# Patient Record
Sex: Male | Born: 1950 | Race: Black or African American | Hispanic: No | Marital: Married | Smoking: Never smoker
Health system: Southern US, Community
[De-identification: ages and names within clinical notes are randomized; demographics above are authoritative.]

## PROBLEM LIST (undated history)

## (undated) DIAGNOSIS — G4730 Sleep apnea, unspecified: Secondary | ICD-10-CM

## (undated) DIAGNOSIS — H269 Unspecified cataract: Secondary | ICD-10-CM

## (undated) DIAGNOSIS — E119 Type 2 diabetes mellitus without complications: Secondary | ICD-10-CM

## (undated) DIAGNOSIS — E559 Vitamin D deficiency, unspecified: Secondary | ICD-10-CM

## (undated) DIAGNOSIS — I10 Essential (primary) hypertension: Secondary | ICD-10-CM

## (undated) DIAGNOSIS — K219 Gastro-esophageal reflux disease without esophagitis: Secondary | ICD-10-CM

## (undated) DIAGNOSIS — T7840XA Allergy, unspecified, initial encounter: Secondary | ICD-10-CM

## (undated) DIAGNOSIS — E785 Hyperlipidemia, unspecified: Secondary | ICD-10-CM

## (undated) DIAGNOSIS — C61 Malignant neoplasm of prostate: Secondary | ICD-10-CM

## (undated) DIAGNOSIS — R609 Edema, unspecified: Secondary | ICD-10-CM

## (undated) DIAGNOSIS — E669 Obesity, unspecified: Secondary | ICD-10-CM

## (undated) DIAGNOSIS — F529 Unspecified sexual dysfunction not due to a substance or known physiological condition: Secondary | ICD-10-CM

## (undated) HISTORY — DX: Unspecified cataract: H26.9

## (undated) HISTORY — DX: Sleep apnea, unspecified: G47.30

## (undated) HISTORY — DX: Obesity, unspecified: E66.9

## (undated) HISTORY — DX: Allergy, unspecified, initial encounter: T78.40XA

## (undated) HISTORY — PX: PROSTATE BIOPSY: SHX241

## (undated) HISTORY — PX: OTHER SURGICAL HISTORY: SHX169

## (undated) HISTORY — PX: POLYPECTOMY: SHX149

## (undated) HISTORY — DX: Edema, unspecified: R60.9

## (undated) HISTORY — DX: Essential (primary) hypertension: I10

## (undated) HISTORY — DX: Gastro-esophageal reflux disease without esophagitis: K21.9

## (undated) HISTORY — DX: Type 2 diabetes mellitus without complications: E11.9

## (undated) HISTORY — PX: COLONOSCOPY: SHX174

## (undated) HISTORY — DX: Unspecified sexual dysfunction not due to a substance or known physiological condition: F52.9

## (undated) HISTORY — DX: Vitamin D deficiency, unspecified: E55.9

## (undated) HISTORY — DX: Hyperlipidemia, unspecified: E78.5

---
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
## 1985-08-06 HISTORY — PX: HERNIA REPAIR: SHX51

## 2005-03-06 DIAGNOSIS — R0681 Apnea, not elsewhere classified: Secondary | ICD-10-CM | POA: Insufficient documentation

## 2005-03-22 ENCOUNTER — Ambulatory Visit: Payer: Self-pay | Admitting: Pulmonary Disease

## 2005-04-16 ENCOUNTER — Encounter: Payer: Self-pay | Admitting: Pulmonary Disease

## 2005-04-16 ENCOUNTER — Ambulatory Visit (HOSPITAL_BASED_OUTPATIENT_CLINIC_OR_DEPARTMENT_OTHER): Admission: RE | Admit: 2005-04-16 | Discharge: 2005-04-16 | Payer: Self-pay | Admitting: Pulmonary Disease

## 2005-04-19 ENCOUNTER — Ambulatory Visit: Payer: Self-pay | Admitting: Pulmonary Disease

## 2005-05-10 ENCOUNTER — Ambulatory Visit: Payer: Self-pay | Admitting: Pulmonary Disease

## 2005-06-08 ENCOUNTER — Ambulatory Visit: Payer: Self-pay | Admitting: Pulmonary Disease

## 2005-06-18 ENCOUNTER — Encounter: Payer: Self-pay | Admitting: Pulmonary Disease

## 2005-07-03 DIAGNOSIS — G4730 Sleep apnea, unspecified: Secondary | ICD-10-CM | POA: Insufficient documentation

## 2005-07-03 DIAGNOSIS — J00 Acute nasopharyngitis [common cold]: Secondary | ICD-10-CM | POA: Insufficient documentation

## 2007-02-11 DIAGNOSIS — E119 Type 2 diabetes mellitus without complications: Secondary | ICD-10-CM | POA: Insufficient documentation

## 2007-10-22 DIAGNOSIS — K219 Gastro-esophageal reflux disease without esophagitis: Secondary | ICD-10-CM | POA: Insufficient documentation

## 2007-10-22 DIAGNOSIS — R079 Chest pain, unspecified: Secondary | ICD-10-CM | POA: Insufficient documentation

## 2007-10-22 DIAGNOSIS — R21 Rash and other nonspecific skin eruption: Secondary | ICD-10-CM | POA: Insufficient documentation

## 2007-11-10 ENCOUNTER — Ambulatory Visit: Payer: Self-pay | Admitting: Cardiology

## 2007-11-21 ENCOUNTER — Ambulatory Visit: Payer: Self-pay

## 2007-12-02 ENCOUNTER — Ambulatory Visit: Payer: Self-pay | Admitting: Cardiology

## 2008-03-12 DIAGNOSIS — E669 Obesity, unspecified: Secondary | ICD-10-CM | POA: Insufficient documentation

## 2009-02-14 ENCOUNTER — Encounter: Admission: RE | Admit: 2009-02-14 | Discharge: 2009-02-14 | Payer: Self-pay | Admitting: Internal Medicine

## 2009-02-14 DIAGNOSIS — R609 Edema, unspecified: Secondary | ICD-10-CM | POA: Insufficient documentation

## 2009-02-25 ENCOUNTER — Encounter: Payer: Self-pay | Admitting: Internal Medicine

## 2009-02-25 ENCOUNTER — Ambulatory Visit: Payer: Self-pay

## 2009-03-04 DIAGNOSIS — E559 Vitamin D deficiency, unspecified: Secondary | ICD-10-CM | POA: Insufficient documentation

## 2009-03-15 DIAGNOSIS — I10 Essential (primary) hypertension: Secondary | ICD-10-CM | POA: Insufficient documentation

## 2009-03-16 ENCOUNTER — Ambulatory Visit: Payer: Self-pay | Admitting: Pulmonary Disease

## 2009-03-16 DIAGNOSIS — G4733 Obstructive sleep apnea (adult) (pediatric): Secondary | ICD-10-CM | POA: Insufficient documentation

## 2009-03-16 DIAGNOSIS — E785 Hyperlipidemia, unspecified: Secondary | ICD-10-CM | POA: Insufficient documentation

## 2009-06-11 ENCOUNTER — Encounter: Payer: Self-pay | Admitting: Pulmonary Disease

## 2010-03-07 DIAGNOSIS — F529 Unspecified sexual dysfunction not due to a substance or known physiological condition: Secondary | ICD-10-CM | POA: Insufficient documentation

## 2010-03-07 DIAGNOSIS — Z0000 Encounter for general adult medical examination without abnormal findings: Secondary | ICD-10-CM | POA: Insufficient documentation

## 2010-03-16 ENCOUNTER — Ambulatory Visit: Payer: Self-pay | Admitting: Pulmonary Disease

## 2010-09-05 NOTE — Assessment & Plan Note (Signed)
Summary: rov for osa   Copy to:  Allena Katz Primary Provider/Referring Provider:  [facility]  CC:  Pt is here for a 1 yr f/u appt on his OSA.  Pt states he is not wearing his cpap machine because he states his wife says he no longer snores at night while sleeping. Marland Kitchen  History of Present Illness: The pt comes in today for f/u of his known osa.  He has been on cpap in the past, but has been working aggressively on weight loss.  He got to the point where the cpap pressure was keeping him awake, and stopped using the device.  He has actually lost 70 pounds since last visit, and is down to near ideal body weight.  He is sleeping well, and denies any alertness issues during the day.  His wife tells him that he no longer snores.  Current Medications (verified): 1)  Hydrochlorothiazide 25 Mg Tabs (Hydrochlorothiazide) .... Take 1 1/2 Tabs By Mouth Daily 2)  Simvastatin 40 Mg Tabs (Simvastatin) .... Take 1 Tablet By Mouth Once A Day 3)  Pantoprazole Sodium 40 Mg Tbec (Pantoprazole Sodium) .... Take 1 Tablet By Mouth Once A Day 4)  Enalapril Maleate 5 Mg Tabs (Enalapril Maleate) .... Take 1 Tablet By Mouth Once A Day 5)  Aspirin Ec Low Dose 81 Mg Tbec (Aspirin) .... Take 1 Tablet By Mouth Once A Day 6)  Metformin Hcl 500 Mg Tabs (Metformin Hcl) .... Take 1 Tablet By Mouth Two Times A Day 7)  Vitamin D .... Take 1 Tablet By Mouth Once A Day  Allergies (verified): No Known Drug Allergies  Review of Systems  The patient denies shortness of breath with activity, shortness of breath at rest, productive cough, non-productive cough, coughing up blood, chest pain, irregular heartbeats, acid heartburn, indigestion, loss of appetite, weight change, abdominal pain, difficulty swallowing, sore throat, tooth/dental problems, headaches, nasal congestion/difficulty breathing through nose, sneezing, itching, ear ache, anxiety, depression, hand/feet swelling, joint stiffness or pain, rash, change in color  of mucus, and fever.    Vital Signs:  Patient profile:   60 year old male Height:      74 inches Weight:      202.50 pounds BMI:     26.09 O2 Sat:      96 % on Room air Temp:     97.8 degrees F oral Pulse rate:   71 / minute BP sitting:   110 / 78  (left arm) Cuff size:   regular  Vitals Entered By: Arman Filter LPN (March 16, 2010 1:33 PM)  O2 Flow:  Room air CC: Pt is here for a 1 yr f/u appt on his OSA.  Pt states he is not wearing his cpap machine because he states his wife says he no longer snores at night while sleeping.  Comments Medications reviewed with patient Arman Filter LPN  March 16, 2010 1:33 PM    Physical Exam  General:  wd male in nad Nose:  no skin breakdown or pressure necrosis from cpap mask Extremities:  no edema or cyanosis Neurologic:  awake, alert, moves all 4.    Impression & Recommendations:  Problem # 1:  OBSTRUCTIVE SLEEP APNEA (ICD-327.23)  the pt has lost down to his ideal body weight, and is no longer snoring or having daytime alertness issues off cpap.  I have told him it is ok to stay off the device, and to followup with me as needed.  However, if he begins  to gain weight back, or if symptoms return, he is to call me.    Medications Added to Medication List This Visit: 1)  Simvastatin 40 Mg Tabs (Simvastatin) .... Take 1 tablet by mouth once a day 2)  Metformin Hcl 500 Mg Tabs (Metformin hcl) .... Take 1 tablet by mouth two times a day 3)  Vitamin D  .... Take 1 tablet by mouth once a day  Other Orders: Est. Patient Level III (16109)  Patient Instructions: 1)  can stop using cpap 2)  continue to work on conditioning 3)  followup with me as needed or if increased symptoms.

## 2010-12-14 DIAGNOSIS — R609 Edema, unspecified: Secondary | ICD-10-CM

## 2010-12-14 DIAGNOSIS — E559 Vitamin D deficiency, unspecified: Secondary | ICD-10-CM

## 2010-12-14 DIAGNOSIS — G4730 Sleep apnea, unspecified: Secondary | ICD-10-CM

## 2010-12-14 DIAGNOSIS — R21 Rash and other nonspecific skin eruption: Secondary | ICD-10-CM

## 2010-12-14 DIAGNOSIS — K219 Gastro-esophageal reflux disease without esophagitis: Secondary | ICD-10-CM

## 2010-12-14 DIAGNOSIS — F529 Unspecified sexual dysfunction not due to a substance or known physiological condition: Secondary | ICD-10-CM

## 2010-12-14 DIAGNOSIS — R0681 Apnea, not elsewhere classified: Secondary | ICD-10-CM

## 2010-12-14 DIAGNOSIS — E669 Obesity, unspecified: Secondary | ICD-10-CM

## 2010-12-14 DIAGNOSIS — R079 Chest pain, unspecified: Secondary | ICD-10-CM

## 2010-12-14 DIAGNOSIS — J00 Acute nasopharyngitis [common cold]: Secondary | ICD-10-CM

## 2010-12-14 DIAGNOSIS — E119 Type 2 diabetes mellitus without complications: Secondary | ICD-10-CM

## 2010-12-19 NOTE — Assessment & Plan Note (Signed)
[facility]                            CARDIOLOGY OFFICE NOTE   NAME:Ramiro, EATHON VALADE                       MRN:          [id number]  DATE:12/02/2007                            DOB:          Apr 17, 1951    Mr. Febles comes in today for further assessment of his chest discomfort.   His stress Myoview showed him to exercise for 7 minutes with some  minimal ST-segment changes.  Peak heart rate is 153.  Maximum blood  pressure was 189.  MET level achieved was 8.5.  EF 69%, normal  contractility and thickening in all areas of myocardium.  No definite  scar or ischemia.   His blood pressure is much better today.   I had a long talk with him today about risk factor modification,  particularly now clearance to walk 3 hours a week.  I have asked him to  try and lose 2 to 3 pounds and a month over the next year which would be  about 35-40 pounds.  This would make a significant improvement in his  overall health and future risk developing cardiovascular and other  vascular disease, not to mention diabetes, et Karie Soda.   All questions were answered.  Will plan on seeing him back again p.r.n.     Thomas C. Daleen Squibb, MD, [facility]  Electronically Signed    TCW/MedQ  DD: 12/02/2007  DT: 12/02/2007  Job #: [id number]   cc:   Samara Snide, MD

## 2010-12-19 NOTE — Assessment & Plan Note (Signed)
HEALTHCARE                            CARDIOLOGY OFFICE NOTE   NAME:Rollinson, AYMAN BRULL                       MRN:          [id number]  DATE:11/10/2007                            DOB:          August 09, 1950    I was asked by Dr. Chaney Born to evaluate Atthew Coutant, a delightful  60 year old gentleman with a severe episode of chest pain on October 11, 2007.   At the time, he was sitting at work and had this substernal chest  pressure that hit him hard.  He said he never had anything like this  before.  He denied any nausea, vomiting, diaphoresis or shortness of  breath.   Since that time, he had no further event.  He does have a history  gastroesophageal reflux.  He said this was much worse.   He has multiple cardiac risk factors including age, sex, obesity,  hypertension, hyperlipidemia.  He is also sedentary and rarely  exercises.  He has a sedentary job.   PAST MEDICAL HISTORY:   ALLERGIES:  He has no history of dye reaction or allergy.  He has known  drug allergies.   MEDICATIONS:  1. Hydrochlorothiazide 12.5 mg a day.  2. CPAP nightly for sleep apnea.  3. Enteric-coated aspirin 81 mg a day.  4. Simvastatin 40 mg nightly.  5. Pantoprazole 40 mg a day.  6. He has been carrying sublingual nitroglycerin which has not had to      use.   SOCIAL HISTORY:  He does not drink or smoke.  He does have a couple of  caffeinated beverages a day.  As I said, he does not exercise.  Occupation:  He is a Scientist, research (medical).  He is married.  He has no  children.   PAST SURGICAL HISTORY:  1. Cataracts at age 48.  2. Hernia on the right side repair.   FAMILY HISTORY:  Negative for premature coronary disease.  He, however,  has a very strong history of diabetes.   REVIEW OF SYSTEMS:  He has a history of gastroesophageal reflux and  sleep apnea.  He wears CPAP.  His other Review of Systems were all  questions and are negative.   PHYSICAL EXAMINATION:   GENERAL:  He is very pleasant.  He has a great  sense of humor.  VITAL SIGNS:  He is 6 feet 2 inches.  He weighs 265 pounds.  His blood  pressure 144/86, pulse 77 and regular.  HEENT:  Normocephalic, atraumatic.  PERRLA.  Extraocular movements  intact.  Sclerae are clear.  He wears glasses.  Facial symmetry is  normal.  He has a mustache. Dentition satisfactory.  NECK:  Supple.  Carotids were equal bilaterally without bruits, no JVD.  Thyroid is not enlarged.  Trachea is midline.  LUNGS:  Clear.  HEART:  Reveals a poorly appreciated PMI.  There is normal S1-S2.  ABDOMEN:  Protuberant, good bowel sounds.  No midline bruit.  No obvious  organomegaly.  EXTREMITIES:  Reveal 2+ pitting edema on the right, 1+ on the left.  There is no sign of DVT.  There is no tenderness.  There is no skin  discoloration. Pulses were 2+/4+ bilaterally symmetrical.  NEUROLOGIC:  Exam is intact.  SKIN:  Unremarkable.   Electrocardiogram shows sinus rhythm, nonspecific T-wave changes.   ASSESSMENT/PLAN:  I think Mr. Smead is at significant risk for an acute  coronary syndrome.  He probably has nonobstructive plaque, and his  symptoms are probably not related to the spell he had.  However, it was  dramatic and certainly did not resemble his reflux.   I have spent most of our time today talking about cardiac risk factor  modification.  He is on a statin.  He is on an aspirin, and his blood  pressure is probably suboptimally controlled.  His edema could be made  better by switching to furosemide or a stronger diuretic.  He probably  also needs an ACE inhibitor for blood pressure control.  I think he is  high risk for developing diabetes down the road.  We talked about an  exercise program, but before he did that, I would want to get an  exercise rest stress Myoview.  He agrees to this plan.  We will arrange  for this in the next week or so.     Thomas C. Daleen Squibb, MD, [facility]  Electronically Signed    TCW/MedQ   DD: 11/10/2007  DT: 11/10/2007  Job #: [id number]   cc:   Samara Snide, MD

## 2010-12-22 NOTE — Procedures (Signed)
Raymond [name], Raymond [name] NO.:  [id number]   MEDICAL RECORD NO.:  [id number]          PATIENT TYPE:  OUT   LOCATION:  SLEEP CENTER                 FACILITY:  [facility]   PHYSICIAN:  Marcelyn Bruins, M.D. [facility] DATE OF BIRTH:  07/25/1951   DATE OF STUDY:  04/16/2005                              NOCTURNAL POLYSOMNOGRAM   REFERRING PHYSICIAN:  Marcelyn Bruins, M.D.   DATE OF STUDY:  April 16, 2005.   INDICATION FOR STUDY:  Hypersomnia with sleep apnea. Epworth score: 12.   SLEEP ARCHITECTURE:  The patient had total sleep time of 408 minutes with  essentially no slow wave sleep and decreased REM. Sleep onset latency was  normal as was REM onset. Sleep efficiency was 93%.   RESPIRATORY DATA:  The patient was found to have a respiratory disturbance  index of 31 events per hour with moderate to very loud snoring noted. Events  were not positional.   OXYGEN DATA:  The patient had O2 desaturation as low as 70% with his  obstructive events.   CARDIAC DATA:  No clinically significant cardiac arrhythmias.   MOVEMENT/PARASOMNIAS:  No clinically significant periodic leg movements.   IMPRESSION:  Moderate obstructive sleep apnea/hypopnea syndrome. Treatment  for this degree of sleep apnea may include weight loss, upper airway  surgery, oral appliance, or C-PAP. Clinical correlation is suggested.           ______________________________  Marcelyn Bruins, M.D. [facility]  Diplomate, American Board of Sleep  Medicine     KC/MEDQ  D:  04/19/2005 15:23:45  T:  04/19/2005 21:54:32  Job:  [id number]

## 2012-04-11 ENCOUNTER — Encounter: Payer: Self-pay | Admitting: Gastroenterology

## 2012-10-28 ENCOUNTER — Other Ambulatory Visit: Payer: Self-pay | Admitting: *Deleted

## 2012-10-28 DIAGNOSIS — I10 Essential (primary) hypertension: Secondary | ICD-10-CM

## 2012-10-28 DIAGNOSIS — E119 Type 2 diabetes mellitus without complications: Secondary | ICD-10-CM

## 2012-10-28 DIAGNOSIS — E785 Hyperlipidemia, unspecified: Secondary | ICD-10-CM

## 2012-11-13 ENCOUNTER — Other Ambulatory Visit: Payer: Managed Care, Other (non HMO)

## 2012-11-13 DIAGNOSIS — E119 Type 2 diabetes mellitus without complications: Secondary | ICD-10-CM

## 2012-11-13 DIAGNOSIS — I10 Essential (primary) hypertension: Secondary | ICD-10-CM

## 2012-11-13 DIAGNOSIS — E785 Hyperlipidemia, unspecified: Secondary | ICD-10-CM

## 2012-11-14 ENCOUNTER — Encounter: Payer: Self-pay | Admitting: Geriatric Medicine

## 2012-11-14 LAB — HEMOGLOBIN A1C
Est. average glucose Bld gHb Est-mCnc: 140 mg/dL
Hgb A1c MFr Bld: 6.5 % — ABNORMAL HIGH (ref 4.8–5.6)

## 2012-11-14 LAB — COMPREHENSIVE METABOLIC PANEL
ALT: 26 IU/L (ref 0–44)
AST: 22 IU/L (ref 0–40)
Albumin/Globulin Ratio: 1.7 (ref 1.1–2.5)
Albumin: 4.5 g/dL (ref 3.6–4.8)
Alkaline Phosphatase: 67 IU/L (ref 39–117)
BUN/Creatinine Ratio: 12 (ref 10–22)
BUN: 13 mg/dL (ref 8–27)
CO2: 24 mmol/L (ref 19–28)
Calcium: 9.4 mg/dL (ref 8.6–10.2)
Chloride: 102 mmol/L (ref 97–108)
Creatinine, Ser: 1.1 mg/dL (ref 0.76–1.27)
GFR calc Af Amer: 84 mL/min/{1.73_m2} (ref >59)
GFR calc non Af Amer: 73 mL/min/{1.73_m2} (ref >59)
Globulin, Total: 2.6 g/dL (ref 1.5–4.5)
Glucose: 101 mg/dL — ABNORMAL HIGH (ref 65–99)
Potassium: 4.5 mmol/L (ref 3.5–5.2)
Sodium: 139 mmol/L (ref 134–144)
Total Bilirubin: 0.6 mg/dL (ref 0.0–1.2)
Total Protein: 7.1 g/dL (ref 6.0–8.5)

## 2012-11-14 LAB — LIPID PANEL
Chol/HDL Ratio: 3.9 ratio units (ref 0.0–5.0)
Cholesterol, Total: 114 mg/dL (ref 100–199)
HDL: 29 mg/dL — ABNORMAL LOW (ref >39)
LDL Calculated: 60 mg/dL (ref 0–99)
Triglycerides: 124 mg/dL (ref 0–149)
VLDL Cholesterol Cal: 25 mg/dL (ref 5–40)

## 2012-11-14 LAB — MICROALBUMIN / CREATININE URINE RATIO
Creatinine, Ur: 210.9 mg/dL (ref 22.0–328.0)
MICROALB/CREAT RATIO: 0.9 mg/g creat (ref 0.0–30.0)
Microalbumin, Urine: 2 ug/mL (ref 0.0–17.0)

## 2012-11-17 ENCOUNTER — Encounter: Payer: Self-pay | Admitting: Pharmacotherapy

## 2012-11-17 ENCOUNTER — Ambulatory Visit (INDEPENDENT_AMBULATORY_CARE_PROVIDER_SITE_OTHER): Payer: Managed Care, Other (non HMO) | Admitting: Pharmacotherapy

## 2012-11-17 VITALS — BP 116/78 | HR 64 | Temp 97.6°F | Wt 247.8 lb

## 2012-11-17 DIAGNOSIS — E785 Hyperlipidemia, unspecified: Secondary | ICD-10-CM

## 2012-11-17 DIAGNOSIS — I10 Essential (primary) hypertension: Secondary | ICD-10-CM

## 2012-11-17 DIAGNOSIS — E119 Type 2 diabetes mellitus without complications: Secondary | ICD-10-CM

## 2012-11-17 NOTE — Addendum Note (Signed)
Addended by: Edison Pace on: 11/17/2012 01:17 PM   Modules accepted: Orders

## 2012-11-17 NOTE — Progress Notes (Signed)
  Subjective:    Raymond [name] is a 62 y.o. male who presents for follow-up of Type 2 diabetes mellitus.   A1C:  6.5% Average BG:  106mg /dl (did bring meter to OV) No hypoglycemia. No problems with metformin  Trying to make healthy food choices.  No skipping meals.  Does eat late at times. Not much exercise.  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 Denies problems with feet.  No sores.  Checks feet daily. Denies problems with vision.  Some dryness in the morning.  Last eye exam was January 2014. Nocturia 1-2 times per night.  Review of Systems A comprehensive review of systems was negative except for: Eyes: positive for dryness in the morning Genitourinary: positive for nocturia    Objective:    BP 116/78  Pulse 64  Temp(Src) 97.6 F (36.4 C)  Wt 247 lb 12.8 oz (112.401 kg)  BMI 31.8 kg/m2  General:  alert, cooperative, appears stated age, no distress and mildly obese  Oropharynx: normal findings: lips normal without lesions and gums healthy   Eyes:  negative   Ears:  external ears normal        Lung: clear to auscultation bilaterally  Heart:  regular rate and rhythm     Extremities: extremities normal, atraumatic, no cyanosis or edema  Skin: warm and dry, no hyperpigmentation, vitiligo, or suspicious lesions     Neuro: mental status, speech normal, alert and oriented x3 and gait and station normal   Lab Review Glucose (mg/dL)  Date Value  1/47/8295 101*     CO2 (mmol/L)  Date Value  11/13/2012 24      BUN (mg/dL)  Date Value  01/24/3085 13      Creatinine, Ser (mg/dL)  Date Value  5/78/4696 1.10     A1C:  6.5% Microalbumin:  2.0 LFT:  WNL Total cholesterol:  114 LDL:  60 HDL:  29 TG:  124  Assessment:    Diabetes Mellitus type II, under excellent control.  A1C at goal <7%  BP goal is <140/80  LDL goal <100l.    Plan:    1.  Rx changes: none 2.  Continue Metformin. 3.  Counseled on nutrition goals. 4.   Counseled on need for routine exercise.  Goal is 30-45 minutes 5 x week. 5.  BP at goal <140/80 on current medication. 6.  LDL is at goal <100 on atorvastatin 80mg  daily without myalgias.   7.  HDL is low at 29 - continue Niaspan.  Explained how routine exercise may help to improve HDL levels. 8.  RTC in 6 months.  Lab prior:  A1C, CMP, FLP, microalbumin.

## 2012-11-24 ENCOUNTER — Encounter: Payer: Self-pay | Admitting: Pharmacotherapy

## 2012-12-12 ENCOUNTER — Other Ambulatory Visit: Payer: Self-pay | Admitting: *Deleted

## 2012-12-12 DIAGNOSIS — E785 Hyperlipidemia, unspecified: Secondary | ICD-10-CM

## 2012-12-12 DIAGNOSIS — I10 Essential (primary) hypertension: Secondary | ICD-10-CM

## 2012-12-12 DIAGNOSIS — E1059 Type 1 diabetes mellitus with other circulatory complications: Secondary | ICD-10-CM

## 2012-12-29 ENCOUNTER — Other Ambulatory Visit: Payer: Self-pay | Admitting: Internal Medicine

## 2012-12-30 ENCOUNTER — Other Ambulatory Visit: Payer: Self-pay | Admitting: Internal Medicine

## 2013-01-01 ENCOUNTER — Encounter: Payer: Self-pay | Admitting: Gastroenterology

## 2013-01-14 ENCOUNTER — Other Ambulatory Visit: Payer: Self-pay | Admitting: Internal Medicine

## 2013-01-14 ENCOUNTER — Other Ambulatory Visit: Payer: Self-pay | Admitting: Geriatric Medicine

## 2013-01-14 MED ORDER — METFORMIN HCL 500 MG PO TABS: ORAL_TABLET | ORAL | Status: DC

## 2013-02-20 ENCOUNTER — Other Ambulatory Visit: Payer: Self-pay

## 2013-03-23 ENCOUNTER — Other Ambulatory Visit: Payer: Self-pay

## 2013-03-25 ENCOUNTER — Ambulatory Visit: Payer: Self-pay | Admitting: Internal Medicine

## 2013-03-27 ENCOUNTER — Other Ambulatory Visit: Payer: Managed Care, Other (non HMO)

## 2013-03-30 ENCOUNTER — Other Ambulatory Visit: Payer: Managed Care, Other (non HMO)

## 2013-03-30 DIAGNOSIS — I10 Essential (primary) hypertension: Secondary | ICD-10-CM

## 2013-03-30 DIAGNOSIS — E1059 Type 1 diabetes mellitus with other circulatory complications: Secondary | ICD-10-CM

## 2013-03-30 DIAGNOSIS — E785 Hyperlipidemia, unspecified: Secondary | ICD-10-CM

## 2013-03-31 ENCOUNTER — Ambulatory Visit: Payer: Self-pay | Admitting: Internal Medicine

## 2013-03-31 LAB — BASIC METABOLIC PANEL
Calcium: 8.9 mg/dL (ref 8.6–10.2)
Creatinine, Ser: 0.93 mg/dL (ref 0.76–1.27)
GFR calc Af Amer: 102 mL/min/{1.73_m2} (ref >59)
GFR calc non Af Amer: 88 mL/min/{1.73_m2} (ref >59)
Sodium: 139 mmol/L (ref 134–144)

## 2013-03-31 LAB — HEMOGLOBIN A1C: Hgb A1c MFr Bld: 6.6 % — ABNORMAL HIGH (ref 4.8–5.6)

## 2013-04-01 ENCOUNTER — Encounter: Payer: Self-pay | Admitting: Internal Medicine

## 2013-04-01 ENCOUNTER — Ambulatory Visit (INDEPENDENT_AMBULATORY_CARE_PROVIDER_SITE_OTHER): Payer: Managed Care, Other (non HMO) | Admitting: Internal Medicine

## 2013-04-01 VITALS — BP 126/84 | HR 60 | Ht 72.0 in | Wt 250.0 lb

## 2013-04-01 DIAGNOSIS — I10 Essential (primary) hypertension: Secondary | ICD-10-CM

## 2013-04-01 DIAGNOSIS — E559 Vitamin D deficiency, unspecified: Secondary | ICD-10-CM

## 2013-04-01 DIAGNOSIS — E669 Obesity, unspecified: Secondary | ICD-10-CM

## 2013-04-01 DIAGNOSIS — E1165 Type 2 diabetes mellitus with hyperglycemia: Secondary | ICD-10-CM | POA: Insufficient documentation

## 2013-04-01 DIAGNOSIS — E785 Hyperlipidemia, unspecified: Secondary | ICD-10-CM

## 2013-04-01 DIAGNOSIS — Z23 Encounter for immunization: Secondary | ICD-10-CM

## 2013-04-01 DIAGNOSIS — E119 Type 2 diabetes mellitus without complications: Secondary | ICD-10-CM

## 2013-04-01 NOTE — Progress Notes (Signed)
Patient ID: Raymond [name], male   DOB: 12/21/1950, 62 y.o.   MRN: [id number]  Chief Complaint  Patient presents with  . Follow-up    6 month follow-up    No Known Allergies  HPI 62 y/o male patient is here for routine follow up visit. He denies any complaints. He has not taken his niacin. He has been compliant with other medications. He has gained 3 lbs since last OV. He has not been exercising recently. He tries to be careful with his meals  Review of Systems  Constitutional: Negative for fever, chills, malaise/fatigue and diaphoresis.  HENT: Negative for congestion.   Eyes: Negative for blurred vision and double vision.  Respiratory: Negative for cough and shortness of breath.   Cardiovascular: Negative for chest pain, palpitations and leg swelling.  Gastrointestinal: Negative for heartburn, nausea, vomiting, abdominal pain, diarrhea and constipation.  Genitourinary: Negative for dysuria and flank pain.  Musculoskeletal: Negative for myalgias, joint pain and falls.  Skin: Negative for itching and rash.  Neurological: Negative for seizures and headaches.  Psychiatric/Behavioral: Negative for depression and memory loss. The patient is not nervous/anxious.     Past Medical History  Diagnosis Date  . Problems with sexual function   . Unspecified vitamin D deficiency   . Edema   . Obesity, unspecified   . GERD (gastroesophageal reflux disease)   . Diabetes mellitus without complication   . Hyperlipidemia   . Hypertension   . Unspecified sleep apnea    History reviewed. No pertinent past surgical history.  Current Outpatient Prescriptions on File Prior to Visit  Medication Sig Dispense Refill  . aspirin 81 MG tablet Take 81 mg by mouth daily.        Marland Kitchen atorvastatin (LIPITOR) 80 MG tablet Take 1 tablet by mouth daily. Take 1 tablet daily      . cholecalciferol (VITAMIN D) 1000 UNITS tablet Take 1,000 Units by mouth daily. Take 1 tablet daily      . enalapril (VASOTEC) 5 MG tablet  TAKE 1 TABLET EVERY DAY FOR KIDNEYS  30 tablet  6  . glucose blood test strip 1 each 2 (two) times daily. To self monitor blood glucose       . metFORMIN (GLUCOPHAGE) 500 MG tablet Take one tablet by mouth once daily for diabetes.  30 tablet  5   No current facility-administered medications on file prior to visit.    Physical exam  BP 126/84  Pulse 60  Ht 6' (1.829 m)  Wt 250 lb (113.399 kg)  BMI 33.9 kg/m2  SpO2 95%  gen- adult, overweight male in NAD HEENT- no pallor, no icterus, no LAD, MMM cvs- ns 1,s2, rrr respi- CTAB, no wheeze or rhonchi or crackles abdo- bs+, soft, non tender Ext- no edema, no sores/ ulcers, able to move all 4 Neuro- no focal defecit Psych- mood appropriate  Labs-  CMP     Component Value Date/Time   NA 139 03/30/2013 0807   K 4.4 03/30/2013 0807   CL 103 03/30/2013 0807   CO2 21 03/30/2013 0807   GLUCOSE 108* 03/30/2013 0807   BUN 18 03/30/2013 0807   CREATININE 0.93 03/30/2013 0807   CALCIUM 8.9 03/30/2013 0807   PROT 7.1 11/13/2012 0833   AST 22 11/13/2012 0833   ALT 26 11/13/2012 0833   ALKPHOS 67 11/13/2012 0833   BILITOT 0.6 11/13/2012 0833   GFRNONAA 88 03/30/2013 0807   GFRAA 102 03/30/2013 0807   03/30/13 a1c 6.6  Assessment/plan  Dm type 2- continue metformin. a1c at goal. uptodate with eye and foot exam. bp well controlled. ldl at goal. Continue asa and statin with enalapril  HTN- bp well controlled. Continue enalapril for now  Hyperlipidemia- continue lipitor, reviewed flp  Vit d def- continue vit d supplement  Obesity- encouraged routine exercise and weight loss

## 2013-05-16 ENCOUNTER — Other Ambulatory Visit: Payer: Self-pay | Admitting: Nurse Practitioner

## 2013-05-18 ENCOUNTER — Ambulatory Visit: Payer: Managed Care, Other (non HMO) | Admitting: Pharmacotherapy

## 2013-07-12 ENCOUNTER — Other Ambulatory Visit: Payer: Self-pay | Admitting: Internal Medicine

## 2013-07-15 ENCOUNTER — Other Ambulatory Visit: Payer: Managed Care, Other (non HMO)

## 2013-07-15 ENCOUNTER — Ambulatory Visit (INDEPENDENT_AMBULATORY_CARE_PROVIDER_SITE_OTHER): Payer: Managed Care, Other (non HMO) | Admitting: Internal Medicine

## 2013-07-15 ENCOUNTER — Encounter: Payer: Self-pay | Admitting: Internal Medicine

## 2013-07-15 VITALS — BP 124/82 | HR 63 | Temp 97.1°F | Wt 248.0 lb

## 2013-07-15 DIAGNOSIS — E119 Type 2 diabetes mellitus without complications: Secondary | ICD-10-CM

## 2013-07-15 DIAGNOSIS — M79606 Pain in leg, unspecified: Secondary | ICD-10-CM | POA: Insufficient documentation

## 2013-07-15 DIAGNOSIS — I10 Essential (primary) hypertension: Secondary | ICD-10-CM

## 2013-07-15 DIAGNOSIS — M79609 Pain in unspecified limb: Secondary | ICD-10-CM

## 2013-07-15 DIAGNOSIS — M79605 Pain in left leg: Secondary | ICD-10-CM

## 2013-07-15 DIAGNOSIS — E785 Hyperlipidemia, unspecified: Secondary | ICD-10-CM

## 2013-07-15 MED ORDER — IBUPROFEN 600 MG PO TABS: 600.0000 mg | ORAL_TABLET | Freq: Four times a day (QID) | ORAL | Status: DC

## 2013-07-15 NOTE — Progress Notes (Signed)
Patient ID: Raymond [name], male   DOB: 07-12-1951, 62 y.o.   MRN: [id number]  Chief Complaint  Patient presents with  . Acute Visit    knee pain   No Known Allergies  HPI 62 y/o male patient is here for acute visit. He has been having pain around his left knee for 1-2 weeks. He does not remember any trauma or fall. He does recall twisting his left leg few week back. The pain is intermittent, more with walking or after moving his leg after a long rest period. Denies any swelling or redness around knee Pain is non radiating Tylenol extra strength one dose taken yesterday helped some No other meds tried Compliant with other meds  ROS No fever or chills No focal weakness No numbness or tingling Able to bear weight  Past Medical History  Diagnosis Date  . Problems with sexual function   . Unspecified vitamin D deficiency   . Edema   . Obesity, unspecified   . GERD (gastroesophageal reflux disease)   . Diabetes mellitus without complication   . Hyperlipidemia   . Hypertension   . Unspecified sleep apnea    Current Outpatient Prescriptions on File Prior to Visit  Medication Sig Dispense Refill  . aspirin 81 MG tablet Take 81 mg by mouth daily.        Marland Kitchen atorvastatin (LIPITOR) 80 MG tablet TAKE 1 TABLET BY MOUTH DAILY AT BEDTIME  30 tablet  6  . cholecalciferol (VITAMIN D) 1000 UNITS tablet Take 1,000 Units by mouth daily. Take 1 tablet daily      . enalapril (VASOTEC) 5 MG tablet TAKE 1 TABLET EVERY DAY FOR KIDNEYS  30 tablet  6  . glucose blood test strip 1 each. Check blood sugar 2-3 x weekly      . metFORMIN (GLUCOPHAGE) 500 MG tablet TAKE ONE TABLET BY MOUTH ONCE DAILY FOR DIABETES.  30 tablet  0   No current facility-administered medications on file prior to visit.    Physical exam BP 124/82  Pulse 63  Temp(Src) 97.1 F (36.2 C) (Oral)  Wt 248 lb (112.492 kg)  SpO2 96%  General- adult male in no acute distress Head- atraumatic, normocephalic Eyes- PERRLA, EOMI, no  pallor, no icterus Cardiovascular- normal s1,s2, no murmurs/ rubs/ gallops Respiratory- bilateral clear to auscultation, no wheeze, no rhonchi, no crackles Abdomen- bowel sounds present, soft, non tender Musculoskeletal- able to move all 4 extremities, limping gait, has pain in quadriceps medially with extension. Drawer test negative. Valgus and varus test negative. No joint line tenderness. No patellar tenderness. mcmurray negative.  Neurological- no focal deficit, normal reflexes, normal muscle strength, normal sensation to fine touch and vibration Psychiatry- alert and oriented to person, place and time, normal mood and affect  Assessment/plan  Left leg musculoskeletal pain- likely has knee extensor tendinitis- will have him take rest, elevated leg, use ice pack prn and ibuprofen 600 mg q8h x 5 days and then prn to help with the inflammation. Reassess if no improvement  htn- bp stable, continue current regimen, monitor clincially

## 2013-07-16 LAB — LIPID PANEL
HDL: 27 mg/dL — ABNORMAL LOW (ref >39)
Triglycerides: 100 mg/dL (ref 0–149)

## 2013-07-16 LAB — COMPREHENSIVE METABOLIC PANEL
ALT: 29 IU/L (ref 0–44)
AST: 22 IU/L (ref 0–40)
CO2: 17 mmol/L — ABNORMAL LOW (ref 18–29)
Calcium: 9.2 mg/dL (ref 8.6–10.2)
Creatinine, Ser: 0.95 mg/dL (ref 0.76–1.27)
Glucose: 103 mg/dL — ABNORMAL HIGH (ref 65–99)
Potassium: 4.5 mmol/L (ref 3.5–5.2)
Sodium: 141 mmol/L (ref 134–144)
Total Protein: 6.7 g/dL (ref 6.0–8.5)

## 2013-07-16 LAB — HEMOGLOBIN A1C
Est. average glucose Bld gHb Est-mCnc: 137 mg/dL
Hgb A1c MFr Bld: 6.4 % — ABNORMAL HIGH (ref 4.8–5.6)

## 2013-07-21 ENCOUNTER — Other Ambulatory Visit: Payer: Managed Care, Other (non HMO)

## 2013-07-28 ENCOUNTER — Ambulatory Visit (INDEPENDENT_AMBULATORY_CARE_PROVIDER_SITE_OTHER): Payer: Managed Care, Other (non HMO) | Admitting: Internal Medicine

## 2013-07-28 VITALS — BP 118/80 | HR 68 | Temp 96.6°F | Wt 251.4 lb

## 2013-07-28 DIAGNOSIS — E785 Hyperlipidemia, unspecified: Secondary | ICD-10-CM

## 2013-07-28 DIAGNOSIS — M79606 Pain in leg, unspecified: Secondary | ICD-10-CM

## 2013-07-28 DIAGNOSIS — I10 Essential (primary) hypertension: Secondary | ICD-10-CM

## 2013-07-28 DIAGNOSIS — M79609 Pain in unspecified limb: Secondary | ICD-10-CM

## 2013-07-28 DIAGNOSIS — E119 Type 2 diabetes mellitus without complications: Secondary | ICD-10-CM

## 2013-07-28 MED ORDER — ATORVASTATIN CALCIUM 40 MG PO TABS: ORAL_TABLET | ORAL | Status: DC

## 2013-07-28 MED ORDER — ZOSTER VACCINE LIVE 19400 UNT/0.65ML ~~LOC~~ SOLR: 0.6500 mL | Freq: Once | SUBCUTANEOUS | Status: DC

## 2013-07-28 MED ORDER — TETANUS-DIPHTH-ACELL PERTUSSIS 5-2.5-18.5 LF-MCG/0.5 IM SUSP: 0.5000 mL | Freq: Once | INTRAMUSCULAR | Status: DC

## 2013-07-28 MED ORDER — METFORMIN HCL 500 MG PO TABS: 500.0000 mg | ORAL_TABLET | ORAL | Status: DC

## 2013-07-28 NOTE — Progress Notes (Signed)
Patient ID: Raymond [name], male   DOB: 10/02/1950, 62 y.o.   MRN: [id number]    Chief Complaint  Patient presents with  . Medical Managment of Chronic Issues    4 month f/u with labs printed  . Immunizations    tdap, shingles  . other    due for a repeat colonoscopy   No Known Allergies  HPI 62 y/o male patient is here for routine follow up visit. cbg in morning have been 100-112. No hypoglycemic episode He denies any complaints. He tries to be careful with his meals  Review of Systems  Constitutional: Negative for fever, chills, malaise/fatigue and diaphoresis.  HENT: Negative for congestion.   Eyes: Negative for blurred vision and double vision.  Respiratory: Negative for cough and shortness of breath.   Cardiovascular: Negative for chest pain, palpitations and leg swelling.  Gastrointestinal: Negative for heartburn, nausea, vomiting, abdominal pain, diarrhea and constipation.  Genitourinary: Negative for dysuria and flank pain.  Musculoskeletal: Negative for myalgias, joint pain and falls.  Skin: Negative for itching and rash.  Neurological: Negative for seizures and headaches.  Psychiatric/Behavioral: Negative for depression and memory loss. The patient is not nervous/anxious.   Past Medical History  Diagnosis Date  . Problems with sexual function   . Unspecified vitamin D deficiency   . Edema   . Obesity, unspecified   . GERD (gastroesophageal reflux disease)   . Diabetes mellitus without complication   . Hyperlipidemia   . Hypertension   . Unspecified sleep apnea    Past Surgical History  Procedure Laterality Date  . Cataract extraction  1964    Dr.Freed   . Hernia repair  1987    Right Side, Dr.Kaufman    Medication reviewed. See MAR  BP 118/80  Pulse 68  Temp(Src) 96.6 F (35.9 C) (Oral)  Wt 251 lb 6.4 oz (114.034 kg)  SpO2 97%   gen- adult, overweight male in NAD HEENT- no pallor, no icterus, no LAD, MMM cvs- ns 1,s2, rrr respi- CTAB, no wheeze or  rhonchi or crackles abdo- bs+, soft, non tender Ext- no edema, no sores/ ulcers, able to move all 4 Neuro- no focal defecit Psych- mood appropriate, alert and oriented  Labs- Lab Results  Component Value Date   HGBA1C 6.4* 07/15/2013    Lipid Panel     Component Value Date/Time   TRIG 100 07/15/2013 0817   HDL 27* 07/15/2013 0817   CHOLHDL 3.9 07/15/2013 0817   LDLCALC 58 07/15/2013 0817   CMP     Component Value Date/Time   NA 141 07/15/2013 0817   K 4.5 07/15/2013 0817   CL 104 07/15/2013 0817   CO2 17* 07/15/2013 0817   GLUCOSE 103* 07/15/2013 0817   BUN 18 07/15/2013 0817   CREATININE 0.95 07/15/2013 0817   CALCIUM 9.2 07/15/2013 0817   PROT 6.7 07/15/2013 0817   AST 22 07/15/2013 0817   ALT 29 07/15/2013 0817   ALKPHOS 62 07/15/2013 0817   BILITOT 0.9 07/15/2013 0817   GFRNONAA 86 07/15/2013 0817   GFRAA 99 07/15/2013 0817   Assessment/plan  Dm type 2- continue metformin but with a1c at goal, will change metformin to every other day for now. Monitor cbg. Goal a1c < 7.uptodate with eye and foot exam. bp well controlled. ldl at goal. Continue asa and statin with enalapril. check a1c  HTN- bp well controlled. Continue enalapril for now  Hyperlipidemia-  Continue lipitor and will decrease it to 40 mg daily. Check  flp prior to next visit  Musculoskeletal leg pain, unspecified laterality- imporved. Continue prn ibuprofen

## 2013-08-12 ENCOUNTER — Encounter: Payer: Self-pay | Admitting: Internal Medicine

## 2013-11-10 ENCOUNTER — Other Ambulatory Visit: Payer: Self-pay | Admitting: *Deleted

## 2013-11-10 MED ORDER — METFORMIN HCL 500 MG PO TABS: ORAL_TABLET | ORAL | Status: DC

## 2013-11-10 NOTE — Telephone Encounter (Signed)
[facility] 

## 2013-11-26 ENCOUNTER — Other Ambulatory Visit: Payer: BC Managed Care – PPO

## 2013-11-26 DIAGNOSIS — E785 Hyperlipidemia, unspecified: Secondary | ICD-10-CM

## 2013-11-26 DIAGNOSIS — E119 Type 2 diabetes mellitus without complications: Secondary | ICD-10-CM

## 2013-11-27 LAB — HEMOGLOBIN A1C
ESTIMATED AVERAGE GLUCOSE: 151 mg/dL
Hgb A1c MFr Bld: 6.9 % — ABNORMAL HIGH (ref 4.8–5.6)

## 2013-11-27 LAB — LIPID PANEL
CHOLESTEROL TOTAL: 123 mg/dL (ref 100–199)
Chol/HDL Ratio: 4.6 ratio units (ref 0.0–5.0)
HDL: 27 mg/dL — AB (ref >39)
LDL CALC: 66 mg/dL (ref 0–99)
TRIGLYCERIDES: 151 mg/dL — AB (ref 0–149)
VLDL CHOLESTEROL CAL: 30 mg/dL (ref 5–40)

## 2013-12-01 ENCOUNTER — Ambulatory Visit (INDEPENDENT_AMBULATORY_CARE_PROVIDER_SITE_OTHER): Payer: BC Managed Care – PPO | Admitting: Internal Medicine

## 2013-12-01 ENCOUNTER — Encounter: Payer: Self-pay | Admitting: Internal Medicine

## 2013-12-01 VITALS — BP 124/80 | HR 76 | Temp 98.2°F | Resp 10 | Wt 253.0 lb

## 2013-12-01 DIAGNOSIS — I10 Essential (primary) hypertension: Secondary | ICD-10-CM

## 2013-12-01 DIAGNOSIS — E785 Hyperlipidemia, unspecified: Secondary | ICD-10-CM

## 2013-12-01 DIAGNOSIS — Z23 Encounter for immunization: Secondary | ICD-10-CM

## 2013-12-01 DIAGNOSIS — E119 Type 2 diabetes mellitus without complications: Secondary | ICD-10-CM

## 2013-12-01 MED ORDER — METFORMIN HCL 500 MG PO TABS: ORAL_TABLET | ORAL | Status: DC

## 2013-12-01 NOTE — Progress Notes (Signed)
Patient ID: Raymond [name], male   DOB: [birth year], 63 y.o.   MRN: [id number]     Chief Complaint  Patient presents with  . Medical Management of Chronic Issues    4 month follow-up, discuss labs (copy printed)    No Known Allergies  HPI 63 y/o male patient is here for routine follow up visit. cbg has been 105-115 average. He has history of dm type 2, HTN, hyperlipidemia and OSA. He has come off his CPAP machine for few years now.No hypoglycemic episode He denies any complaints. He has been having sedentary lifestyle lately. He has gained few pounds.  Review of Systems   Constitutional: Negative for fever, chills, malaise/fatigue and diaphoresis. Has possible viral uri 2 weeks back mostly resolved now. HENT: Negative for congestion.    Eyes: Negative for blurred vision and double vision. Wears corrective lenses Respiratory: Negative for cough and shortness of breath   Cardiovascular: Negative for chest pain, palpitations and leg swelling.   Gastrointestinal: Negative for heartburn, nausea, vomiting, abdominal pain, diarrhea and constipation. No melena Genitourinary: Negative for dysuria and flank pain.   Musculoskeletal: Negative for myalgias, joint pain and falls.   Skin: Negative for itching and rash.   Neurological: Negative for seizures and headaches.   Psychiatric/Behavioral: Negative for depression and memory loss. The patient is not nervous/anxious.   Past Medical History  Diagnosis Date  . Problems with sexual function   . Unspecified vitamin D deficiency   . Edema   . Obesity, unspecified   . GERD (gastroesophageal reflux disease)   . Diabetes mellitus without complication   . Hyperlipidemia   . Hypertension   . Unspecified sleep apnea    Medication reviewed. See MAR  Physical exam BP 124/80  Pulse 76  Temp(Src) 98.2 F (36.8 C) (Oral)  Resp 10  Wt 253 lb (114.76 kg)  General- elderly male in no acute distress, obese Head- atraumatic, normocephalic Eyes-  PERRLA, EOMI, no pallor, no icterus, no discharge Neck- no lymphadenopathy, no thyromegaly, no jugular vein distension, no carotid bruit Nose- normal nasal mucosa, no maxillary sinus tenderness, no frontal sinus tenderness Mouth- normal mucus membrane, no oral thrush, normal oropharynx Cardiovascular- normal s1,s2, no murmurs/ rubs/ gallops, normal distal pulses Respiratory- bilateral clear to auscultation, no wheeze, no rhonchi, no crackles Abdomen- bowel sounds present, soft, non tender Musculoskeletal- able to move all 4 extremities, no spinal and paraspinal tenderness, steady gait, no use of assistive device, normal range of motion, no leg edema Neurological- no focal deficit, normal reflexes, normal muscle strength, normal sensation to fine touch and vibration Skin- warm and dry, hypertrophied right toe nails Psychiatry- alert and oriented to person, place and time, normal mood and affect  Labs- Lab Results  Component Value Date   HGBA1C 6.9* 11/26/2013   Lipid Panel     Component Value Date/Time   TRIG 151* 11/26/2013 0816   HDL 27* 11/26/2013 0816   CHOLHDL 4.6 11/26/2013 0816   LDLCALC 66 11/26/2013 0816   CMP     Component Value Date/Time   NA 141 07/15/2013 0817   K 4.5 07/15/2013 0817   CL 104 07/15/2013 0817   CO2 17* 07/15/2013 0817   GLUCOSE 103* 07/15/2013 0817   BUN 18 07/15/2013 0817   CREATININE 0.95 07/15/2013 0817   CALCIUM 9.2 07/15/2013 0817   PROT 6.7 07/15/2013 0817   AST 22 07/15/2013 0817   ALT 29 07/15/2013 0817   ALKPHOS 62 07/15/2013 0817   BILITOT 0.9 07/15/2013  [place] 07/15/2013 0817   GFRAA 99 07/15/2013 0817    Assessment/plan  1. Need for Tdap vaccination - Tdap vaccine greater than or equal to 7yo IM  2. HYPERTENSION Stable. cotninue vasotec 5 mg daily with baby aspirin  3. DM type 2 (diabetes mellitus, type 2) With a1c rising up, will change metformin to 500 mg daily. continue asa, statin and ACEI. Normal foot exam.  Check urine microalbumin - Microalbumin/Creatinine Ratio, Urine - Hemoglobin A1c; Future - CMP; Future - CBC with Differential; Future  4. HYPERLIPIDEMIA Reviewed lipid result. Continue lipitor 40 mg daily

## 2013-12-02 LAB — MICROALBUMIN / CREATININE URINE RATIO
Creatinine, Ur: 84.2 mg/dL (ref 22.0–328.0)
MICROALB/CREAT RATIO: <3.6 mg/g creat (ref 0.0–30.0)
Microalbumin, Urine: <3 ug/mL (ref 0.0–17.0)

## 2013-12-04 ENCOUNTER — Encounter: Payer: Self-pay | Admitting: *Deleted

## 2013-12-07 ENCOUNTER — Encounter: Payer: Self-pay | Admitting: Gastroenterology

## 2014-01-01 ENCOUNTER — Other Ambulatory Visit: Payer: Self-pay | Admitting: Internal Medicine

## 2014-01-28 ENCOUNTER — Ambulatory Visit (AMBULATORY_SURGERY_CENTER): Payer: Self-pay | Admitting: *Deleted

## 2014-01-28 VITALS — Ht 72.0 in | Wt 255.8 lb

## 2014-01-28 DIAGNOSIS — Z1211 Encounter for screening for malignant neoplasm of colon: Secondary | ICD-10-CM

## 2014-01-28 MED ORDER — NA SULFATE-K SULFATE-MG SULF 17.5-3.13-1.6 GM/177ML PO SOLN: ORAL | Status: DC

## 2014-01-28 NOTE — Progress Notes (Signed)
No egg or soy allergy  Pt denies taking diet medications  No anesthesia problems and denies being told he is difficult to intubate  Registered in [place]

## 2014-01-29 ENCOUNTER — Encounter: Payer: Self-pay | Admitting: Gastroenterology

## 2014-02-09 ENCOUNTER — Encounter: Payer: Self-pay | Admitting: Gastroenterology

## 2014-02-09 ENCOUNTER — Ambulatory Visit (AMBULATORY_SURGERY_CENTER): Payer: BC Managed Care – PPO | Admitting: Gastroenterology

## 2014-02-09 VITALS — BP 107/75 | HR 61 | Temp 96.3°F | Resp 18 | Ht 72.0 in | Wt 255.0 lb

## 2014-02-09 DIAGNOSIS — D126 Benign neoplasm of colon, unspecified: Secondary | ICD-10-CM

## 2014-02-09 DIAGNOSIS — Z1211 Encounter for screening for malignant neoplasm of colon: Secondary | ICD-10-CM

## 2014-02-09 MED ORDER — SODIUM CHLORIDE 0.9 % IV SOLN: 500.0000 mL | INTRAVENOUS | Status: DC

## 2014-02-09 NOTE — Op Note (Signed)
[place]  [address] [place], [address]   COLONOSCOPY PROCEDURE REPORT  PATIENT: Raymond [name], Raymond [name]  MR#: [id number] BIRTHDATE: [birth year] , 38  yrs. old GENDER: Male ENDOSCOPIST: Inda Castle, MD REFERRED BY: PROCEDURE DATE:  02/09/2014 PROCEDURE:   Colonoscopy with cold biopsy polypectomy First Screening Colonoscopy - Avg.  risk and is 50 yrs.  old or older - No.  Prior Negative Screening - Now for repeat screening. 10 or more years since last screening  History of Adenoma - Now for follow-up colonoscopy & has been > or = to 3 yrs.  N/A  Polyps Removed Today? Yes. ASA CLASS:   Class II INDICATIONS:Average risk patient for colon cancer. MEDICATIONS: MAC sedation, administered by CRNA and Propofol (Diprivan) 270 mg IV  DESCRIPTION OF PROCEDURE:   After the risks benefits and alternatives of the procedure were thoroughly explained, informed consent was obtained.  A digital rectal exam revealed no abnormalities of the rectum.   The LB CF-H180AL Loaner [id number] endoscope was introduced through the anus and advanced to the cecum, which was identified by both the appendix and ileocecal valve. No adverse events experienced.   The quality of the prep was excellent using Suprep  The instrument was then slowly withdrawn as the colon was fully examined.      COLON FINDINGS: A sessile polyp measuring 2 mm in size was found at the cecum.  A polypectomy was performed with cold forceps.   The colon was otherwise normal.  There was no diverticulosis, inflammation, polyps or cancers unless previously stated. Retroflexed views revealed no abnormalities. The time to cecum=3 minutes 48 seconds.  Withdrawal time=9 minutes 55 seconds.  The scope was withdrawn and the procedure completed. COMPLICATIONS: There were no complications.  ENDOSCOPIC IMPRESSION: 1.   Sessile polyp measuring 2 mm in size was found at the cecum; polypectomy was performed with cold forceps 2.    The colon was otherwise normal  RECOMMENDATIONS: If the polyp(s) removed today are proven to be adenomatous (pre-cancerous) polyps, you will need a repeat colonoscopy in 5 years.  Otherwise you should continue to follow colorectal cancer screening guidelines for "routine risk" patients with colonoscopy in 10 years.  You will receive a letter within 1-2 weeks with the results of your biopsy as well as final recommendations.  Please call my office if you have not received a letter after 3 weeks.   eSigned:  Inda Castle, MD 02/09/2014 9:27 AM   cc: Almyra Deforest, MD   PATIENT NAME:  Raymond [name], Raymond [name] MR#: [id number]

## 2014-02-09 NOTE — Progress Notes (Signed)
Called to room to assist during endoscopic procedure.  Patient ID and intended procedure confirmed with present staff. Received instructions for my participation in the procedure from the performing physician.  

## 2014-02-09 NOTE — Patient Instructions (Signed)
YOU HAD AN ENDOSCOPIC PROCEDURE TODAY AT THE [facility]: Refer to the procedure report that was given to you for any specific questions about what was found during the examination.  If the procedure report does not answer your questions, please call your gastroenterologist to clarify.  If you requested that your care partner not be given the details of your procedure findings, then the procedure report has been included in a sealed envelope for you to review at your convenience later.  YOU SHOULD EXPECT: Some feelings of bloating in the abdomen. Passage of more gas than usual.  Walking can help get rid of the air that was put into your GI tract during the procedure and reduce the bloating. If you had a lower endoscopy (such as a colonoscopy or flexible sigmoidoscopy) you may notice spotting of blood in your stool or on the toilet paper. If you underwent a bowel prep for your procedure, then you may not have a normal bowel movement for a few days.  DIET: Your first meal following the procedure should be a light meal and then it is ok to progress to your normal diet.  A half-sandwich or bowl of soup is an example of a good first meal.  Heavy or fried foods are harder to digest and may make you feel nauseous or bloated.  Likewise meals heavy in dairy and vegetables can cause extra gas to form and this can also increase the bloating.  Drink plenty of fluids but you should avoid alcoholic beverages for 24 hours.  ACTIVITY: Your care partner should take you home directly after the procedure.  You should plan to take it easy, moving slowly for the rest of the day.  You can resume normal activity the day after the procedure however you should NOT DRIVE or use heavy machinery for 24 hours (because of the sedation medicines used during the test).    SYMPTOMS TO REPORT IMMEDIATELY: A gastroenterologist can be reached at any hour.  During normal business hours, 8:30 AM to 5:00 PM Monday through Friday,  call [phone].  After hours and on weekends, please call the GI answering service at [phone] who will take a message and have the physician on call contact you.   Following lower endoscopy (colonoscopy or flexible sigmoidoscopy):  Excessive amounts of blood in the stool  Significant tenderness or worsening of abdominal pains  Swelling of the abdomen that is new, acute  Fever of 100F or higher  FOLLOW UP: If any biopsies were taken you will be contacted by phone or by letter within the next 1-3 weeks.  Call your gastroenterologist if you have not heard about the biopsies in 3 weeks.  Our staff will call the home number listed on your records the next business day following your procedure to check on you and address any questions or concerns that you may have at that time regarding the information given to you following your procedure. This is a courtesy call and so if there is no answer at the home number and we have not heard from you through the emergency physician on call, we will assume that you have returned to your regular daily activities without incident.  SIGNATURES/CONFIDENTIALITY: You and/or your care partner have signed paperwork which will be entered into your electronic medical record.  These signatures attest to the fact that that the information above on your After Visit Summary has been reviewed and is understood.  Full responsibility of the confidentiality of this   discharge information lies with you and/or your care-partner.    Resume medications. Information given on polyps with discharge instructions. 

## 2014-02-09 NOTE — Progress Notes (Signed)
Report to PACU, RN, vss, BBS= Clear.  

## 2014-02-10 ENCOUNTER — Telehealth: Payer: Self-pay | Admitting: *Deleted

## 2014-02-10 NOTE — Telephone Encounter (Signed)
  Follow up Call-  Call back number 02/09/2014  Post procedure Call Back phone  # [phone]  Permission to leave phone message Yes     Patient questions:  Do you have a fever, pain , or abdominal swelling? No. Pain Score  0 *  Have you tolerated food without any problems? Yes.    Have you been able to return to your normal activities? Yes.    Do you have any questions about your discharge instructions: Diet   No. Medications  No. Follow up visit  No.  Do you have questions or concerns about your Care? No.  Actions: * If pain score is 4 or above: No action needed, pain <4.

## 2014-02-16 ENCOUNTER — Encounter: Payer: Self-pay | Admitting: Gastroenterology

## 2014-02-26 ENCOUNTER — Other Ambulatory Visit: Payer: BC Managed Care – PPO

## 2014-02-26 DIAGNOSIS — E119 Type 2 diabetes mellitus without complications: Secondary | ICD-10-CM

## 2014-02-27 LAB — CBC WITH DIFFERENTIAL/PLATELET
BASOS ABS: 0 10*3/uL (ref 0.0–0.2)
Basos: 1 %
Eos: 5 %
Eosinophils Absolute: 0.2 10*3/uL (ref 0.0–0.4)
HEMATOCRIT: 41.4 % (ref 37.5–51.0)
Hemoglobin: 14.3 g/dL (ref 12.6–17.7)
IMMATURE GRANULOCYTES: 0 %
Immature Grans (Abs): 0 10*3/uL (ref 0.0–0.1)
Lymphocytes Absolute: 1.5 10*3/uL (ref 0.7–3.1)
Lymphs: 38 %
MCH: 27.8 pg (ref 26.6–33.0)
MCHC: 34.5 g/dL (ref 31.5–35.7)
MCV: 81 fL (ref 79–97)
MONOCYTES: 13 %
MONOS ABS: 0.5 10*3/uL (ref 0.1–0.9)
Neutrophils Absolute: 1.7 10*3/uL (ref 1.4–7.0)
Neutrophils Relative %: 43 %
RBC: 5.14 x10E6/uL (ref 4.14–5.80)
RDW: 14.9 % (ref 12.3–15.4)
WBC: 3.8 10*3/uL (ref 3.4–10.8)

## 2014-02-27 LAB — COMPREHENSIVE METABOLIC PANEL
ALT: 27 IU/L (ref 0–44)
AST: 22 IU/L (ref 0–40)
Albumin/Globulin Ratio: 1.9 (ref 1.1–2.5)
Albumin: 4.3 g/dL (ref 3.6–4.8)
Alkaline Phosphatase: 64 IU/L (ref 39–117)
BUN/Creatinine Ratio: 14 (ref 10–22)
BUN: 15 mg/dL (ref 8–27)
CO2: 19 mmol/L (ref 18–29)
Calcium: 9.3 mg/dL (ref 8.6–10.2)
Chloride: 101 mmol/L (ref 97–108)
Creatinine, Ser: 1.06 mg/dL (ref 0.76–1.27)
GFR calc Af Amer: 87 mL/min/{1.73_m2} (ref >59)
GFR, EST NON AFRICAN AMERICAN: 75 mL/min/{1.73_m2} (ref >59)
GLUCOSE: 106 mg/dL — AB (ref 65–99)
Globulin, Total: 2.3 g/dL (ref 1.5–4.5)
Potassium: 4.6 mmol/L (ref 3.5–5.2)
Sodium: 137 mmol/L (ref 134–144)
TOTAL PROTEIN: 6.6 g/dL (ref 6.0–8.5)
Total Bilirubin: 0.6 mg/dL (ref 0.0–1.2)

## 2014-02-27 LAB — HEMOGLOBIN A1C
Est. average glucose Bld gHb Est-mCnc: 140 mg/dL
Hgb A1c MFr Bld: 6.5 % — ABNORMAL HIGH (ref 4.8–5.6)

## 2014-03-02 ENCOUNTER — Ambulatory Visit (INDEPENDENT_AMBULATORY_CARE_PROVIDER_SITE_OTHER): Payer: BC Managed Care – PPO | Admitting: Internal Medicine

## 2014-03-02 ENCOUNTER — Encounter: Payer: Self-pay | Admitting: Internal Medicine

## 2014-03-02 VITALS — BP 122/78 | HR 67 | Temp 97.9°F | Wt 256.6 lb

## 2014-03-02 DIAGNOSIS — I10 Essential (primary) hypertension: Secondary | ICD-10-CM

## 2014-03-02 DIAGNOSIS — E119 Type 2 diabetes mellitus without complications: Secondary | ICD-10-CM

## 2014-03-02 DIAGNOSIS — E785 Hyperlipidemia, unspecified: Secondary | ICD-10-CM

## 2014-03-02 NOTE — Progress Notes (Signed)
Patient ID: Raymond [name], male   DOB: [birth year], 63 y.o.   MRN: [id number]    Chief Complaint  Patient presents with  . Follow-up    3 month f/u & discuss lab results (printed)   No Known Allergies  HPI 63 y/o male pt seen today for routine visit. cbg 121 one am otherwise 106-114. No hypoglycemic episodes He denies any complaints.  Not exercising recently Compliant with his meds  Review of Systems   Constitutional: Negative for fever, chills, malaise/fatigue and diaphoresis.   HENT: Negative for congestion.    Eyes: Negative for blurred vision and double vision.   Respiratory: Negative for cough and shortness of breath.    Cardiovascular: Negative for chest pain, palpitations and leg swelling.   Gastrointestinal: Negative for heartburn, nausea, vomiting, abdominal pain, diarrhea and constipation.   Genitourinary: Negative for dysuria and flank pain.   Musculoskeletal: Negative for myalgias, joint pain and falls.   Skin: Negative for itching and rash.   Neurological: Negative for seizures and headaches.   Psychiatric/Behavioral: Negative for depression and memory loss. The patient is not nervous/anxious.   Past Medical History  Diagnosis Date  . Problems with sexual function   . Unspecified vitamin D deficiency   . Edema   . Obesity, unspecified   . GERD (gastroesophageal reflux disease)   . Diabetes mellitus without complication   . Hyperlipidemia   . Hypertension   . Unspecified sleep apnea   . Sleep apnea     released per MD, no CPAP   Current Outpatient Prescriptions on File Prior to Visit  Medication Sig Dispense Refill  . aspirin 81 MG tablet Take 81 mg by mouth daily.        Marland Kitchen atorvastatin (LIPITOR) 40 MG tablet TAKE 1 TABLET BY MOUTH DAILY AT BEDTIME  30 tablet  6  . cholecalciferol (VITAMIN D) 1000 UNITS tablet Take 400 Units by mouth daily. Take 1 tablet daily      . enalapril (VASOTEC) 5 MG tablet TAKE 1 TABLET EVERY DAY FOR KIDNEYS  30 tablet  6  .  glucose blood test strip 1 each. Check blood sugar 2-3 x weekly      . metFORMIN (GLUCOPHAGE) 500 MG tablet Take one tablet by mouth every day to control blood sugar  30 tablet  5   No current facility-administered medications on file prior to visit.   Physical exam BP 122/78  Pulse 67  Temp(Src) 97.9 F (36.6 C) (Oral)  Wt 256 lb 9.6 oz (116.393 kg)  SpO2 96%  gen- adult, overweight male in NAD HEENT- no pallor, no icterus, no LAD, MMM cvs- ns 1,s2, rrr respi- CTAB, no wheeze or rhonchi or crackles abdo- bs+, soft, non tender Ext- no edema, no sores/ ulcers, able to move all 4 Neuro- no focal deficit, normal foot exam Psych- mood appropriate, alert and oriented   Lab Results  Component Value Date   HGBA1C 6.5* 02/26/2014    Wt Readings from Last 3 Encounters:  03/02/14 256 lb 9.6 oz (116.393 kg)  02/09/14 255 lb (115.667 kg)  01/28/14 255 lb 12.8 oz (116.03 kg)   Lipid Panel     Component Value Date/Time   TRIG 151* 11/26/2013 0816   HDL 27* 11/26/2013 0816   CHOLHDL 4.6 11/26/2013 0816   LDLCALC 66 11/26/2013 0816   CBC Latest Ref Rng 02/26/2014  WBC 3.4 - 10.8 x10E3/uL 3.8  Hemoglobin 12.6 - 17.7 g/dL 14.3  Hematocrit 37.5 - 51.0 % 41.4  Assessment/plan  1. HYPERTENSION Stable. cotninue vasotec 5 mg daily with baby aspirin - CMP; Future  2. Type 2 diabetes mellitus without complication Continue metformin  500 mg daily. continue asa, statin and ACEI. Normal foot exam. Normal urine microalbumin. a1c improved  - Hemoglobin A1c; Future - CMP; Future  3. HYPERLIPIDEMIA Continue lipitor 40 mg daily - Lipid Panel; Future

## 2014-04-17 ENCOUNTER — Other Ambulatory Visit: Payer: Self-pay | Admitting: Internal Medicine

## 2014-06-13 ENCOUNTER — Other Ambulatory Visit: Payer: Self-pay | Admitting: Internal Medicine

## 2014-07-05 ENCOUNTER — Other Ambulatory Visit: Payer: BC Managed Care – PPO

## 2014-07-05 ENCOUNTER — Other Ambulatory Visit: Payer: Self-pay | Admitting: Internal Medicine

## 2014-07-05 DIAGNOSIS — I10 Essential (primary) hypertension: Secondary | ICD-10-CM

## 2014-07-05 DIAGNOSIS — E119 Type 2 diabetes mellitus without complications: Secondary | ICD-10-CM

## 2014-07-05 DIAGNOSIS — E785 Hyperlipidemia, unspecified: Secondary | ICD-10-CM

## 2014-07-06 LAB — COMPREHENSIVE METABOLIC PANEL
ALBUMIN: 4.2 g/dL (ref 3.6–4.8)
ALT: 30 IU/L (ref 0–44)
AST: 26 IU/L (ref 0–40)
Albumin/Globulin Ratio: 1.9 (ref 1.1–2.5)
Alkaline Phosphatase: 61 IU/L (ref 39–117)
BUN/Creatinine Ratio: 14 (ref 10–22)
BUN: 14 mg/dL (ref 8–27)
CO2: 21 mmol/L (ref 18–29)
CREATININE: 0.98 mg/dL (ref 0.76–1.27)
Calcium: 9.1 mg/dL (ref 8.6–10.2)
Chloride: 100 mmol/L (ref 97–108)
GFR calc Af Amer: 95 mL/min/{1.73_m2} (ref >59)
GFR calc non Af Amer: 82 mL/min/{1.73_m2} (ref >59)
GLOBULIN, TOTAL: 2.2 g/dL (ref 1.5–4.5)
GLUCOSE: 106 mg/dL — AB (ref 65–99)
Potassium: 4.5 mmol/L (ref 3.5–5.2)
Sodium: 137 mmol/L (ref 134–144)
Total Bilirubin: 1.1 mg/dL (ref 0.0–1.2)
Total Protein: 6.4 g/dL (ref 6.0–8.5)

## 2014-07-06 LAB — LIPID PANEL
CHOL/HDL RATIO: 4.5 ratio (ref 0.0–5.0)
CHOLESTEROL TOTAL: 125 mg/dL (ref 100–199)
HDL: 28 mg/dL — AB (ref >39)
LDL CALC: 64 mg/dL (ref 0–99)
TRIGLYCERIDES: 164 mg/dL — AB (ref 0–149)
VLDL CHOLESTEROL CAL: 33 mg/dL (ref 5–40)

## 2014-07-06 LAB — HEMOGLOBIN A1C
Est. average glucose Bld gHb Est-mCnc: 146 mg/dL
Hgb A1c MFr Bld: 6.7 % — ABNORMAL HIGH (ref 4.8–5.6)

## 2014-07-07 ENCOUNTER — Ambulatory Visit: Payer: BC Managed Care – PPO | Admitting: Internal Medicine

## 2014-07-12 ENCOUNTER — Encounter: Payer: Self-pay | Admitting: Nurse Practitioner

## 2014-07-12 ENCOUNTER — Ambulatory Visit (INDEPENDENT_AMBULATORY_CARE_PROVIDER_SITE_OTHER): Payer: BC Managed Care – PPO | Admitting: *Deleted

## 2014-07-12 ENCOUNTER — Ambulatory Visit (INDEPENDENT_AMBULATORY_CARE_PROVIDER_SITE_OTHER): Payer: BC Managed Care – PPO | Admitting: Nurse Practitioner

## 2014-07-12 VITALS — BP 120/80 | HR 78 | Temp 98.1°F | Resp 18 | Ht 72.0 in | Wt 258.0 lb

## 2014-07-12 DIAGNOSIS — Z23 Encounter for immunization: Secondary | ICD-10-CM

## 2014-07-12 DIAGNOSIS — I10 Essential (primary) hypertension: Secondary | ICD-10-CM

## 2014-07-12 DIAGNOSIS — E785 Hyperlipidemia, unspecified: Secondary | ICD-10-CM

## 2014-07-12 DIAGNOSIS — E119 Type 2 diabetes mellitus without complications: Secondary | ICD-10-CM

## 2014-07-12 NOTE — Progress Notes (Signed)
Patient ID: Raymond [name], male   DOB: March 30, 1951, 63 y.o.   MRN: [id number]    PCP: Blanchie Serve, MD  No Known Allergies  Chief Complaint  Patient presents with  . Medical Management of Chronic Issues     HPI: Patient is a 63 y.o. male seen in the office today for routine follow up on DM, HTN, Obesity, hyperlipidemia. Pt reports he doing well except he has gained weight. Reports his wife makes lots of sweets.  Has not exercised as he should.  Review of Systems:  Review of Systems  Constitutional: Negative for activity change, appetite change, fatigue and unexpected weight change.  HENT: Negative for congestion and hearing loss.   Eyes: Negative.        Unsure when his last eye exam was  Respiratory: Negative for cough and shortness of breath.   Cardiovascular: Negative for chest pain, palpitations and leg swelling.  Gastrointestinal: Negative for abdominal pain, diarrhea and constipation.  Genitourinary: Negative for dysuria and difficulty urinating.  Musculoskeletal: Negative for myalgias and arthralgias.  Skin: Negative for color change and wound.  Neurological: Negative for dizziness and weakness.  Psychiatric/Behavioral: Negative for behavioral problems, confusion and agitation.    Past Medical History  Diagnosis Date  . Problems with sexual function   . Unspecified vitamin D deficiency   . Edema   . Obesity, unspecified   . GERD (gastroesophageal reflux disease)   . Diabetes mellitus without complication   . Hyperlipidemia   . Hypertension   . Unspecified sleep apnea   . Sleep apnea     released per MD, no CPAP   Past Surgical History  Procedure Laterality Date  . Cataract extraction  1964    Dr.Freed   . Hernia repair  1987    Right Side, Dr.Kaufman   . Colonoscopy     Social History:   reports that he has never smoked. He has never used smokeless tobacco. He reports that he does not drink alcohol or use illicit drugs.  Family History  Problem Relation  Age of Onset  . Colon cancer Mother   . Esophageal cancer Neg Hx   . Stomach cancer Neg Hx   . Rectal cancer Neg Hx     Medications: Patient's Medications  New Prescriptions   No medications on file  Previous Medications   ASPIRIN 81 MG TABLET    Take 81 mg by mouth daily.     ATORVASTATIN (LIPITOR) 40 MG TABLET    TAKE 1 TABLET BY MOUTH EVERY DAY AT BEDTIME   CHOLECALCIFEROL (VITAMIN D) 1000 UNITS TABLET    Take 400 Units by mouth daily. Take 1 tablet daily   DEXTROMETHORPHAN-GUAIFENESIN (MUCINEX DM) 30-600 MG PER 12 HR TABLET    Take 1 tablet by mouth 2 (two) times daily.   ENALAPRIL (VASOTEC) 5 MG TABLET    TAKE 1 TABLET EVERY DAY FOR KIDNEYS   ENALAPRIL (VASOTEC) 5 MG TABLET    TAKE 1 TABLET EVERY DAY FOR KIDNEYS   FLUTICASONE (FLONASE) 50 MCG/ACT NASAL SPRAY    Place 2 sprays into both nostrils daily.   GLUCOSE BLOOD TEST STRIP    1 each. Check blood sugar 2-3 x weekly   METFORMIN (GLUCOPHAGE) 500 MG TABLET    TAKE ONE TABLET BY MOUTH EVERY DAY TO CONTROL BLOOD SUGAR  Modified Medications   No medications on file  Discontinued Medications   No medications on file     Physical Exam:  Filed Vitals:  07/12/14 1313  BP: 120/80  Pulse: 78  Temp: 98.1 F (36.7 C)  TempSrc: Oral  Resp: 18  Height: 6' (1.829 m)  Weight: 258 lb (117.028 kg)  SpO2: 98%    Physical Exam  Constitutional: He is oriented to person, place, and time. He appears well-developed and well-nourished. No distress.  HENT:  Head: Normocephalic and atraumatic.  Mouth/Throat: Oropharynx is clear and moist. No oropharyngeal exudate.  Eyes: Conjunctivae and EOM are normal. Pupils are equal, round, and reactive to light.  Neck: Normal range of motion. Neck supple.  Cardiovascular: Normal rate, regular rhythm and normal heart sounds.   Pulmonary/Chest: Effort normal and breath sounds normal.  Abdominal: Soft. Bowel sounds are normal.  Musculoskeletal: Normal range of motion. He exhibits no edema or  tenderness.  Neurological: He is alert and oriented to person, place, and time.  Skin: Skin is warm and dry. He is not diaphoretic.  Psychiatric: He has a normal mood and affect.    Labs reviewed: Basic Metabolic Panel:  Recent Labs  07/15/13 0817 02/26/14 0808 07/05/14 0810  NA 141 137 137  K 4.5 4.6 4.5  CL 104 101 100  CO2 17* 19 21  GLUCOSE 103* 106* 106*  BUN 18 15 14   CREATININE 0.95 1.06 0.98  CALCIUM 9.2 9.3 9.1   Liver Function Tests:  Recent Labs  07/15/13 0817 02/26/14 0808 07/05/14 0810  AST 22 22 26   ALT 29 27 30   ALKPHOS 62 64 61  BILITOT 0.9 0.6 1.1  PROT 6.7 6.6 6.4   No results for input(s): LIPASE, AMYLASE in the last 8760 hours. No results for input(s): AMMONIA in the last 8760 hours. CBC:  Recent Labs  02/26/14 0808  WBC 3.8  NEUTROABS 1.7  HGB 14.3  HCT 41.4  MCV 81   Lipid Panel:  Recent Labs  07/15/13 0817 11/26/13 0816 07/05/14 0810  HDL 27* 27* 28*  LDLCALC 58 66 64  TRIG 100 151* 164*  CHOLHDL 3.9 4.6 4.5   TSH: No results for input(s): TSH in the last 8760 hours. A1C: Lab Results  Component Value Date   HGBA1C 6.7* 07/05/2014     Assessment/Plan  1. Essential hypertension -encouraged lifestyle  Modifications, conts on vasotec (mostly for renal protection) and ASA - CBC With differential/Platelet; Future  2. Type 2 diabetes mellitus without complication -Z6X still in appropriate range but worse than previous, pt also with weight gain. Discussed diet and exercise.  Will cont current medications - Hemoglobin A1c; Future - to get eye exam if he has not done so this year.  3. Hyperlipidemia -LDL at goal. Discussed labs -trig worse, discussed lifestyle modifications that would help - Lipid panel; Future - Comprehensive metabolic panel; Future  4. Obesity Discussed weight gain and need for lifestyle modifications, need to exercise and cut back on sweets.  Follow up in 6 months for EV with Bubba Camp with labs  before visit

## 2014-07-12 NOTE — Patient Instructions (Addendum)
Will get flu shot today  Work on diet and exercise 30 mins physical activity 5 days a week Doing well but to cut back on desserts and smaller portions of starches   Follow up with 6 months with blood work prior to visit for a EV with [employer]

## 2014-08-20 ENCOUNTER — Telehealth: Payer: Self-pay | Admitting: *Deleted

## 2014-08-20 NOTE — Telephone Encounter (Signed)
Patient called wanting to know the status of the FMLA Paperwork. Patient stated that he received a letter from [place] him because they didn't either receive that paperwork or it wasn't filled out completely. Please Advise.

## 2014-08-20 NOTE — Telephone Encounter (Signed)
i needed some specifics from the patient about the dates. I had asked Caren Griffins to follow on it.

## 2014-08-24 NOTE — Telephone Encounter (Signed)
I called patient to get specifics per Dr.Pandey's request Patient needs an Intermittent leave, dates should be 07/20/14-07/21/15 (1 year) for 5 days per week

## 2014-09-28 ENCOUNTER — Encounter: Payer: Self-pay | Admitting: Internal Medicine

## 2014-10-18 ENCOUNTER — Telehealth: Payer: Self-pay | Admitting: Nurse Practitioner

## 2014-10-18 NOTE — Telephone Encounter (Signed)
Raymond [name] dropped off an FMLA form to be filled out. The form was put in the rx tray.

## 2014-10-20 NOTE — Telephone Encounter (Signed)
Paperwork placed for Dr. Bubba Camp to review.

## 2014-10-30 ENCOUNTER — Other Ambulatory Visit: Payer: Self-pay | Admitting: Internal Medicine

## 2014-11-28 ENCOUNTER — Other Ambulatory Visit: Payer: Self-pay | Admitting: Internal Medicine

## 2015-01-14 ENCOUNTER — Other Ambulatory Visit: Payer: BLUE CROSS/BLUE SHIELD

## 2015-01-14 DIAGNOSIS — E785 Hyperlipidemia, unspecified: Secondary | ICD-10-CM

## 2015-01-14 DIAGNOSIS — E119 Type 2 diabetes mellitus without complications: Secondary | ICD-10-CM

## 2015-01-14 DIAGNOSIS — I10 Essential (primary) hypertension: Secondary | ICD-10-CM

## 2015-01-15 LAB — CBC WITH DIFFERENTIAL
BASOS: 1 %
Basophils Absolute: 0 10*3/uL (ref 0.0–0.2)
EOS (ABSOLUTE): 0.2 10*3/uL (ref 0.0–0.4)
Eos: 4 %
HEMATOCRIT: 39.9 % (ref 37.5–51.0)
HEMOGLOBIN: 13.5 g/dL (ref 12.6–17.7)
Immature Grans (Abs): 0 10*3/uL (ref 0.0–0.1)
Immature Granulocytes: 0 %
Lymphocytes Absolute: 1.4 10*3/uL (ref 0.7–3.1)
Lymphs: 36 %
MCH: 27.1 pg (ref 26.6–33.0)
MCHC: 33.8 g/dL (ref 31.5–35.7)
MCV: 80 fL (ref 79–97)
Monocytes Absolute: 0.5 10*3/uL (ref 0.1–0.9)
Monocytes: 12 %
NEUTROS ABS: 1.8 10*3/uL (ref 1.4–7.0)
Neutrophils: 47 %
RBC: 4.98 x10E6/uL (ref 4.14–5.80)
RDW: 15 % (ref 12.3–15.4)
WBC: 3.9 10*3/uL (ref 3.4–10.8)

## 2015-01-15 LAB — LIPID PANEL
CHOL/HDL RATIO: 4.4 ratio (ref 0.0–5.0)
Cholesterol, Total: 119 mg/dL (ref 100–199)
HDL: 27 mg/dL — ABNORMAL LOW (ref >39)
LDL CALC: 64 mg/dL (ref 0–99)
Triglycerides: 138 mg/dL (ref 0–149)
VLDL Cholesterol Cal: 28 mg/dL (ref 5–40)

## 2015-01-15 LAB — COMPREHENSIVE METABOLIC PANEL
A/G RATIO: 1.8 (ref 1.1–2.5)
ALT: 37 IU/L (ref 0–44)
AST: 25 IU/L (ref 0–40)
Albumin: 4.3 g/dL (ref 3.6–4.8)
Alkaline Phosphatase: 57 IU/L (ref 39–117)
BUN/Creatinine Ratio: 19 (ref 10–22)
BUN: 19 mg/dL (ref 8–27)
Bilirubin Total: 0.6 mg/dL (ref 0.0–1.2)
CALCIUM: 9.1 mg/dL (ref 8.6–10.2)
CHLORIDE: 101 mmol/L (ref 97–108)
CO2: 21 mmol/L (ref 18–29)
Creatinine, Ser: 1 mg/dL (ref 0.76–1.27)
GFR calc Af Amer: 92 mL/min/{1.73_m2} (ref >59)
GFR calc non Af Amer: 80 mL/min/{1.73_m2} (ref >59)
GLOBULIN, TOTAL: 2.4 g/dL (ref 1.5–4.5)
GLUCOSE: 108 mg/dL — AB (ref 65–99)
Potassium: 4.5 mmol/L (ref 3.5–5.2)
Sodium: 137 mmol/L (ref 134–144)
Total Protein: 6.7 g/dL (ref 6.0–8.5)

## 2015-01-15 LAB — HEMOGLOBIN A1C
ESTIMATED AVERAGE GLUCOSE: 151 mg/dL
HEMOGLOBIN A1C: 6.9 % — AB (ref 4.8–5.6)

## 2015-01-18 ENCOUNTER — Encounter: Payer: Self-pay | Admitting: Internal Medicine

## 2015-01-20 ENCOUNTER — Encounter: Payer: Self-pay | Admitting: Nurse Practitioner

## 2015-01-20 ENCOUNTER — Ambulatory Visit (INDEPENDENT_AMBULATORY_CARE_PROVIDER_SITE_OTHER): Payer: BLUE CROSS/BLUE SHIELD | Admitting: Nurse Practitioner

## 2015-01-20 VITALS — BP 120/78 | HR 85 | Temp 97.8°F | Resp 20 | Ht 72.0 in | Wt 262.4 lb

## 2015-01-20 DIAGNOSIS — E119 Type 2 diabetes mellitus without complications: Secondary | ICD-10-CM

## 2015-01-20 DIAGNOSIS — Z0000 Encounter for general adult medical examination without abnormal findings: Secondary | ICD-10-CM | POA: Diagnosis not present

## 2015-01-20 MED ORDER — METFORMIN HCL 500 MG PO TABS: 500.0000 mg | ORAL_TABLET | Freq: Two times a day (BID) | ORAL | Status: DC

## 2015-01-20 NOTE — Progress Notes (Signed)
Patient ID: Raymond [name], male   DOB: Jun 05, 1951, 64 y.o.   MRN: [id number]    PCP: [name], NP  No Known Allergies  Chief Complaint  Patient presents with  . Annual Exam     HPI: Patient is a 64 y.o. male seen in the office today for annual exam.   Screenings: Colon Cancer- colonoscopy every 5 years, last in 2015 Prostate Cancer- PSA done here, unknown date  Vaccines Up to date on: influenza, pneumococcal, and  Tdap  Smoking status: nonsmoker, no history Alcohol use: none  Dentist: has not gone in a while Ophthalmologist: yearly, last appt was 05/2014  Exercise regimen: not currently  Diet: does not do any dietary modification    Advanced Directive information Does patient have an advance directive?: No, Would patient like information on creating an advanced directive?: Yes - Educational materials given Review of Systems:  Review of Systems  Constitutional: Negative for activity change, appetite change, fatigue and unexpected weight change.  HENT: Negative for congestion and hearing loss.   Eyes: Negative.   Respiratory: Negative for cough and shortness of breath.   Cardiovascular: Negative for chest pain, palpitations and leg swelling.  Gastrointestinal: Negative for abdominal pain, diarrhea and constipation.  Genitourinary: Negative for dysuria and difficulty urinating.  Musculoskeletal: Negative for myalgias and arthralgias.  Skin: Negative for color change and wound.  Neurological: Negative for dizziness and weakness.  Psychiatric/Behavioral: Negative for behavioral problems, confusion and agitation.    Past Medical History  Diagnosis Date  . Problems with sexual function   . Unspecified vitamin D deficiency   . Edema   . Obesity, unspecified   . GERD (gastroesophageal reflux disease)   . Diabetes mellitus without complication   . Hyperlipidemia   . Hypertension   . Unspecified sleep apnea   . Sleep apnea     released per MD, no CPAP    Past Surgical History  Procedure Laterality Date  . Cataract extraction  1964    Dr.Freed   . Hernia repair  1987    Right Side, Dr.Kaufman   . Colonoscopy     Social History:   reports that he has never smoked. He has never used smokeless tobacco. He reports that he does not drink alcohol or use illicit drugs.  Family History  Problem Relation Age of Onset  . Colon cancer Mother   . Esophageal cancer Neg Hx   . Stomach cancer Neg Hx   . Rectal cancer Neg Hx     Medications: Patient's Medications  New Prescriptions   No medications on file  Previous Medications   ASPIRIN 81 MG TABLET    Take 81 mg by mouth daily.     ATORVASTATIN (LIPITOR) 40 MG TABLET    TAKE 1 TABLET BY MOUTH EVERY DAY AT BEDTIME   CHOLECALCIFEROL (VITAMIN D) 1000 UNITS TABLET    Take 400 Units by mouth daily. Take 1 tablet daily   DEXTROMETHORPHAN-GUAIFENESIN (MUCINEX DM) 30-600 MG PER 12 HR TABLET    Take 1 tablet by mouth 2 (two) times daily.   ENALAPRIL (VASOTEC) 5 MG TABLET    TAKE 1 TABLET EVERY DAY FOR KIDNEYS   FLUTICASONE (FLONASE) 50 MCG/ACT NASAL SPRAY    Place 2 sprays into both nostrils daily.   GLUCOSE BLOOD TEST STRIP    1 each. Check blood sugar 2-3 x weekly   METFORMIN (GLUCOPHAGE) 500 MG TABLET    TAKE 1 TABLET BY MOUTH EVERY DAY TO CONTROL BLOOD  SUGAR  Modified Medications   No medications on file  Discontinued Medications   No medications on file     Physical Exam:  Filed Vitals:   01/20/15 0912  BP: 120/78  Pulse: 85  Temp: 97.8 F (36.6 C)  TempSrc: Oral  Resp: 20  Height: 6' (1.829 m)  Weight: 262 lb 6.4 oz (119.024 kg)  SpO2: 95%    Physical Exam  Constitutional: He is oriented to person, place, and time. He appears well-developed and well-nourished. No distress.  HENT:  Head: Normocephalic and atraumatic.  Right Ear: External ear normal.  Left Ear: External ear normal.  Nose: Nose normal.  Mouth/Throat: Oropharynx is clear and moist. No oropharyngeal exudate.   Eyes: Conjunctivae and EOM are normal. Right pupil is not round and not reactive (from childhood injury).  Neck: Normal range of motion. Neck supple. No thyromegaly present.  Cardiovascular: Normal rate, regular rhythm and normal heart sounds.   Pulmonary/Chest: Effort normal and breath sounds normal.  Abdominal: Soft. Bowel sounds are normal. He exhibits no distension. There is no tenderness. There is no rebound.  Musculoskeletal: Normal range of motion. He exhibits no edema or tenderness.  Lymphadenopathy:    He has no cervical adenopathy.  Neurological: He is alert and oriented to person, place, and time. He has normal reflexes.  Skin: Skin is warm and dry. He is not diaphoretic.  Psychiatric: He has a normal mood and affect.    Labs reviewed: Basic Metabolic Panel:  Recent Labs  02/26/14 0808 07/05/14 0810 01/14/15 0804  NA 137 137 137  K 4.6 4.5 4.5  CL 101 100 101  CO2 19 21 21   GLUCOSE 106* 106* 108*  BUN 15 14 19   CREATININE 1.06 0.98 1.00  CALCIUM 9.3 9.1 9.1   Liver Function Tests:  Recent Labs  02/26/14 0808 07/05/14 0810 01/14/15 0804  AST 22 26 25   ALT 27 30 37  ALKPHOS 64 61 57  BILITOT 0.6 1.1 0.6  PROT 6.6 6.4 6.7   No results for input(s): LIPASE, AMYLASE in the last 8760 hours. No results for input(s): AMMONIA in the last 8760 hours. CBC:  Recent Labs  02/26/14 0808 01/14/15 0804  WBC 3.8 3.9  NEUTROABS 1.7 1.8  HGB 14.3  --   HCT 41.4 39.9  MCV 81  --    Lipid Panel:  Recent Labs  07/05/14 0810 01/14/15 0804  CHOL 125 119  HDL 28* 27*  LDLCALC 64 64  TRIG 164* 138  CHOLHDL 4.5 4.4   TSH: No results for input(s): TSH in the last 8760 hours. A1C: Lab Results  Component Value Date   HGBA1C 6.9* 01/14/2015     Assessment/Plan 1. Preventative health care  The patient is doing well and no distinct problems were identified on exam. Pt does need to work on diet and exercise control  PREVENTIVE COUNSELING:  The patient was  counseled regarding the appropriate use of alcohol, regular self-examination of the breasts on a monthly basis, prevention of dental and periodontal disease, diet, regular sustained exercise for at least 30 minutes 5 times per week, testicular self-examination on a monthly basis,smoking cessation, and recommended schedule for GI hemoccult testing, colonoscopy, cholesterol, thyroid and diabetes screening. - PSA; Future  2. Type 2 diabetes mellitus without complication -dicussed lifestyle modifications -will increase metformin due to A1c trending up, pt has not made lifestyle modifications  - Hemoglobin A1c; Future - Basic metabolic panel; Future - metFORMIN (GLUCOPHAGE) 500 MG tablet; Take 1  tablet (500 mg total) by mouth 2 (two) times daily with a meal.  Dispense: 30 tablet; Refill: 5  Follow up in 4 months with lab work prior to visit   Jessica K. Harle Battiest  [facility] [phone] 8 am - 5 pm) [phone] (after hours)

## 2015-01-20 NOTE — Patient Instructions (Signed)
Follow up in 4 months with blood work prior to visit Get back on your exercise regimen    Preventive Care for Adults A healthy lifestyle and preventive care can promote health and wellness. Preventive health guidelines for men include the following key practices:  A routine yearly physical is a good way to check with your health care provider about your health and preventative screening. It is a chance to share any concerns and updates on your health and to receive a thorough exam.  Visit your dentist for a routine exam and preventative care every 6 months. Brush your teeth twice a day and floss once a day. Good oral hygiene prevents tooth decay and gum disease.  The frequency of eye exams is based on your age, health, family medical history, use of contact lenses, and other factors. Follow your health care provider's recommendations for frequency of eye exams.  Eat a healthy diet. Foods such as vegetables, fruits, whole grains, low-fat dairy products, and lean protein foods contain the nutrients you need without too many calories. Decrease your intake of foods high in solid fats, added sugars, and salt. Eat the right amount of calories for you.Get information about a proper diet from your health care provider, if necessary.  Regular physical exercise is one of the most important things you can do for your health. Most adults should get at least 150 minutes of moderate-intensity exercise (any activity that increases your heart rate and causes you to sweat) each week. In addition, most adults need muscle-strengthening exercises on 2 or more days a week.  Maintain a healthy weight. The body mass index (BMI) is a screening tool to identify possible weight problems. It provides an estimate of body fat based on height and weight. Your health care provider can find your BMI and can help you achieve or maintain a healthy weight.For adults 20 years and older:  A BMI below 18.5 is considered  underweight.  A BMI of 18.5 to 24.9 is normal.  A BMI of 25 to 29.9 is considered overweight.  A BMI of 30 and above is considered obese.  Maintain normal blood lipids and cholesterol levels by exercising and minimizing your intake of saturated fat. Eat a balanced diet with plenty of fruit and vegetables. Blood tests for lipids and cholesterol should begin at age 70 and be repeated every 5 years. If your lipid or cholesterol levels are high, you are over 50, or you are at high risk for heart disease, you may need your cholesterol levels checked more frequently.Ongoing high lipid and cholesterol levels should be treated with medicines if diet and exercise are not working.  If you smoke, find out from your health care provider how to quit. If you do not use tobacco, do not start.  Lung cancer screening is recommended for adults aged 57-80 years who are at high risk for developing lung cancer because of a history of smoking. A yearly low-dose CT scan of the lungs is recommended for people who have at least a 30-pack-year history of smoking and are a current smoker or have quit within the past 15 years. A pack year of smoking is smoking an average of 1 pack of cigarettes a day for 1 year (for example: 1 pack a day for 30 years or 2 packs a day for 15 years). Yearly screening should continue until the smoker has stopped smoking for at least 15 years. Yearly screening should be stopped for people who develop a health problem that  would prevent them from having lung cancer treatment.  If you choose to drink alcohol, do not have more than 2 drinks per day. One drink is considered to be 12 ounces (355 mL) of beer, 5 ounces (148 mL) of wine, or 1.5 ounces (44 mL) of liquor.  Avoid use of street drugs. Do not share needles with anyone. Ask for help if you need support or instructions about stopping the use of drugs.  High blood pressure causes heart disease and increases the risk of stroke. Your blood  pressure should be checked at least every 1-2 years. Ongoing high blood pressure should be treated with medicines, if weight loss and exercise are not effective.  If you are 17-16 years old, ask your health care provider if you should take aspirin to prevent heart disease.  Diabetes screening involves taking a blood sample to check your fasting blood sugar level. This should be done once every 3 years, after age 57, if you are within normal weight and without risk factors for diabetes. Testing should be considered at a younger age or be carried out more frequently if you are overweight and have at least 1 risk factor for diabetes.  Colorectal cancer can be detected and often prevented. Most routine colorectal cancer screening begins at the age of 58 and continues through age 60. However, your health care provider may recommend screening at an earlier age if you have risk factors for colon cancer. On a yearly basis, your health care provider may provide home test kits to check for hidden blood in the stool. Use of a small camera at the end of a tube to directly examine the colon (sigmoidoscopy or colonoscopy) can detect the earliest forms of colorectal cancer. Talk to your health care provider about this at age 58, when routine screening begins. Direct exam of the colon should be repeated every 5-10 years through age 94, unless early forms of precancerous polyps or small growths are found.  People who are at an increased risk for hepatitis B should be screened for this virus. You are considered at high risk for hepatitis B if:  You were born in a country where hepatitis B occurs often. Talk with your health care provider about which countries are considered high risk.  Your parents were born in a high-risk country and you have not received a shot to protect against hepatitis B (hepatitis B vaccine).  You have HIV or AIDS.  You use needles to inject street drugs.  You live with, or have sex with,  someone who has hepatitis B.  You are a man who has sex with other men (MSM).  You get hemodialysis treatment.  You take certain medicines for conditions such as cancer, organ transplantation, and autoimmune conditions.  Hepatitis C blood testing is recommended for all people born from [birth year] through 1965 and any individual with known risks for hepatitis C.  Practice safe sex. Use condoms and avoid high-risk sexual practices to reduce the spread of sexually transmitted infections (STIs). STIs include gonorrhea, chlamydia, syphilis, trichomonas, herpes, HPV, and human immunodeficiency virus (HIV). Herpes, HIV, and HPV are viral illnesses that have no cure. They can result in disability, cancer, and death.  If you are at risk of being infected with HIV, it is recommended that you take a prescription medicine daily to prevent HIV infection. This is called preexposure prophylaxis (PrEP). You are considered at risk if:  You are a man who has sex with other men (MSM) and have  other risk factors.  You are a heterosexual man, are sexually active, and are at increased risk for HIV infection.  You take drugs by injection.  You are sexually active with a partner who has HIV.  Talk with your health care provider about whether you are at high risk of being infected with HIV. If you choose to begin PrEP, you should first be tested for HIV. You should then be tested every 3 months for as long as you are taking PrEP.  A one-time screening for abdominal aortic aneurysm (AAA) and surgical repair of large AAAs by ultrasound are recommended for men ages 39 to 27 years who are current or former smokers.  Healthy men should no longer receive prostate-specific antigen (PSA) blood tests as part of routine cancer screening. Talk with your health care provider about prostate cancer screening.  Testicular cancer screening is not recommended for adult males who have no symptoms. Screening includes self-exam, a health  care provider exam, and other screening tests. Consult with your health care provider about any symptoms you have or any concerns you have about testicular cancer.  Use sunscreen. Apply sunscreen liberally and repeatedly throughout the day. You should seek shade when your shadow is shorter than you. Protect yourself by wearing long sleeves, pants, a wide-brimmed hat, and sunglasses year round, whenever you are outdoors.  Once a month, do a whole-body skin exam, using a mirror to look at the skin on your back. Tell your health care provider about new moles, moles that have irregular borders, moles that are larger than a pencil eraser, or moles that have changed in shape or color.  Stay current with required vaccines (immunizations).  Influenza vaccine. All adults should be immunized every year.  Tetanus, diphtheria, and acellular pertussis (Td, Tdap) vaccine. An adult who has not previously received Tdap or who does not know his vaccine status should receive 1 dose of Tdap. This initial dose should be followed by tetanus and diphtheria toxoids (Td) booster doses every 10 years. Adults with an unknown or incomplete history of completing a 3-dose immunization series with Td-containing vaccines should begin or complete a primary immunization series including a Tdap dose. Adults should receive a Td booster every 10 years.  Varicella vaccine. An adult without evidence of immunity to varicella should receive 2 doses or a second dose if he has previously received 1 dose.  Human papillomavirus (HPV) vaccine. Males aged 17-21 years who have not received the vaccine previously should receive the 3-dose series. Males aged 22-26 years may be immunized. Immunization is recommended through the age of 14 years for any male who has sex with males and did not get any or all doses earlier. Immunization is recommended for any person with an immunocompromised condition through the age of 78 years if he did not get any or  all doses earlier. During the 3-dose series, the second dose should be obtained 4-8 weeks after the first dose. The third dose should be obtained 24 weeks after the first dose and 16 weeks after the second dose.  Zoster vaccine. One dose is recommended for adults aged 12 years or older unless certain conditions are present.  Measles, mumps, and rubella (MMR) vaccine. Adults born before [birth year] generally are considered immune to measles and mumps. Adults born in [birth year] or later should have 1 or more doses of MMR vaccine unless there is a contraindication to the vaccine or there is laboratory evidence of immunity to each of the three diseases. A  routine second dose of MMR vaccine should be obtained at least 28 days after the first dose for students attending postsecondary schools, health care workers, or international travelers. People who received inactivated measles vaccine or an unknown type of measles vaccine during 1963-1967 should receive 2 doses of MMR vaccine. People who received inactivated mumps vaccine or an unknown type of mumps vaccine before 1979 and are at high risk for mumps infection should consider immunization with 2 doses of MMR vaccine. Unvaccinated health care workers born before [birth year] who lack laboratory evidence of measles, mumps, or rubella immunity or laboratory confirmation of disease should consider measles and mumps immunization with 2 doses of MMR vaccine or rubella immunization with 1 dose of MMR vaccine.  Pneumococcal 13-valent conjugate (PCV13) vaccine. When indicated, a person who is uncertain of his immunization history and has no record of immunization should receive the PCV13 vaccine. An adult aged 62 years or older who has certain medical conditions and has not been previously immunized should receive 1 dose of PCV13 vaccine. This PCV13 should be followed with a dose of pneumococcal polysaccharide (PPSV23) vaccine. The PPSV23 vaccine dose should be obtained at least 8 weeks after  the dose of PCV13 vaccine. An adult aged 67 years or older who has certain medical conditions and previously received 1 or more doses of PPSV23 vaccine should receive 1 dose of PCV13. The PCV13 vaccine dose should be obtained 1 or more years after the last PPSV23 vaccine dose.  Pneumococcal polysaccharide (PPSV23) vaccine. When PCV13 is also indicated, PCV13 should be obtained first. All adults aged 59 years and older should be immunized. An adult younger than age 75 years who has certain medical conditions should be immunized. Any person who resides in a nursing home or long-term care facility should be immunized. An adult smoker should be immunized. People with an immunocompromised condition and certain other conditions should receive both PCV13 and PPSV23 vaccines. People with human immunodeficiency virus (HIV) infection should be immunized as soon as possible after diagnosis. Immunization during chemotherapy or radiation therapy should be avoided. Routine use of PPSV23 vaccine is not recommended for American Indians, [place] Natives, or people younger than 65 years unless there are medical conditions that require PPSV23 vaccine. When indicated, people who have unknown immunization and have no record of immunization should receive PPSV23 vaccine. One-time revaccination 5 years after the first dose of PPSV23 is recommended for people aged 19-64 years who have chronic kidney failure, nephrotic syndrome, asplenia, or immunocompromised conditions. People who received 1-2 doses of PPSV23 before age 64 years should receive another dose of PPSV23 vaccine at age 67 years or later if at least 5 years have passed since the previous dose. Doses of PPSV23 are not needed for people immunized with PPSV23 at or after age 56 years.  Meningococcal vaccine. Adults with asplenia or persistent complement component deficiencies should receive 2 doses of quadrivalent meningococcal conjugate (MenACWY-D) vaccine. The doses should be  obtained at least 2 months apart. Microbiologists working with certain meningococcal bacteria, [place] recruits, people at risk during an outbreak, and people who travel to or live in countries with a high rate of meningitis should be immunized. A first-year college student up through age 56 years who is living in a residence hall should receive a dose if he did not receive a dose on or after his 16th birthday. Adults who have certain high-risk conditions should receive one or more doses of vaccine.  Hepatitis A vaccine. Adults who wish to  be protected from this disease, have certain high-risk conditions, work with hepatitis A-infected animals, work in hepatitis A research labs, or travel to or work in countries with a high rate of hepatitis A should be immunized. Adults who were previously unvaccinated and who anticipate close contact with an international adoptee during the first 60 days after arrival in the [place] from a country with a high rate of hepatitis A should be immunized.  Hepatitis B vaccine. Adults should be immunized if they wish to be protected from this disease, have certain high-risk conditions, may be exposed to blood or other infectious body fluids, are household contacts or sex partners of hepatitis B positive people, are clients or workers in certain care facilities, or travel to or work in countries with a high rate of hepatitis B.  Haemophilus influenzae type b (Hib) vaccine. A previously unvaccinated person with asplenia or sickle cell disease or having a scheduled splenectomy should receive 1 dose of Hib vaccine. Regardless of previous immunization, a recipient of a hematopoietic stem cell transplant should receive a 3-dose series 6-12 months after his successful transplant. Hib vaccine is not recommended for adults with HIV infection. Preventive Service / Frequency Ages 12 to 60  Blood pressure check.** / Every 1 to 2 years.  Lipid and cholesterol check.** / Every 5  years beginning at age 52.  Hepatitis C blood test.** / For any individual with known risks for hepatitis C.  Skin self-exam. / Monthly.  Influenza vaccine. / Every year.  Tetanus, diphtheria, and acellular pertussis (Tdap, Td) vaccine.** / Consult your health care provider. 1 dose of Td every 10 years.  Varicella vaccine.** / Consult your health care provider.  HPV vaccine. / 3 doses over 6 months, if 78 or younger.  Measles, mumps, rubella (MMR) vaccine.** / You need at least 1 dose of MMR if you were born in 1957 or later. You may also need a second dose.  Pneumococcal 13-valent conjugate (PCV13) vaccine.** / Consult your health care provider.  Pneumococcal polysaccharide (PPSV23) vaccine.** / 1 to 2 doses if you smoke cigarettes or if you have certain conditions.  Meningococcal vaccine.** / 1 dose if you are age 23 to 69 years and a Market researcher living in a residence hall, or have one of several medical conditions. You may also need additional booster doses.  Hepatitis A vaccine.** / Consult your health care provider.  Hepatitis B vaccine.** / Consult your health care provider.  Haemophilus influenzae type b (Hib) vaccine.** / Consult your health care provider. Ages 15 to 47  Blood pressure check.** / Every 1 to 2 years.  Lipid and cholesterol check.** / Every 5 years beginning at age 39.  Lung cancer screening. / Every year if you are aged 57-80 years and have a 30-pack-year history of smoking and currently smoke or have quit within the past 15 years. Yearly screening is stopped once you have quit smoking for at least 15 years or develop a health problem that would prevent you from having lung cancer treatment.  Fecal occult blood test (FOBT) of stool. / Every year beginning at age 61 and continuing until age 30. You may not have to do this test if you get a colonoscopy every 10 years.  Flexible sigmoidoscopy** or colonoscopy.** / Every 5 years for a flexible  sigmoidoscopy or every 10 years for a colonoscopy beginning at age 36 and continuing until age 16.  Hepatitis C blood test.** / For all people born from [birth year]  through 1965 and any individual with known risks for hepatitis C.  Skin self-exam. / Monthly.  Influenza vaccine. / Every year.  Tetanus, diphtheria, and acellular pertussis (Tdap/Td) vaccine.** / Consult your health care provider. 1 dose of Td every 10 years.  Varicella vaccine.** / Consult your health care provider.  Zoster vaccine.** / 1 dose for adults aged 31 years or older.  Measles, mumps, rubella (MMR) vaccine.** / You need at least 1 dose of MMR if you were born in 1956 or later. You may also need a second dose.  Pneumococcal 13-valent conjugate (PCV13) vaccine.** / Consult your health care provider.  Pneumococcal polysaccharide (PPSV23) vaccine.** / 1 to 2 doses if you smoke cigarettes or if you have certain conditions.  Meningococcal vaccine.** / Consult your health care provider.  Hepatitis A vaccine.** / Consult your health care provider.  Hepatitis B vaccine.** / Consult your health care provider.  Haemophilus influenzae type b (Hib) vaccine.** / Consult your health care provider. Ages 64 and over  Blood pressure check.** / Every 1 to 2 years.  Lipid and cholesterol check.**/ Every 5 years beginning at age 63.  Lung cancer screening. / Every year if you are aged 18-80 years and have a 30-pack-year history of smoking and currently smoke or have quit within the past 15 years. Yearly screening is stopped once you have quit smoking for at least 15 years or develop a health problem that would prevent you from having lung cancer treatment.  Fecal occult blood test (FOBT) of stool. / Every year beginning at age 29 and continuing until age 39. You may not have to do this test if you get a colonoscopy every 10 years.  Flexible sigmoidoscopy** or colonoscopy.** / Every 5 years for a flexible sigmoidoscopy or every 10  years for a colonoscopy beginning at age 86 and continuing until age 36.  Hepatitis C blood test.** / For all people born from [birth year] through 1964 and any individual with known risks for hepatitis C.  Abdominal aortic aneurysm (AAA) screening.** / A one-time screening for ages 57 to 68 years who are current or former smokers.  Skin self-exam. / Monthly.  Influenza vaccine. / Every year.  Tetanus, diphtheria, and acellular pertussis (Tdap/Td) vaccine.** / 1 dose of Td every 10 years.  Varicella vaccine.** / Consult your health care provider.  Zoster vaccine.** / 1 dose for adults aged 44 years or older.  Pneumococcal 13-valent conjugate (PCV13) vaccine.** / Consult your health care provider.  Pneumococcal polysaccharide (PPSV23) vaccine.** / 1 dose for all adults aged 63 years and older.  Meningococcal vaccine.** / Consult your health care provider.  Hepatitis A vaccine.** / Consult your health care provider.  Hepatitis B vaccine.** / Consult your health care provider.  Haemophilus influenzae type b (Hib) vaccine.** / Consult your health care provider. **Family history and personal history of risk and conditions may change your health care provider's recommendations. Document Released: 09/18/2001 Document Revised: 07/28/2013 Document Reviewed: 12/18/2010 [facility] Patient Information 2015 [place], [place]. This information is not intended to replace advice given to you by your health care provider. Make sure you discuss any questions you have with your health care provider.

## 2015-01-31 ENCOUNTER — Other Ambulatory Visit: Payer: Self-pay | Admitting: Internal Medicine

## 2015-05-21 ENCOUNTER — Other Ambulatory Visit: Payer: Self-pay | Admitting: Internal Medicine

## 2015-05-23 ENCOUNTER — Other Ambulatory Visit: Payer: BLUE CROSS/BLUE SHIELD

## 2015-05-23 DIAGNOSIS — E119 Type 2 diabetes mellitus without complications: Secondary | ICD-10-CM

## 2015-05-23 DIAGNOSIS — Z0000 Encounter for general adult medical examination without abnormal findings: Secondary | ICD-10-CM

## 2015-05-24 LAB — HEMOGLOBIN A1C
Est. average glucose Bld gHb Est-mCnc: 143 mg/dL
HEMOGLOBIN A1C: 6.6 % — AB (ref 4.8–5.6)

## 2015-05-24 LAB — BASIC METABOLIC PANEL
BUN/Creatinine Ratio: 14 (ref 10–22)
BUN: 13 mg/dL (ref 8–27)
CO2: 21 mmol/L (ref 18–29)
Calcium: 9.1 mg/dL (ref 8.6–10.2)
Chloride: 102 mmol/L (ref 97–106)
Creatinine, Ser: 0.94 mg/dL (ref 0.76–1.27)
GFR calc Af Amer: 99 mL/min/{1.73_m2} (ref >59)
GFR, EST NON AFRICAN AMERICAN: 86 mL/min/{1.73_m2} (ref >59)
GLUCOSE: 107 mg/dL — AB (ref 65–99)
POTASSIUM: 4.6 mmol/L (ref 3.5–5.2)
SODIUM: 138 mmol/L (ref 136–144)

## 2015-05-24 LAB — PSA: Prostate Specific Ag, Serum: 2.2 ng/mL (ref 0.0–4.0)

## 2015-05-26 ENCOUNTER — Ambulatory Visit (INDEPENDENT_AMBULATORY_CARE_PROVIDER_SITE_OTHER): Payer: BLUE CROSS/BLUE SHIELD | Admitting: Nurse Practitioner

## 2015-05-26 ENCOUNTER — Encounter: Payer: Self-pay | Admitting: Nurse Practitioner

## 2015-05-26 VITALS — BP 118/80 | HR 75 | Temp 97.5°F | Resp 12 | Ht 72.0 in | Wt 246.8 lb

## 2015-05-26 DIAGNOSIS — Z23 Encounter for immunization: Secondary | ICD-10-CM

## 2015-05-26 DIAGNOSIS — E669 Obesity, unspecified: Secondary | ICD-10-CM

## 2015-05-26 DIAGNOSIS — I10 Essential (primary) hypertension: Secondary | ICD-10-CM

## 2015-05-26 DIAGNOSIS — E785 Hyperlipidemia, unspecified: Secondary | ICD-10-CM | POA: Diagnosis not present

## 2015-05-26 DIAGNOSIS — E119 Type 2 diabetes mellitus without complications: Secondary | ICD-10-CM | POA: Diagnosis not present

## 2015-05-26 MED ORDER — METFORMIN HCL 500 MG PO TABS: 500.0000 mg | ORAL_TABLET | Freq: Two times a day (BID) | ORAL | Status: DC

## 2015-05-26 NOTE — Patient Instructions (Signed)
Keep up the [place] with diet and exercise!!!  Follow up in 4 month with blood work prior to visit

## 2015-05-26 NOTE — Progress Notes (Signed)
Patient ID: Raymond [name], male   DOB: August 15, 1950, 64 y.o.   MRN: [id number]    PCP: Raymond Chandler, NP  Advanced Directive information Does patient have an advance directive?: No, Would patient like information on creating an advanced directive?: Yes - Educational materials given  No Known Allergies  Chief Complaint  Patient presents with  . Medical Management of Chronic Issues    4 month follow-up, discuss labs (copy printed), DM foot exam due.      HPI: Patient is a 64 y.o. male seen in the office today routine follow up. Hx of DM, HTN, hyperlipidemia.  Discussed PSA- WNL, does notice a weak stream at times, better when he drinks water than with coffee.  Cholesterol taken in June, LDL at goal Attempts heart healthy diet and portion control Has attempted to lose weight and been successful with this. walking some for exercise.  Taking metformin twice daily, no side effects noted  Review of Systems:  Review of Systems  Constitutional: Negative for activity change, appetite change, fatigue and unexpected weight change.  HENT: Negative for congestion and hearing loss.   Eyes: Negative.   Respiratory: Negative for cough and shortness of breath.   Cardiovascular: Negative for chest pain, palpitations and leg swelling.  Gastrointestinal: Negative for abdominal pain, diarrhea and constipation.  Genitourinary: Negative for dysuria and difficulty urinating.  Musculoskeletal: Negative for myalgias and arthralgias.  Skin: Negative for color change and wound.  Neurological: Negative for dizziness and weakness.  Psychiatric/Behavioral: Negative for behavioral problems, confusion and agitation.    Past Medical History  Diagnosis Date  . Problems with sexual function   . Unspecified vitamin D deficiency   . Edema   . Obesity, unspecified   . GERD (gastroesophageal reflux disease)   . Diabetes mellitus without complication ([place])   . Hyperlipidemia   . Hypertension   . Unspecified  sleep apnea   . Sleep apnea     released per MD, no CPAP   Past Surgical History  Procedure Laterality Date  . Cataract extraction  1964    Dr.Freed   . Hernia repair  1987    Right Side, Dr.Kaufman   . Colonoscopy     Social History:   reports that he has never smoked. He has never used smokeless tobacco. He reports that he does not drink alcohol or use illicit drugs.  Family History  Problem Relation Age of Onset  . Colon cancer Mother   . Esophageal cancer Neg Hx   . Stomach cancer Neg Hx   . Rectal cancer Neg Hx     Medications: Patient's Medications  New Prescriptions   No medications on file  Previous Medications   ASPIRIN 81 MG TABLET    Take 81 mg by mouth daily.     ATORVASTATIN (LIPITOR) 40 MG TABLET    TAKE 1 TABLET BY MOUTH EVERY DAY AT BEDTIME   CHOLECALCIFEROL (VITAMIN D) 1000 UNITS TABLET    Take 400 Units by mouth daily. Take 1 tablet daily   DEXTROMETHORPHAN-GUAIFENESIN (MUCINEX DM) 30-600 MG PER 12 HR TABLET    Take 1 tablet by mouth 2 (two) times daily.   ENALAPRIL (VASOTEC) 5 MG TABLET    TAKE 1 TABLET EVERY DAY FOR KIDNEYS   FLUTICASONE (FLONASE) 50 MCG/ACT NASAL SPRAY    Place 2 sprays into both nostrils daily.   GLUCOSE BLOOD TEST STRIP    1 each. Check blood sugar 2-3 x weekly   METFORMIN (GLUCOPHAGE) 500 MG  TABLET    Take 1 tablet (500 mg total) by mouth 2 (two) times daily with a meal.  Modified Medications   No medications on file  Discontinued Medications   ENALAPRIL (VASOTEC) 5 MG TABLET    TAKE 1 TABLET EVERY DAY FOR KIDNEYS.INS WILL PAY FOR 07-06-14     Physical Exam:  Filed Vitals:   05/26/15 0907  BP: 118/80  Pulse: 75  Temp: 97.5 F (36.4 C)  TempSrc: Oral  Resp: 12  Height: 6' (1.829 m)  Weight: 246 lb 12.8 oz (111.948 kg)  SpO2: 95%   Body mass index is 33.46 kg/(m^2).  Physical Exam  Constitutional: He is oriented to person, place, and time. He appears well-developed and well-nourished. No distress.  HENT:  Head:  Normocephalic and atraumatic.  Eyes: Right pupil is not round and not reactive (from childhood injury).  Neck: Normal range of motion. Neck supple.  Cardiovascular: Normal rate, regular rhythm and normal heart sounds.   Pulmonary/Chest: Effort normal and breath sounds normal.  Abdominal: Soft. Bowel sounds are normal.  Musculoskeletal: He exhibits no edema.  Neurological: He is alert and oriented to person, place, and time.  Normal monofilament   Skin: Skin is warm and dry. He is not diaphoretic.  Psychiatric: He has a normal mood and affect.    Labs reviewed: Basic Metabolic Panel:  Recent Labs  07/05/14 0810 01/14/15 0804 05/23/15 0813  NA 137 137 138  K 4.5 4.5 4.6  CL 100 101 102  CO2 21 21 21   GLUCOSE 106* 108* 107*  BUN 14 19 13   CREATININE 0.98 1.00 0.94  CALCIUM 9.1 9.1 9.1   Liver Function Tests:  Recent Labs  07/05/14 0810 01/14/15 0804  AST 26 25  ALT 30 37  ALKPHOS 61 57  BILITOT 1.1 0.6  PROT 6.4 6.7  ALBUMIN 4.2 4.3   No results for input(s): LIPASE, AMYLASE in the last 8760 hours. No results for input(s): AMMONIA in the last 8760 hours. CBC:  Recent Labs  01/14/15 0804  WBC 3.9  NEUTROABS 1.8  HCT 39.9   Lipid Panel:  Recent Labs  07/05/14 0810 01/14/15 0804  CHOL 125 119  HDL 28* 27*  LDLCALC 64 64  TRIG 164* 138  CHOLHDL 4.5 4.4   TSH: No results for input(s): TSH in the last 8760 hours. A1C: Lab Results  Component Value Date   HGBA1C 6.6* 05/23/2015     Assessment/Plan 1. Type 2 diabetes mellitus without complication, without long-term current use of insulin (HCC) -A1c has improved, cont on metformin twice daily, with ACEI  -due for ophthalmology appt, pt plans to make today -foot exam done today, WNL - metFORMIN (GLUCOPHAGE) 500 MG tablet; Take 1 tablet (500 mg total) by mouth 2 (two) times daily with a meal.  Dispense: 60 tablet; Refill: 5  2. Encounter for immunization -influenza given.   3. Essential  hypertension Blood pressure at goal. conts on vasotec daily   4. Hyperlipemia conts to work on heart healthy diet, LDL at goal in June.  -conts on lipitor 40 mg daily   5. Obesity -pt with positive weight loss, to cont healthy diet with decrease portion and increase in exercise  [name] K. Harle Battiest  [facility] [phone] 8 am - 5 pm) [phone] (after hours)

## 2015-05-31 ENCOUNTER — Telehealth: Payer: Self-pay | Admitting: *Deleted

## 2015-05-31 NOTE — Telephone Encounter (Signed)
Received fax from Patient needing the date of his last Annual Age Appropriate Wellness Examination. Filled out and given to [name] to review and sign. To be faxed back to [place] Dept # [phone]

## 2015-07-28 ENCOUNTER — Other Ambulatory Visit: Payer: Self-pay | Admitting: Nurse Practitioner

## 2015-08-17 ENCOUNTER — Encounter: Payer: Self-pay | Admitting: Nurse Practitioner

## 2015-09-27 ENCOUNTER — Other Ambulatory Visit: Payer: BLUE CROSS/BLUE SHIELD

## 2015-09-28 ENCOUNTER — Other Ambulatory Visit: Payer: BLUE CROSS/BLUE SHIELD

## 2015-09-29 ENCOUNTER — Ambulatory Visit: Payer: BLUE CROSS/BLUE SHIELD | Admitting: Nurse Practitioner

## 2015-10-03 ENCOUNTER — Other Ambulatory Visit: Payer: BLUE CROSS/BLUE SHIELD

## 2015-10-03 DIAGNOSIS — E119 Type 2 diabetes mellitus without complications: Secondary | ICD-10-CM

## 2015-10-04 LAB — HEMOGLOBIN A1C
ESTIMATED AVERAGE GLUCOSE: 146 mg/dL
HEMOGLOBIN A1C: 6.7 % — AB (ref 4.8–5.6)

## 2015-10-04 LAB — BASIC METABOLIC PANEL
BUN / CREAT RATIO: 18 (ref 10–22)
BUN: 16 mg/dL (ref 8–27)
CALCIUM: 9.4 mg/dL (ref 8.6–10.2)
CHLORIDE: 100 mmol/L (ref 96–106)
CO2: 20 mmol/L (ref 18–29)
Creatinine, Ser: 0.88 mg/dL (ref 0.76–1.27)
GFR calc Af Amer: 106 mL/min/{1.73_m2} (ref >59)
GFR calc non Af Amer: 91 mL/min/{1.73_m2} (ref >59)
Glucose: 100 mg/dL — ABNORMAL HIGH (ref 65–99)
Potassium: 4.8 mmol/L (ref 3.5–5.2)
SODIUM: 139 mmol/L (ref 134–144)

## 2015-10-06 ENCOUNTER — Encounter: Payer: Self-pay | Admitting: Nurse Practitioner

## 2015-10-06 ENCOUNTER — Ambulatory Visit (INDEPENDENT_AMBULATORY_CARE_PROVIDER_SITE_OTHER): Payer: BLUE CROSS/BLUE SHIELD | Admitting: Nurse Practitioner

## 2015-10-06 VITALS — BP 120/78 | HR 82 | Temp 98.1°F | Resp 20 | Ht 72.0 in | Wt 254.8 lb

## 2015-10-06 DIAGNOSIS — I10 Essential (primary) hypertension: Secondary | ICD-10-CM

## 2015-10-06 DIAGNOSIS — E119 Type 2 diabetes mellitus without complications: Secondary | ICD-10-CM | POA: Diagnosis not present

## 2015-10-06 DIAGNOSIS — E785 Hyperlipidemia, unspecified: Secondary | ICD-10-CM | POA: Diagnosis not present

## 2015-10-06 DIAGNOSIS — S86812A Strain of other muscle(s) and tendon(s) at lower leg level, left leg, initial encounter: Secondary | ICD-10-CM

## 2015-10-06 DIAGNOSIS — S86912A Strain of unspecified muscle(s) and tendon(s) at lower leg level, left leg, initial encounter: Secondary | ICD-10-CM

## 2015-10-06 NOTE — Progress Notes (Signed)
Patient ID: Raymond [name], male   DOB: 1951-02-17, 65 y.o.   MRN: [id number]    PCP: [name], NP  Advanced Directive information Does patient have an advance directive?: No, Would patient like information on creating an advanced directive?: Yes - Educational materials given  No Known Allergies  Chief Complaint  Patient presents with  . Medical Management of Chronic Issues    4 mo f/u     HPI: Patient is a 65 y.o. male seen in the office today for routine follow up. Pt with hx of DM, HTN, hyperlipidemia. Reports left knee pain. Worse at night. Has been going on since last visit. Taking ibuprofen helping knee pain some. Nagging, burning pain. Worse with some positions. 5/10.   conts to take metformin 500 mg twice daily. Diet- some days more compliant than others. Not doing as well as he should.  Exercise is limited due to knee pain.  No constipation or diarrhea.  No changes in vision No numbness or tingling to LE  Review of Systems:  Review of Systems  Constitutional: Negative for activity change, appetite change, fatigue and unexpected weight change.  HENT: Negative for congestion and hearing loss.   Eyes: Negative.   Respiratory: Negative for cough and shortness of breath.   Cardiovascular: Negative for chest pain, palpitations and leg swelling.  Gastrointestinal: Negative for abdominal pain, diarrhea and constipation.  Genitourinary: Negative for dysuria and difficulty urinating.  Musculoskeletal: Positive for myalgias (to left knee).  Skin: Negative for color change and wound.  Neurological: Negative for dizziness and weakness.  Psychiatric/Behavioral: Negative for behavioral problems, confusion and agitation.    Past Medical History  Diagnosis Date  . Problems with sexual function   . Unspecified vitamin D deficiency   . Edema   . Obesity, unspecified   . GERD (gastroesophageal reflux disease)   . Diabetes mellitus without complication ([place])   .  Hyperlipidemia   . Hypertension   . Unspecified sleep apnea   . Sleep apnea     released per MD, no CPAP   Past Surgical History  Procedure Laterality Date  . Cataract extraction  1964    Dr.Freed   . Hernia repair  1987    Right Side, Dr.Kaufman   . Colonoscopy     Social History:   reports that he has never smoked. He has never used smokeless tobacco. He reports that he does not drink alcohol or use illicit drugs.  Family History  Problem Relation Age of Onset  . Colon cancer Mother   . Esophageal cancer Neg Hx   . Stomach cancer Neg Hx   . Rectal cancer Neg Hx     Medications: Patient's Medications  New Prescriptions   No medications on file  Previous Medications   ASPIRIN 81 MG TABLET    Take 81 mg by mouth daily.     ATORVASTATIN (LIPITOR) 40 MG TABLET    TAKE 1 TABLET BY MOUTH EVERY DAY AT BEDTIME   CHOLECALCIFEROL (VITAMIN D) 1000 UNITS TABLET    Take 400 Units by mouth daily. Take 1 tablet daily   DEXTROMETHORPHAN-GUAIFENESIN (MUCINEX DM) 30-600 MG PER 12 HR TABLET    Take 1 tablet by mouth 2 (two) times daily. Reported on 10/06/2015   ENALAPRIL (VASOTEC) 5 MG TABLET    TAKE 1 TABLET EVERY DAY FOR KIDNEYS   FLUTICASONE (FLONASE) 50 MCG/ACT NASAL SPRAY    Place 2 sprays into both nostrils daily. Reported on 10/06/2015   GLUCOSE  BLOOD TEST STRIP    1 each. Check blood sugar 2-3 x weekly   METFORMIN (GLUCOPHAGE) 500 MG TABLET    Take 1 tablet (500 mg total) by mouth 2 (two) times daily with a meal.  Modified Medications   No medications on file  Discontinued Medications   ENALAPRIL (VASOTEC) 5 MG TABLET    TAKE 1 TABLET BY MOUTH EVERY DAY FOR KIDNEYS     Physical Exam:  Filed Vitals:   10/06/15 1533  BP: 120/78  Pulse: 82  Temp: 98.1 F (36.7 C)  TempSrc: Oral  Resp: 20  Height: 6' (1.829 m)  Weight: 254 lb 12.8 oz (115.577 kg)  SpO2: 97%   Body mass index is 34.55 kg/(m^2).  Physical Exam  Constitutional: He is oriented to person, place, and time. He  appears well-developed and well-nourished. No distress.  HENT:  Head: Normocephalic and atraumatic.  Eyes: Right pupil is not round and not reactive (from childhood injury).  Neck: Normal range of motion. Neck supple.  Cardiovascular: Normal rate, regular rhythm and normal heart sounds.   Pulmonary/Chest: Effort normal and breath sounds normal.  Abdominal: Soft. Bowel sounds are normal.  Musculoskeletal: He exhibits tenderness (to medial posterior aspect of left knee, good ROM). He exhibits no edema.       Left knee: He exhibits normal range of motion, no swelling, no effusion, no erythema, normal alignment and no LCL laxity.  Neurological: He is alert and oriented to person, place, and time.  Normal monofilament   Skin: Skin is warm and dry. He is not diaphoretic.  Psychiatric: He has a normal mood and affect.    Labs reviewed: Basic Metabolic Panel:  Recent Labs  01/14/15 0804 05/23/15 0813 10/03/15 0958  NA 137 138 139  K 4.5 4.6 4.8  CL 101 102 100  CO2 21 21 20   GLUCOSE 108* 107* 100*  BUN 19 13 16   CREATININE 1.00 0.94 0.88  CALCIUM 9.1 9.1 9.4   Liver Function Tests:  Recent Labs  01/14/15 0804  AST 25  ALT 37  ALKPHOS 57  BILITOT 0.6  PROT 6.7  ALBUMIN 4.3   No results for input(s): LIPASE, AMYLASE in the last 8760 hours. No results for input(s): AMMONIA in the last 8760 hours. CBC:  Recent Labs  01/14/15 0804  WBC 3.9  NEUTROABS 1.8  HCT 39.9  MCV 80   Lipid Panel:  Recent Labs  01/14/15 0804  CHOL 119  HDL 27*  LDLCALC 64  TRIG 138  CHOLHDL 4.4   TSH: No results for input(s): TSH in the last 8760 hours. A1C: Lab Results  Component Value Date   HGBA1C 6.7* 10/03/2015     Assessment/Plan 1. Type 2 diabetes mellitus without complication, without long-term current use of insulin (HCC) -A1c at goal  -conts on metformin 500 mg twice daily -discussed diet and exercise modifications  - Hemoglobin A1c; Future  2. Essential  hypertension -blood pressure at goal.  -on enalapril 5 mg daily - CBC with Differential; Future  3. Hyperlipemia -LDL at goal in June, conts on lipitor. - to cont diet and exercise modifications  - Lipid panel; Future - Comprehensive metabolic panel; Future  4. Knee strain, left, initial encounter To use aleve routinely twice daily for 1 week Ice knee 20 mins 3 times daily Use muscle rubs (biofreeze, icyhot, bengay) compression as needed Follow up if no improvement or worsening of symptoms  Follow up in 6 months for RV with fasting blood work  prior to visit.   Carlos American. Harle Battiest  [facility] [phone] 8 am - 5 pm) [phone] (after hours)

## 2015-10-06 NOTE — Patient Instructions (Signed)
To use aleve routinely twice daily for 1 week Ice knee 20 mins 3 times daily Use muscle rubs (biofreeze, icyhot, bengay) May cont to use brace as needed Follow up if no improvement or worsening of symptoms   Follow up 6 months for routine follow up with blood work prior to visit

## 2015-11-21 ENCOUNTER — Other Ambulatory Visit: Payer: Self-pay

## 2015-11-21 MED ORDER — ENALAPRIL MALEATE 5 MG PO TABS: ORAL_TABLET | ORAL | Status: DC

## 2015-11-25 ENCOUNTER — Other Ambulatory Visit: Payer: Self-pay | Admitting: Nurse Practitioner

## 2015-12-23 ENCOUNTER — Other Ambulatory Visit: Payer: Self-pay | Admitting: Nurse Practitioner

## 2015-12-28 ENCOUNTER — Other Ambulatory Visit: Payer: Self-pay | Admitting: *Deleted

## 2015-12-28 MED ORDER — ATORVASTATIN CALCIUM 40 MG PO TABS: 40.0000 mg | ORAL_TABLET | Freq: Every day | ORAL | Status: DC

## 2015-12-28 MED ORDER — METFORMIN HCL 500 MG PO TABS: ORAL_TABLET | ORAL | Status: DC

## 2015-12-28 NOTE — Telephone Encounter (Signed)
[facility] fax stating patient requested #[phone]

## 2016-03-23 ENCOUNTER — Other Ambulatory Visit: Payer: Self-pay

## 2016-03-23 DIAGNOSIS — E119 Type 2 diabetes mellitus without complications: Secondary | ICD-10-CM

## 2016-03-23 DIAGNOSIS — I10 Essential (primary) hypertension: Secondary | ICD-10-CM

## 2016-03-23 DIAGNOSIS — E785 Hyperlipidemia, unspecified: Secondary | ICD-10-CM

## 2016-04-06 ENCOUNTER — Other Ambulatory Visit: Payer: BLUE CROSS/BLUE SHIELD

## 2016-04-06 DIAGNOSIS — E785 Hyperlipidemia, unspecified: Secondary | ICD-10-CM

## 2016-04-06 DIAGNOSIS — I10 Essential (primary) hypertension: Secondary | ICD-10-CM

## 2016-04-06 DIAGNOSIS — E119 Type 2 diabetes mellitus without complications: Secondary | ICD-10-CM

## 2016-04-06 LAB — COMPLETE METABOLIC PANEL WITH GFR
ALT: 30 U/L (ref 9–46)
AST: 24 U/L (ref 10–35)
Albumin: 4.3 g/dL (ref 3.6–5.1)
Alkaline Phosphatase: 56 U/L (ref 40–115)
BILIRUBIN TOTAL: 1 mg/dL (ref 0.2–1.2)
BUN: 15 mg/dL (ref 7–25)
CALCIUM: 8.6 mg/dL (ref 8.6–10.3)
CO2: 21 mmol/L (ref 20–31)
CREATININE: 1 mg/dL (ref 0.70–1.25)
Chloride: 107 mmol/L (ref 98–110)
GFR, EST NON AFRICAN AMERICAN: 79 mL/min (ref >=60)
Glucose, Bld: 113 mg/dL — ABNORMAL HIGH (ref 65–99)
Potassium: 4.5 mmol/L (ref 3.5–5.3)
Sodium: 135 mmol/L (ref 135–146)
TOTAL PROTEIN: 6.7 g/dL (ref 6.1–8.1)

## 2016-04-06 LAB — CBC WITH DIFFERENTIAL/PLATELET
BASOS ABS: 0 {cells}/uL (ref 0–200)
BASOS PCT: 0 %
EOS ABS: 164 {cells}/uL (ref 15–500)
Eosinophils Relative: 4 %
HEMATOCRIT: 41 % (ref 38.5–50.0)
Hemoglobin: 13.9 g/dL (ref 13.2–17.1)
LYMPHS PCT: 34 %
Lymphs Abs: 1394 cells/uL (ref 850–3900)
MCH: 27.6 pg (ref 27.0–33.0)
MCHC: 33.9 g/dL (ref 32.0–36.0)
MCV: 81.3 fL (ref 80.0–100.0)
MONO ABS: 533 {cells}/uL (ref 200–950)
MONOS PCT: 13 %
MPV: 9.9 fL (ref 7.5–12.5)
NEUTROS ABS: 2009 {cells}/uL (ref 1500–7800)
Neutrophils Relative %: 49 %
PLATELETS: 208 10*3/uL (ref 140–400)
RBC: 5.04 MIL/uL (ref 4.20–5.80)
RDW: 13.8 % (ref 11.0–15.0)
WBC: 4.1 10*3/uL (ref 3.8–10.8)

## 2016-04-06 LAB — LIPID PANEL
CHOL/HDL RATIO: 3.8 ratio (ref <=5.0)
CHOLESTEROL: 106 mg/dL — AB (ref 125–200)
HDL: 28 mg/dL — ABNORMAL LOW (ref >=40)
LDL Cholesterol: 50 mg/dL (ref <130)
TRIGLYCERIDES: 138 mg/dL (ref <150)
VLDL: 28 mg/dL (ref <30)

## 2016-04-07 LAB — HEMOGLOBIN A1C
Hgb A1c MFr Bld: 6.5 % — ABNORMAL HIGH (ref <5.7)
MEAN PLASMA GLUCOSE: 140 mg/dL

## 2016-04-12 ENCOUNTER — Ambulatory Visit: Payer: BLUE CROSS/BLUE SHIELD | Admitting: Nurse Practitioner

## 2016-04-17 ENCOUNTER — Ambulatory Visit (INDEPENDENT_AMBULATORY_CARE_PROVIDER_SITE_OTHER): Payer: BLUE CROSS/BLUE SHIELD | Admitting: Nurse Practitioner

## 2016-04-17 ENCOUNTER — Encounter: Payer: Self-pay | Admitting: Nurse Practitioner

## 2016-04-17 VITALS — BP 128/88 | HR 86 | Temp 98.2°F | Resp 18 | Ht 72.0 in | Wt 263.4 lb

## 2016-04-17 DIAGNOSIS — E785 Hyperlipidemia, unspecified: Secondary | ICD-10-CM

## 2016-04-17 DIAGNOSIS — Z23 Encounter for immunization: Secondary | ICD-10-CM

## 2016-04-17 DIAGNOSIS — I10 Essential (primary) hypertension: Secondary | ICD-10-CM

## 2016-04-17 DIAGNOSIS — E669 Obesity, unspecified: Secondary | ICD-10-CM | POA: Diagnosis not present

## 2016-04-17 DIAGNOSIS — E119 Type 2 diabetes mellitus without complications: Secondary | ICD-10-CM

## 2016-04-17 NOTE — Progress Notes (Signed)
Careteam: Patient Care Team: [name], NP as PCP - General (Nurse Practitioner) [name], MD as Consulting Physician (Ophthalmology)  Advanced Directive information Does patient have an advance directive?: No, Would patient like information on creating an advanced directive?: Yes - Educational materials given  No Known Allergies  Chief Complaint  Patient presents with  . Medical Management of Chronic Issues    6 month follow up. Labs printed.  . Other    wants flu shot     HPI: Patient is a 65 y.o. male seen in the office today for routine follow up. No major illnesses or hospitalization since last visit.   Left knee- pain has resolved since last visit.   Hyperlipidemia- conts on lipitor LDL 50   Hypertension- taking enalapril, blood pressure well controlled   Diabetes- metformin twice daily- A1c at 6.5, does not feel like he does "great" with diet. Weekends are worse   Review of Systems:  Review of Systems  Constitutional: Negative for activity change, appetite change, fatigue and unexpected weight change.  HENT: Negative for congestion and hearing loss.   Respiratory: Negative for cough and shortness of breath.   Cardiovascular: Negative for chest pain, palpitations and leg swelling.  Gastrointestinal: Negative for abdominal pain, constipation and diarrhea.  Genitourinary: Negative for difficulty urinating and dysuria.  Musculoskeletal: Negative for myalgias.  Skin: Negative for color change and wound.  Neurological: Negative for dizziness and weakness.  Psychiatric/Behavioral: Negative for agitation, behavioral problems and confusion.    Past Medical History:  Diagnosis Date  . Diabetes mellitus without complication ([place])   . Edema   . GERD (gastroesophageal reflux disease)   . Hyperlipidemia   . Hypertension   . Obesity, unspecified   . Problems with sexual function   . Sleep apnea    released per MD, no CPAP  . Unspecified sleep apnea     . Unspecified vitamin D deficiency    Past Surgical History:  Procedure Laterality Date  . CATARACT EXTRACTION  1964   Dr.Freed   . COLONOSCOPY    . HERNIA REPAIR  1987   Right Side, Dr.Kaufman    Social History:   reports that he has never smoked. He has never used smokeless tobacco. He reports that he does not drink alcohol or use drugs.  Family History  Problem Relation Age of Onset  . Colon cancer Mother   . Esophageal cancer Neg Hx   . Stomach cancer Neg Hx   . Rectal cancer Neg Hx     Medications: Patient's Medications  New Prescriptions   No medications on file  Previous Medications   ASPIRIN 81 MG TABLET    Take 81 mg by mouth daily.     ATORVASTATIN (LIPITOR) 40 MG TABLET    Take 1 tablet (40 mg total) by mouth at bedtime.   CHOLECALCIFEROL (VITAMIN D) 1000 UNITS TABLET    Take 400 Units by mouth daily. Take 1 tablet daily   DEXTROMETHORPHAN-GUAIFENESIN (MUCINEX DM) 30-600 MG PER 12 HR TABLET    Take 1 tablet by mouth 2 (two) times daily. Reported on 10/06/2015   ENALAPRIL (VASOTEC) 5 MG TABLET    TAKE 1 TABLET EVERY DAY FOR KIDNEYS   FLUTICASONE (FLONASE) 50 MCG/ACT NASAL SPRAY    Place 2 sprays into both nostrils daily. Reported on 10/06/2015   GLUCOSE BLOOD TEST STRIP    1 each. Check blood sugar 2-3 x weekly   METFORMIN (GLUCOPHAGE) 500 MG TABLET  Take one tablet by mouth twice daily with a meal  Modified Medications   No medications on file  Discontinued Medications   No medications on file     Physical Exam:  Vitals:   04/17/16 1313  BP: 128/88  Pulse: 86  Resp: 18  Temp: 98.2 F (36.8 C)  TempSrc: Oral  SpO2: 97%  Weight: 263 lb 6.4 oz (119.5 kg)  Height: 6' (1.829 m)   Body mass index is 35.72 kg/m.  Physical Exam  Constitutional: He is oriented to person, place, and time. He appears well-developed and well-nourished. No distress.  HENT:  Head: Normocephalic and atraumatic.  Eyes: Right pupil is not round and not reactive (from childhood  injury).  Neck: Normal range of motion. Neck supple.  Cardiovascular: Normal rate, regular rhythm and normal heart sounds.   Pulmonary/Chest: Effort normal and breath sounds normal.  Abdominal: Soft. Bowel sounds are normal.  Musculoskeletal: He exhibits no edema or tenderness.       Left knee: He exhibits normal range of motion, no swelling, no effusion, no erythema, normal alignment and no LCL laxity.  Neurological: He is alert and oriented to person, place, and time.  Normal monofilament   Skin: Skin is warm and dry. He is not diaphoretic.  Psychiatric: He has a normal mood and affect.    Labs reviewed: Basic Metabolic Panel:  Recent Labs  05/23/15 0813 10/03/15 0958 04/06/16 0812  NA 138 139 135  K 4.6 4.8 4.5  CL 102 100 107  CO2 21 20 21   GLUCOSE 107* 100* 113*  BUN 13 16 15   CREATININE 0.94 0.88 1.00  CALCIUM 9.1 9.4 8.6   Liver Function Tests:  Recent Labs  04/06/16 0812  AST 24  ALT 30  ALKPHOS 56  BILITOT 1.0  PROT 6.7  ALBUMIN 4.3   No results for input(s): LIPASE, AMYLASE in the last 8760 hours. No results for input(s): AMMONIA in the last 8760 hours. CBC:  Recent Labs  04/06/16 0812  WBC 4.1  NEUTROABS 2,009  HGB 13.9  HCT 41.0  MCV 81.3  PLT 208   Lipid Panel:  Recent Labs  04/06/16 0812  CHOL 106*  HDL 28*  LDLCALC 50  TRIG 138  CHOLHDL 3.8   TSH: No results for input(s): TSH in the last 8760 hours. A1C: Lab Results  Component Value Date   HGBA1C 6.5 (H) 04/06/2016     Assessment/Plan 1. Type 2 diabetes mellitus without complication, without long-term current use of insulin (HCC) -cont metformin and lifestyle modifications  - BMP with eGFR; Future - Hemoglobin A1c; Future  2. Essential hypertension -stable on enalapril, cont at this time as well as lifestyle modifications  - BMP with eGFR; Future  3. Hyperlipemia LDL controlled on current regimen, cont current medication   4. Obesity, unspecified -discussed need  for weight loss. Making dietary and activity changes   5. Encounter for immunization - Flu Vaccine QUAD 36+ mos IM  Follow up in 3 months sooner if needed   Carmon Sahli K. Harle Battiest  [facility] [phone] 8 am - 5 pm) [phone] (after hours)

## 2016-04-17 NOTE — Patient Instructions (Addendum)
Set up appt with ophthalmology for diabetic eye exam  Exercise 30 mins/5 days a week  Work on diet medications

## 2016-06-20 ENCOUNTER — Other Ambulatory Visit: Payer: Self-pay | Admitting: Nurse Practitioner

## 2016-07-16 ENCOUNTER — Other Ambulatory Visit: Payer: BLUE CROSS/BLUE SHIELD

## 2016-07-16 ENCOUNTER — Other Ambulatory Visit: Payer: Self-pay | Admitting: Nurse Practitioner

## 2016-07-16 DIAGNOSIS — E119 Type 2 diabetes mellitus without complications: Secondary | ICD-10-CM

## 2016-07-16 DIAGNOSIS — I10 Essential (primary) hypertension: Secondary | ICD-10-CM

## 2016-07-16 LAB — BASIC METABOLIC PANEL WITH GFR
BUN: 20 mg/dL (ref 7–25)
CHLORIDE: 106 mmol/L (ref 98–110)
CO2: 24 mmol/L (ref 20–31)
Calcium: 9.1 mg/dL (ref 8.6–10.3)
Creat: 0.97 mg/dL (ref 0.70–1.25)
GFR, Est Non African American: 82 mL/min (ref >=60)
GLUCOSE: 126 mg/dL — AB (ref 65–99)
POTASSIUM: 4.5 mmol/L (ref 3.5–5.3)
Sodium: 137 mmol/L (ref 135–146)

## 2016-07-16 LAB — HEMOGLOBIN A1C
Hgb A1c MFr Bld: 6.6 % — ABNORMAL HIGH (ref <5.7)
Mean Plasma Glucose: 143 mg/dL

## 2016-07-17 ENCOUNTER — Other Ambulatory Visit: Payer: BLUE CROSS/BLUE SHIELD

## 2016-07-19 ENCOUNTER — Encounter: Payer: Self-pay | Admitting: Nurse Practitioner

## 2016-07-19 ENCOUNTER — Ambulatory Visit (INDEPENDENT_AMBULATORY_CARE_PROVIDER_SITE_OTHER): Payer: BLUE CROSS/BLUE SHIELD | Admitting: Nurse Practitioner

## 2016-07-19 VITALS — BP 118/84 | HR 72 | Temp 97.5°F | Resp 18 | Ht 72.0 in | Wt 262.4 lb

## 2016-07-19 DIAGNOSIS — L84 Corns and callosities: Secondary | ICD-10-CM

## 2016-07-19 DIAGNOSIS — Z23 Encounter for immunization: Secondary | ICD-10-CM

## 2016-07-19 DIAGNOSIS — Z1159 Encounter for screening for other viral diseases: Secondary | ICD-10-CM

## 2016-07-19 DIAGNOSIS — E119 Type 2 diabetes mellitus without complications: Secondary | ICD-10-CM

## 2016-07-19 DIAGNOSIS — E785 Hyperlipidemia, unspecified: Secondary | ICD-10-CM | POA: Diagnosis not present

## 2016-07-19 DIAGNOSIS — Z0000 Encounter for general adult medical examination without abnormal findings: Secondary | ICD-10-CM | POA: Diagnosis not present

## 2016-07-19 LAB — HEPATITIS C ANTIBODY: HCV Ab: NEGATIVE

## 2016-07-19 NOTE — Progress Notes (Signed)
Provider: [name], NP  Patient Care Team: [name], NP as PCP - General (Nurse Practitioner) [name], MD as Consulting Physician (Ophthalmology)  Extended Emergency Contact Information Primary Emergency Contact: [name] Address: [place],  [place] Home Phone: [phone] Relation: None No Known Allergies Code Status: FULL  Goals of Care: Advanced Directive information Advanced Directives 07/19/2016  Does Patient Have a Medical Advance Directive? No  Would patient like information on creating a medical advance directive? Yes (MAU/Ambulatory/Procedural Areas - Information given)     Chief Complaint  Patient presents with  . Medical Management of Chronic Issues    Complete physical.   . Other    Wants prevnar 13    HPI: Patient is a 65 y.o. male seen in today for an annual wellness exam.   No major illnesses or hospitalization in the last year   Depression screen [facility] 2/9 07/19/2016 04/17/2016 01/20/2015 12/01/2013 11/17/2012  Decreased Interest 0 0 0 0 0  Down, Depressed, Hopeless 0 0 0 0 -  PHQ - 2 Score 0 0 0 0 0    Fall Risk  07/19/2016 04/17/2016 10/06/2015 05/26/2015 01/20/2015  Falls in the past year? No No No No No   No flowsheet data found.   Health Maintenance  Topic Date Due  . Hepatitis C Screening  01-31-1951  . HIV Screening  01/09/1967  . OPHTHALMOLOGY EXAM  05/07/2015  . HEMOGLOBIN A1C  01/14/2017  . FOOT EXAM  04/17/2017  . COLONOSCOPY  02/10/2019  . TETANUS/TDAP  12/02/2023  . INFLUENZA VACCINE  Completed  . ZOSTAVAX  Completed    Diet? 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   Dentition: has seen dentist, was overdue.   Pain: none at this time. Foot has a corn which bothers him.  Has order work shoes that he is hoping will  help  Past Medical History:  Diagnosis Date  . Diabetes mellitus without complication ([place])   . Edema   . GERD (gastroesophageal reflux disease)   . Hyperlipidemia   . Hypertension   . Obesity, unspecified   . Problems with sexual function   . Sleep apnea    released per MD, no CPAP  . Unspecified sleep apnea   . Unspecified vitamin D deficiency     Past Surgical History:  Procedure Laterality Date  . CATARACT EXTRACTION  1964   Dr.Freed   . COLONOSCOPY    . HERNIA REPAIR  1987   Right Side, Dr.Kaufman     Social History   Social History  . Marital status: Married    Spouse name: N/A  . Number of children: N/A  . Years of education: N/A   Social History Main Topics  . Smoking status: Never Smoker  . Smokeless tobacco: Never Used  . Alcohol use No  . Drug use: No  . Sexual activity: Yes   Other Topics Concern  . None   Social History Narrative  . None    Family History  Problem Relation Age of Onset  . Colon cancer Mother   . Esophageal cancer Neg Hx   . Stomach cancer Neg Hx   . Rectal cancer Neg Hx     Review of Systems:  Review of  Systems  Constitutional: Negative for activity change, appetite change, fatigue and unexpected weight change.  HENT: Negative for congestion and hearing loss.   Respiratory: Negative for cough and shortness of breath.   Cardiovascular: Negative for chest pain, palpitations and leg swelling.  Gastrointestinal: Negative for abdominal pain, constipation and diarrhea.  Genitourinary: Negative for difficulty urinating and dysuria.  Musculoskeletal: Negative for myalgias.  Skin: Negative for color change and wound.       Callus on bottom of left foot which is painful  Neurological: Negative for dizziness and weakness.  Psychiatric/Behavioral: Negative for agitation, behavioral problems and confusion.       Medication List       Accurate as of 07/19/16 11:12 AM. Always use your most recent med list.           aspirin 81 MG tablet Take 81 mg by mouth daily.   atorvastatin 40 MG tablet Commonly known as:  LIPITOR Take 1 tablet (40 mg total) by mouth at bedtime.   cholecalciferol 1000 units tablet Commonly known as:  VITAMIN D Take 400 Units by mouth daily. Take 1 tablet daily   enalapril 5 MG tablet Commonly known as:  VASOTEC TAKE 1 TABLET BY MOUTH EVERY DAY   glucose blood test strip 1 each. Check blood sugar 2-3 x weekly   metFORMIN 500 MG tablet Commonly known as:  GLUCOPHAGE Take one tablet by mouth twice daily with a meal         Physical Exam: Vitals:   07/19/16 1107  BP: 118/84  Pulse: 72  Resp: 18  Temp: 97.5 F (36.4 C)  TempSrc: Oral  SpO2: 96%  Weight: 262 lb 6.4 oz (119 kg)  Height: 6' (1.829 m)   Body mass index is 35.59 kg/m. Physical Exam  Constitutional: He is oriented to person, place, and time. He appears well-developed and well-nourished. No distress.  HENT:  Head: Normocephalic and atraumatic.  Right Ear: External ear normal.  Left Ear: External ear normal.  Nose: Nose normal.  Mouth/Throat: Oropharynx is clear and moist. No oropharyngeal exudate.  Eyes: Conjunctivae are normal. Right pupil not reactive: from childhood injury.  Neck: Normal range of motion. Neck supple.  Cardiovascular: Normal rate, regular rhythm, normal heart sounds and intact distal pulses.   Pulmonary/Chest: Effort normal and breath sounds normal.  Abdominal: Soft. Bowel sounds are normal. He exhibits no distension. There is no tenderness.  Musculoskeletal: He exhibits no edema or tenderness.       Left knee: He exhibits normal range of motion, no swelling, no effusion, no erythema, normal alignment and no LCL laxity.  Neurological: He is alert and oriented to person, place, and time. He has normal reflexes.  Normal monofilament   Skin: Skin is warm and dry. He is not diaphoretic.  Psychiatric: He has a normal mood and affect. His behavior is normal.    Labs  reviewed: Basic Metabolic Panel:  Recent Labs  10/03/15 0958 04/06/16 0812 07/16/16 0830  NA 139 135 137  K 4.8 4.5 4.5  CL 100 107 106  CO2 20 21 24   GLUCOSE 100* 113* 126*  BUN 16 15 20   CREATININE 0.88 1.00 0.97  CALCIUM 9.4 8.6 9.1   Liver Function Tests:  Recent Labs  04/06/16 0812  AST 24  ALT 30  ALKPHOS 56  BILITOT 1.0  PROT 6.7  ALBUMIN 4.3   No results for input(s): LIPASE, AMYLASE in the last 8760 hours. No results for input(s): AMMONIA in the last  8760 hours. CBC:  Recent Labs  04/06/16 0812  WBC 4.1  NEUTROABS 2,009  HGB 13.9  HCT 41.0  MCV 81.3  PLT 208   Lipid Panel:  Recent Labs  04/06/16 0812  CHOL 106*  HDL 28*  LDLCALC 50  TRIG 138  CHOLHDL 3.8   Lab Results  Component Value Date   HGBA1C 6.6 (H) 07/16/2016    Procedures: No results found.  Assessment/Plan 1. Type 2 diabetes mellitus without complication, without long-term current use of insulin (HCC) A1c at goal. conts on metformin. Cont dietary and lifestyle modifications.   2. Corns and callus Corn noted to left foot, plantar surface under great toe sharply debrided with immediate relief.  3. Hyperlipidemia, unspecified hyperlipidemia type LDL in good control in sept. conts on Lipitor daily. Lifestyle modifications encouraged  4. Preventative health care The patient was counseled regarding the appropriate use of alcohol, regular self-examination of the breasts on a monthly basis, prevention of dental and periodontal disease, diet, regular sustained exercise for at least 30 minutes 5 times per week, schedule for GI hemoccult testing, colonoscopy, cholesterol, thyroid and diabetes screening. -ophthalmology follow up needed.   5. Need for hepatitis C screening test -screening for hep C bloodwork added on.  6. Need for pneumococcal vaccine - Pneumococcal conjugate vaccine 13-valent  Jessica K. Harle Battiest  [facility] [phone] 8 am -  5 pm) [phone] (after hours)

## 2016-07-19 NOTE — Patient Instructions (Addendum)
Make sure you set up your eye doctor appt    Health Maintenance, Male A healthy lifestyle and preventative care can promote health and wellness.  Maintain regular health, dental, and eye exams.  Eat a healthy diet. Foods like vegetables, fruits, whole grains, low-fat dairy products, and lean protein foods contain the nutrients you need and are low in calories. Decrease your intake of foods high in solid fats, added sugars, and salt. Get information about a proper diet from your health care provider, if necessary.  Regular physical exercise is one of the most important things you can do for your health. Most adults should get at least 150 minutes of moderate-intensity exercise (any activity that increases your heart rate and causes you to sweat) each week. In addition, most adults need muscle-strengthening exercises on 2 or more days a week.   Maintain a healthy weight. The body mass index (BMI) is a screening tool to identify possible weight problems. It provides an estimate of body fat based on height and weight. Your health care provider can find your BMI and can help you achieve or maintain a healthy weight. For males 20 years and older:  A BMI below 18.5 is considered underweight.  A BMI of 18.5 to 24.9 is normal.  A BMI of 25 to 29.9 is considered overweight.  A BMI of 30 and above is considered obese.  Maintain normal blood lipids and cholesterol by exercising and minimizing your intake of saturated fat. Eat a balanced diet with plenty of fruits and vegetables. Blood tests for lipids and cholesterol should begin at age 1 and be repeated every 5 years. If your lipid or cholesterol levels are high, you are over age 18, or you are at high risk for heart disease, you may need your cholesterol levels checked more frequently.Ongoing high lipid and cholesterol levels should be treated with medicines if diet and exercise are not working.  If you smoke, find out from your health care provider  how to quit. If you do not use tobacco, do not start.  Lung cancer screening is recommended for adults aged 75-80 years who are at high risk for developing lung cancer because of a history of smoking. A yearly low-dose CT scan of the lungs is recommended for people who have at least a 30-pack-year history of smoking and are current smokers or have quit within the past 15 years. A pack year of smoking is smoking an average of 1 pack of cigarettes a day for 1 year (for example, a 30-pack-year history of smoking could mean smoking 1 pack a day for 30 years or 2 packs a day for 15 years). Yearly screening should continue until the smoker has stopped smoking for at least 15 years. Yearly screening should be stopped for people who develop a health problem that would prevent them from having lung cancer treatment.  If you choose to drink alcohol, do not have more than 2 drinks per day. One drink is considered to be 12 oz (360 mL) of beer, 5 oz (150 mL) of wine, or 1.5 oz (45 mL) of liquor.  Avoid the use of street drugs. Do not share needles with anyone. Ask for help if you need support or instructions about stopping the use of drugs.  High blood pressure causes heart disease and increases the risk of stroke. High blood pressure is more likely to develop in:  People who have blood pressure in the end of the normal range (100-139/85-89 mm Hg).  People  who are overweight or obese.  People who are African American.  If you are 69-6 years of age, have your blood pressure checked every 3-5 years. If you are 56 years of age or older, have your blood pressure checked every year. You should have your blood pressure measured twice-once when you are at a hospital or clinic, and once when you are not at a hospital or clinic. Record the average of the two measurements. To check your blood pressure when you are not at a hospital or clinic, you can use:  An automated blood pressure machine at a pharmacy.  A home  blood pressure monitor.  If you are 60-78 years old, ask your health care provider if you should take aspirin to prevent heart disease.  Diabetes screening involves taking a blood sample to check your fasting blood sugar level. This should be done once every 3 years after age 62 if you are at a normal weight and without risk factors for diabetes. Testing should be considered at a younger age or be carried out more frequently if you are overweight and have at least 1 risk factor for diabetes.  Colorectal cancer can be detected and often prevented. Most routine colorectal cancer screening begins at the age of 55 and continues through age 70. However, your health care provider may recommend screening at an earlier age if you have risk factors for colon cancer. On a yearly basis, your health care provider may provide home test kits to check for hidden blood in the stool. A small camera at the end of a tube may be used to directly examine the colon (sigmoidoscopy or colonoscopy) to detect the earliest forms of colorectal cancer. Talk to your health care provider about this at age 39 when routine screening begins. A direct exam of the colon should be repeated every 5-10 years through age 51, unless early forms of precancerous polyps or small growths are found.  People who are at an increased risk for hepatitis B should be screened for this virus. You are considered at high risk for hepatitis B if:  You were born in a country where hepatitis B occurs often. Talk with your health care provider about which countries are considered high risk.  Your parents were born in a high-risk country and you have not received a shot to protect against hepatitis B (hepatitis B vaccine).  You have HIV or AIDS.  You use needles to inject street drugs.  You live with, or have sex with, someone who has hepatitis B.  You are a man who has sex with other men (MSM).  You get hemodialysis treatment.  You take certain  medicines for conditions like cancer, organ transplantation, and autoimmune conditions.  Hepatitis C blood testing is recommended for all people born from [birth year] through 1964 and any individual with known risk factors for hepatitis C.  Healthy men should no longer receive prostate-specific antigen (PSA) blood tests as part of routine cancer screening. Talk to your health care provider about prostate cancer screening.  Testicular cancer screening is not recommended for adolescents or adult males who have no symptoms. Screening includes self-exam, a health care provider exam, and other screening tests. Consult with your health care provider about any symptoms you have or any concerns you have about testicular cancer.  Practice safe sex. Use condoms and avoid high-risk sexual practices to reduce the spread of sexually transmitted infections (STIs).  You should be screened for STIs, including gonorrhea and chlamydia if:  You are sexually active and are younger than 24 years.  You are older than 24 years, and your health care provider tells you that you are at risk for this type of infection.  Your sexual activity has changed since you were last screened, and you are at an increased risk for chlamydia or gonorrhea. Ask your health care provider if you are at risk.  If you are at risk of being infected with HIV, it is recommended that you take a prescription medicine daily to prevent HIV infection. This is called pre-exposure prophylaxis (PrEP). You are considered at risk if:  You are a man who has sex with other men (MSM).  You are a heterosexual man who is sexually active with multiple partners.  You take drugs by injection.  You are sexually active with a partner who has HIV.  Talk with your health care provider about whether you are at high risk of being infected with HIV. If you choose to begin PrEP, you should first be tested for HIV. You should then be tested every 3 months for as long as  you are taking PrEP.  Use sunscreen. Apply sunscreen liberally and repeatedly throughout the day. You should seek shade when your shadow is shorter than you. Protect yourself by wearing long sleeves, pants, a wide-brimmed hat, and sunglasses year round whenever you are outdoors.  Tell your health care provider of new moles or changes in moles, especially if there is a change in shape or color. Also, tell your health care provider if a mole is larger than the size of a pencil eraser.  A one-time screening for abdominal aortic aneurysm (AAA) and surgical repair of large AAAs by ultrasound is recommended for men aged 29-75 years who are current or former smokers.  Stay current with your vaccines (immunizations). This information is not intended to replace advice given to you by your health care provider. Make sure you discuss any questions you have with your health care provider. Document Released: 01/19/2008 Document Revised: 08/13/2014 Document Reviewed: 04/26/2015 Elsevier Interactive Patient Education  2017 [employer].

## 2016-11-29 ENCOUNTER — Telehealth: Payer: Self-pay

## 2016-11-29 NOTE — Telephone Encounter (Signed)
Patient has a lab appointment 01/17/17 but has no labs ordered.   Please verify that patient needs labs and place orders.

## 2016-12-10 ENCOUNTER — Other Ambulatory Visit: Payer: Self-pay | Admitting: Nurse Practitioner

## 2016-12-10 DIAGNOSIS — E785 Hyperlipidemia, unspecified: Secondary | ICD-10-CM

## 2016-12-10 DIAGNOSIS — E119 Type 2 diabetes mellitus without complications: Secondary | ICD-10-CM

## 2016-12-10 DIAGNOSIS — I10 Essential (primary) hypertension: Secondary | ICD-10-CM

## 2016-12-10 NOTE — Telephone Encounter (Signed)
done

## 2016-12-19 ENCOUNTER — Other Ambulatory Visit: Payer: Self-pay | Admitting: Nurse Practitioner

## 2017-01-14 LAB — HM DIABETES EYE EXAM

## 2017-01-15 ENCOUNTER — Other Ambulatory Visit: Payer: BLUE CROSS/BLUE SHIELD

## 2017-01-16 ENCOUNTER — Encounter: Payer: Self-pay | Admitting: *Deleted

## 2017-01-16 ENCOUNTER — Other Ambulatory Visit: Payer: BLUE CROSS/BLUE SHIELD

## 2017-01-16 DIAGNOSIS — E119 Type 2 diabetes mellitus without complications: Secondary | ICD-10-CM

## 2017-01-16 DIAGNOSIS — E785 Hyperlipidemia, unspecified: Secondary | ICD-10-CM

## 2017-01-16 DIAGNOSIS — I10 Essential (primary) hypertension: Secondary | ICD-10-CM

## 2017-01-16 LAB — COMPLETE METABOLIC PANEL WITH GFR
ALBUMIN: 4.2 g/dL (ref 3.6–5.1)
ALK PHOS: 57 U/L (ref 40–115)
ALT: 35 U/L (ref 9–46)
AST: 23 U/L (ref 10–35)
BUN: 17 mg/dL (ref 7–25)
CALCIUM: 9.1 mg/dL (ref 8.6–10.3)
CO2: 23 mmol/L (ref 20–31)
CREATININE: 1.01 mg/dL (ref 0.70–1.25)
Chloride: 104 mmol/L (ref 98–110)
GFR, Est Non African American: 78 mL/min (ref >=60)
Glucose, Bld: 124 mg/dL — ABNORMAL HIGH (ref 65–99)
POTASSIUM: 4.8 mmol/L (ref 3.5–5.3)
Sodium: 136 mmol/L (ref 135–146)
Total Bilirubin: 0.6 mg/dL (ref 0.2–1.2)
Total Protein: 6.6 g/dL (ref 6.1–8.1)

## 2017-01-16 LAB — CBC WITH DIFFERENTIAL/PLATELET
BASOS PCT: 0 %
Basophils Absolute: 0 cells/uL (ref 0–200)
EOS PCT: 5 %
Eosinophils Absolute: 195 cells/uL (ref 15–500)
HEMATOCRIT: 41.2 % (ref 38.5–50.0)
Hemoglobin: 13.5 g/dL (ref 13.2–17.1)
LYMPHS PCT: 36 %
Lymphs Abs: 1404 cells/uL (ref 850–3900)
MCH: 26.6 pg — ABNORMAL LOW (ref 27.0–33.0)
MCHC: 32.8 g/dL (ref 32.0–36.0)
MCV: 81.3 fL (ref 80.0–100.0)
MPV: 9.9 fL (ref 7.5–12.5)
Monocytes Absolute: 507 cells/uL (ref 200–950)
Monocytes Relative: 13 %
NEUTROS PCT: 46 %
Neutro Abs: 1794 cells/uL (ref 1500–7800)
PLATELETS: 211 10*3/uL (ref 140–400)
RBC: 5.07 MIL/uL (ref 4.20–5.80)
RDW: 15.1 % — AB (ref 11.0–15.0)
WBC: 3.9 10*3/uL (ref 3.8–10.8)

## 2017-01-16 LAB — LIPID PANEL
CHOLESTEROL: 113 mg/dL (ref <200)
HDL: 25 mg/dL — AB (ref >40)
LDL Cholesterol: 59 mg/dL (ref <100)
TRIGLYCERIDES: 143 mg/dL (ref <150)
Total CHOL/HDL Ratio: 4.5 Ratio (ref <5.0)
VLDL: 29 mg/dL (ref <30)

## 2017-01-17 ENCOUNTER — Ambulatory Visit (INDEPENDENT_AMBULATORY_CARE_PROVIDER_SITE_OTHER): Payer: BLUE CROSS/BLUE SHIELD | Admitting: Nurse Practitioner

## 2017-01-17 ENCOUNTER — Encounter: Payer: Self-pay | Admitting: Nurse Practitioner

## 2017-01-17 VITALS — BP 124/74 | HR 77 | Temp 97.4°F | Resp 18 | Ht 72.0 in | Wt 277.0 lb

## 2017-01-17 DIAGNOSIS — I10 Essential (primary) hypertension: Secondary | ICD-10-CM | POA: Diagnosis not present

## 2017-01-17 DIAGNOSIS — E66812 Obesity, class 2: Secondary | ICD-10-CM

## 2017-01-17 DIAGNOSIS — E119 Type 2 diabetes mellitus without complications: Secondary | ICD-10-CM

## 2017-01-17 DIAGNOSIS — E559 Vitamin D deficiency, unspecified: Secondary | ICD-10-CM | POA: Diagnosis not present

## 2017-01-17 DIAGNOSIS — Z6837 Body mass index (BMI) 37.0-37.9, adult: Secondary | ICD-10-CM | POA: Diagnosis not present

## 2017-01-17 DIAGNOSIS — E785 Hyperlipidemia, unspecified: Secondary | ICD-10-CM

## 2017-01-17 LAB — HEMOGLOBIN A1C
HEMOGLOBIN A1C: 7.2 % — AB (ref <5.7)
Mean Plasma Glucose: 160 mg/dL

## 2017-01-17 MED ORDER — ZOSTER VAC RECOMB ADJUVANTED 50 MCG/0.5ML IM SUSR: 0.5000 mL | Freq: Once | INTRAMUSCULAR | 1 refills | Status: AC

## 2017-01-17 NOTE — Patient Instructions (Addendum)
Cut out sweets and "snacky" foods during the week.  Make sure the food you eat has good nutritional value  Exercise- 30 mins/5 days a week     Obesity, Adult Obesity is the condition of having too much total body fat. Being overweight or obese means that your weight is greater than what is considered healthy for your body size. Obesity is determined by a measurement called BMI. BMI is an estimate of body fat and is calculated from height and weight. For adults, a BMI of 30 or higher is considered obese. Obesity can eventually lead to other health concerns and major illnesses, including:  Stroke.  Coronary artery disease (CAD).  Type 2 diabetes.  Some types of cancer, including cancers of the colon, breast, uterus, and gallbladder.  Osteoarthritis.  High blood pressure (hypertension).  High cholesterol.  Sleep apnea.  Gallbladder stones.  Infertility problems.  What are the causes? The main cause of obesity is taking in (consuming) more calories than your body uses for energy. Other factors that contribute to this condition may include:  Being born with genes that make you more likely to become obese.  Having a medical condition that causes obesity. These conditions include: ? Hypothyroidism. ? Polycystic ovarian syndrome (PCOS). ? Binge-eating disorder. ? Cushing syndrome.  Taking certain medicines, such as steroids, antidepressants, and seizure medicines.  Not being physically active (sedentary lifestyle).  Living where there are limited places to exercise safely or buy healthy foods.  Not getting enough sleep.  What increases the risk? The following factors may increase your risk of this condition:  Having a family history of obesity.  Being a woman of African-American descent.  Being a man of Hispanic descent.  What are the signs or symptoms? Having excessive body fat is the main symptom of this condition. How is this diagnosed? This condition may be  diagnosed based on:  Your symptoms.  Your medical history.  A physical exam. Your health care provider may measure: ? Your BMI. If you are an adult with a BMI between 25 and less than 30, you are considered overweight. If you are an adult with a BMI of 30 or higher, you are considered obese. ? The distances around your hips and your waist (circumferences). These may be compared to each other to help diagnose your condition. ? Your skinfold thickness. Your health care provider may gently pinch a fold of your skin and measure it.  How is this treated? Treatment for this condition often includes changing your lifestyle. Treatment may include some or all of the following:  Dietary changes. Work with your health care provider and a dietitian to set a weight-loss goal that is healthy and reasonable for you. Dietary changes may include eating: ? Smaller portions. A portion size is the amount of a particular food that is healthy for you to eat at one time. This varies from person to person. ? Low-calorie or low-fat options. ? More whole grains, fruits, and vegetables.  Regular physical activity. This may include aerobic activity (cardio) and strength training.  Medicine to help you lose weight. Your health care provider may prescribe medicine if you are unable to lose 1 pound a week after 6 weeks of eating more healthily and doing more physical activity.  Surgery. Surgical options may include gastric banding and gastric bypass. Surgery may be done if: ? Other treatments have not helped to improve your condition. ? You have a BMI of 40 or higher. ? You have life-threatening health  problems related to obesity.  Follow these instructions at home:  Eating and drinking   Follow recommendations from your health care provider about what you eat and drink. Your health care provider may advise you to: ? Limit fast foods, sweets, and processed snack foods. ? Choose low-fat options, such as low-fat  milk instead of whole milk. ? Eat 5 or more servings of fruits or vegetables every day. ? Eat at home more often. This gives you more control over what you eat. ? Choose healthy foods when you eat out. ? Learn what a healthy portion size is. ? Keep low-fat snacks on hand. ? Avoid sugary drinks, such as soda, fruit juice, iced tea sweetened with sugar, and flavored milk. ? Eat a healthy breakfast.  Drink enough water to keep your urine clear or pale yellow.  Do not go without eating for long periods of time (do not fast) or follow a fad diet. Fasting and fad diets can be unhealthy and even dangerous. Physical Activity  Exercise regularly, as told by your health care provider. Ask your health care provider what types of exercise are safe for you and how often you should exercise.  Warm up and stretch before being active.  Cool down and stretch after being active.  Rest between periods of activity. Lifestyle  Limit the time that you spend in front of your TV, computer, or video game system.  Find ways to reward yourself that do not involve food.  Limit alcohol intake to no more than 1 drink a day for nonpregnant women and 2 drinks a day for men. One drink equals 12 oz of beer, 5 oz of wine, or 1 oz of hard liquor. General instructions  Keep a weight loss journal to keep track of the food you eat and how much you exercise you get.  Take over-the-counter and prescription medicines only as told by your health care provider.  Take vitamins and supplements only as told by your health care provider.  Consider joining a support group. Your health care provider may be able to recommend a support group.  Keep all follow-up visits as told by your health care provider. This is important. Contact a health care provider if:  You are unable to meet your weight loss goal after 6 weeks of dietary and lifestyle changes. This information is not intended to replace advice given to you by your  health care provider. Make sure you discuss any questions you have with your health care provider. Document Released: 08/30/2004 Document Revised: 12/26/2015 Document Reviewed: 05/11/2015 Elsevier Interactive Patient Education  2017 [place] DASH stands for "Dietary Approaches to Stop Hypertension." The DASH eating plan is a healthy eating plan that has been shown to reduce high blood pressure (hypertension). It may also reduce your risk for type 2 diabetes, heart disease, and stroke. The DASH eating plan may also help with weight loss. What are tips for following this plan? General guidelines  Avoid eating more than 2,300 mg (milligrams) of salt (sodium) a day. If you have hypertension, you may need to reduce your sodium intake to 1,500 mg a day.  Limit alcohol intake to no more than 1 drink a day for nonpregnant women and 2 drinks a day for men. One drink equals 12 oz of beer, 5 oz of wine, or 1 oz of hard liquor.  Work with your health care provider to maintain a healthy body weight or to lose weight. Ask what an  ideal weight is for you.  Get at least 30 minutes of exercise that causes your heart to beat faster (aerobic exercise) most days of the week. Activities may include walking, swimming, or biking.  Work with your health care provider or diet and nutrition specialist (dietitian) to adjust your eating plan to your individual calorie needs. Reading food labels  Check food labels for the amount of sodium per serving. Choose foods with less than 5 percent of the Daily Value of sodium. Generally, foods with less than 300 mg of sodium per serving fit into this eating plan.  To find whole grains, look for the word "whole" as the first word in the ingredient list. Shopping  Buy products labeled as "low-sodium" or "no salt added."  Buy fresh foods. Avoid canned foods and premade or frozen meals. Cooking  Avoid adding salt when cooking. Use salt-free seasonings  or herbs instead of table salt or sea salt. Check with your health care provider or pharmacist before using salt substitutes.  Do not fry foods. Cook foods using healthy methods such as baking, boiling, grilling, and broiling instead.  Cook with heart-healthy oils, such as olive, canola, soybean, or sunflower oil. Meal planning   Eat a balanced diet that includes: ? 5 or more servings of fruits and vegetables each day. At each meal, try to fill half of your plate with fruits and vegetables. ? Up to 6-8 servings of whole grains each day. ? Less than 6 oz of lean meat, poultry, or fish each day. A 3-oz serving of meat is about the same size as a deck of cards. One egg equals 1 oz. ? 2 servings of low-fat dairy each day. ? A serving of nuts, seeds, or beans 5 times each week. ? Heart-healthy fats. Healthy fats called Omega-3 fatty acids are found in foods such as flaxseeds and coldwater fish, like sardines, salmon, and mackerel.  Limit how much you eat of the following: ? Canned or prepackaged foods. ? Food that is high in trans fat, such as fried foods. ? Food that is high in saturated fat, such as fatty meat. ? Sweets, desserts, sugary drinks, and other foods with added sugar. ? Full-fat dairy products.  Do not salt foods before eating.  Try to eat at least 2 vegetarian meals each week.  Eat more home-cooked food and less restaurant, buffet, and fast food.  When eating at a restaurant, ask that your food be prepared with less salt or no salt, if possible. What foods are recommended? The items listed may not be a complete list. Talk with your dietitian about what dietary choices are best for you. Grains Whole-grain or whole-wheat bread. Whole-grain or whole-wheat pasta. Brown rice. [place]. Bulgur. Whole-grain and low-sodium cereals. Pita bread. Low-fat, low-sodium crackers. Whole-wheat flour tortillas. Vegetables Fresh or frozen vegetables (raw, steamed, roasted, or  grilled). Low-sodium or reduced-sodium tomato and vegetable juice. Low-sodium or reduced-sodium tomato sauce and tomato paste. Low-sodium or reduced-sodium canned vegetables. Fruits All fresh, dried, or frozen fruit. Canned fruit in natural juice (without added sugar). Meat and other protein foods Skinless chicken or [place]. Ground chicken or [place]. Pork with fat trimmed off. Fish and seafood. Egg whites. Dried beans, peas, or lentils. Unsalted nuts, nut butters, and seeds. Unsalted canned beans. Lean cuts of beef with fat trimmed off. Low-sodium, lean deli meat. Dairy Low-fat (1%) or fat-free (skim) milk. Fat-free, low-fat, or reduced-fat cheeses. Nonfat, low-sodium ricotta or cottage cheese. Low-fat or nonfat yogurt. Low-fat, low-sodium cheese.  Fats and oils Soft margarine without trans fats. Vegetable oil. Low-fat, reduced-fat, or light mayonnaise and salad dressings (reduced-sodium). Canola, safflower, olive, soybean, and sunflower oils. Avocado. Seasoning and other foods Herbs. Spices. Seasoning mixes without salt. Unsalted popcorn and pretzels. Fat-free sweets. What foods are not recommended? The items listed may not be a complete list. Talk with your dietitian about what dietary choices are best for you. Grains Baked goods made with fat, such as croissants, muffins, or some breads. Dry pasta or rice meal packs. Vegetables Creamed or fried vegetables. Vegetables in a cheese sauce. Regular canned vegetables (not low-sodium or reduced-sodium). Regular canned tomato sauce and paste (not low-sodium or reduced-sodium). Regular tomato and vegetable juice (not low-sodium or reduced-sodium). Angie Fava. Olives. Fruits Canned fruit in a light or heavy syrup. Fried fruit. Fruit in cream or butter sauce. Meat and other protein foods Fatty cuts of meat. Ribs. Fried meat. Berniece Salines. Sausage. Bologna and other processed lunch meats. Salami. Fatback. Hotdogs. Bratwurst. Salted nuts and seeds. Canned beans with  added salt. Canned or smoked fish. Whole eggs or egg yolks. Chicken or [place] with skin. Dairy Whole or 2% milk, cream, and half-and-half. Whole or full-fat cream cheese. Whole-fat or sweetened yogurt. Full-fat cheese. Nondairy creamers. Whipped toppings. Processed cheese and cheese spreads. Fats and oils Butter. Stick margarine. Lard. Shortening. Ghee. Bacon fat. Tropical oils, such as coconut, palm kernel, or palm oil. Seasoning and other foods Salted popcorn and pretzels. Onion salt, garlic salt, seasoned salt, table salt, and sea salt. Worcestershire sauce. Tartar sauce. Barbecue sauce. Teriyaki sauce. Soy sauce, including reduced-sodium. Steak sauce. Canned and packaged gravies. Fish sauce. Oyster sauce. Cocktail sauce. Horseradish that you find on the shelf. Ketchup. Mustard. Meat flavorings and tenderizers. Bouillon cubes. Hot sauce and Tabasco sauce. Premade or packaged marinades. Premade or packaged taco seasonings. Relishes. Regular salad dressings. Where to find more information:  National Heart, Lung, and [place]: https://wilson-eaton.com/  American Heart Association: www.heart.org Summary  The DASH eating plan is a healthy eating plan that has been shown to reduce high blood pressure (hypertension). It may also reduce your risk for type 2 diabetes, heart disease, and stroke.  With the DASH eating plan, you should limit salt (sodium) intake to 2,300 mg a day. If you have hypertension, you may need to reduce your sodium intake to 1,500 mg a day.  When on the DASH eating plan, aim to eat more fresh fruits and vegetables, whole grains, lean proteins, low-fat dairy, and heart-healthy fats.  Work with your health care provider or diet and nutrition specialist (dietitian) to adjust your eating plan to your individual calorie needs. This information is not intended to replace advice given to you by your health care provider. Make sure you discuss any questions you have with your health care  provider. Document Released: 07/12/2011 Document Revised: 07/16/2016 Document Reviewed: 07/16/2016 Elsevier Interactive Patient Education  2017 [employer].

## 2017-01-17 NOTE — Progress Notes (Signed)
Careteam: Patient Care Team: [name], NP as PCP - General (Nurse Practitioner) [name], MD as Consulting Physician (Ophthalmology)  Advanced Directive information Does Patient Have a Medical Advance Directive?: No, Would patient like information on creating a medical advance directive?: Yes (MAU/Ambulatory/Procedural Areas - Information given) (Pt has not completed packet)  No Known Allergies  Chief Complaint  Patient presents with  . Medical Management of Chronic Issues    Pt is being seen for a 6 month routine visit.      HPI: Patient is a 66 y.o. [name] seen in the office today for routine follow up. Pt iwht hx of DM, hyperlipidemia, GERD, OSA, HTN, vit d def.  He has been doing well since the last visit.   Hypertension- controlled on vasotec and low sodium diet  DM- A1c slightly worse on recent labs however remains at [place]. conts on metformin twice daily   Vit D def- on supplement  Hyperlipidemia- on lipitor. LDL at goal   OSA- hasd been wearing CPAP nightly but has not in several years. Wife monitors his sleep.   Report low energy and increase fatigue, which could be due to weight gain- has gained Raymond lbs in 6 months. Diet is very poor. No physical activity.   Review of Systems:  Review of Systems  Constitutional: Negative for chills, fever and weight loss.  HENT: Negative for tinnitus.   Respiratory: Negative for cough, sputum production and shortness of breath.   Cardiovascular: Negative for chest pain, palpitations and leg swelling.  Gastrointestinal: Negative for abdominal pain, constipation, diarrhea and heartburn.  Genitourinary: Negative for dysuria, frequency and urgency.  Musculoskeletal: Negative for back pain, falls, joint pain and myalgias.  Skin: Negative.   Neurological: Negative for dizziness and headaches.  Psychiatric/Behavioral: Negative for depression and memory loss. The patient does not have insomnia.    Past Medical  History:  Diagnosis Date  . Diabetes mellitus without complication ([place])   . Edema   . GERD (gastroesophageal reflux disease)   . Hyperlipidemia   . Hypertension   . Obesity, unspecified   . Problems with sexual function   . Sleep apnea    released per MD, no CPAP  . Unspecified sleep apnea   . Unspecified vitamin D deficiency    Past Surgical History:  Procedure Laterality Date  . CATARACT EXTRACTION  1964   Dr.Freed   . COLONOSCOPY    . HERNIA REPAIR  1987   Right Side, Dr.Kaufman    Social History:   reports that he has never smoked. He has never used smokeless tobacco. He reports that he does not drink alcohol or use drugs.  Family History  Problem Relation Age of Onset  . Colon cancer Mother   . Esophageal cancer Neg Hx   . Stomach cancer Neg Hx   . Rectal cancer Neg Hx     Medications: Patient's Medications  New Prescriptions   No medications on file  Previous Medications   ASPIRIN 81 MG TABLET    Take 81 mg by mouth daily.     ATORVASTATIN (LIPITOR) 40 MG TABLET    Take 1 tablet (40 mg total) by mouth at bedtime.   CHOLECALCIFEROL (VITAMIN D) 1000 UNITS TABLET    Take 1,000 Units by mouth daily. Take 1 tablet daily   ENALAPRIL (VASOTEC) 5 MG TABLET    TAKE 1 TABLET BY MOUTH EVERY DAY   GLUCOSE BLOOD TEST STRIP    1 each. Check blood sugar  2-3 x weekly   METFORMIN (GLUCOPHAGE) 500 MG TABLET    Take one tablet by mouth twice daily with a meal  Modified Medications   Modified Medication Previous Medication   ZOSTER VAC RECOMB ADJUVANTED (SHINGRIX) INJECTION Zoster Vac Recomb Adjuvanted (SHINGRIX) injection      Inject 0.5 mLs into the muscle once.    Inject 0.5 mLs into the muscle once.  Discontinued Medications   No medications on file     Physical Exam:  Vitals:   01/17/17 1025  BP: 124/74  Pulse: 77  Resp: 18  Temp: 97.4 F (36.3 C)  TempSrc: Oral  SpO2: 96%  Weight: 277 lb (125.6 kg)  Height: 6' (1.829 m)   Body mass index is 37.57  kg/m.  Physical Exam  Constitutional: He is oriented to person, place, and time. He appears well-developed and well-nourished. No distress.  HENT:  Head: Normocephalic and atraumatic.  Mouth/Throat: Oropharynx is clear and moist. No oropharyngeal exudate.  Eyes: Conjunctivae and EOM are normal. Pupils are equal, round, and reactive to light.  Neck: Normal range of motion. Neck supple.  Cardiovascular: Normal rate, regular rhythm and normal heart sounds.   Pulmonary/Chest: Effort normal and breath sounds normal.  Abdominal: Soft. Bowel sounds are normal.  Musculoskeletal: He exhibits no edema or tenderness.  Neurological: He is alert and oriented to person, place, and time.  Skin: Skin is warm and dry. He is not diaphoretic.  Psychiatric: He has a normal mood and affect.    Labs reviewed: Basic Metabolic Panel:  Recent Labs  04/06/16 0812 07/16/16 0830 01/16/17 0837  NA 135 137 136  K 4.5 4.5 4.8  CL 107 106 104  CO2 21 24 23   GLUCOSE 113* 126* 124*  BUN Raymond 20 17   CREATININE 1.00 0.97 1.01  CALCIUM 8.6 9.1 9.1   Liver Function Tests:  Recent Labs  04/06/16 0812 01/16/17 0837  AST 24 23  ALT 30 35  ALKPHOS 56 57  BILITOT 1.0 0.6  PROT 6.7 6.6  ALBUMIN 4.3 4.2   No results for input(s): LIPASE, AMYLASE in the last 8760 hours. No results for input(s): AMMONIA in the last 8760 hours. CBC:  Recent Labs  04/06/16 0812 01/16/17 0837  WBC 4.1 3.9  NEUTROABS 2,009 1,794  HGB 13.9 13.5  HCT 41.0 41.2  MCV 81.3 81.3  PLT 208 211   Lipid Panel:  Recent Labs  04/06/16 0812 01/16/17 0837  CHOL 106* 113  HDL 28* 25*  LDLCALC 50 59  TRIG 138 143  CHOLHDL 3.8 4.5   TSH: No results for input(s): TSH in the last 8760 hours. A1C: Lab Results  Component Value Date   HGBA1C 7.2 (H) 01/16/2017     Assessment/Plan 1. Essential hypertension -blood pressure stable, dash diet given. To cont enalapril.   2. Type 2 diabetes mellitus without complication,  unspecified whether long term insulin use (HCC) -a1c worse, cont metformin twice daily. Discussed need for lifestyle modifications.   3. Hyperlipidemia, unspecified hyperlipidemia type -stable, discussed lifestyle modification. To cont lipitor.   4. Vitamin D deficiency -conts on supplement.   5. Class 2 severe obesity due to excess calories with serious comorbidity and body mass index (BMI) of 37.0 to 37.9 in adult [facility]) - has gained Raymond lbs in the last 6 months, reports poor diet, eating a lot of sweets and unhealthy foods.  -discussed at length lifestyle modifications, changing diet and increasing physical activity. Pt also notes low energy and fatigue as  well as possible erectile dysfunction while walking out. Agreeable to change lifestyle and will follow up in 3 months to see if this improves these symptoms.   Follow up in 3 months due to weight gain  Leza Apsey K. Harle Battiest  [facility] [phone] 8 am - 5 pm) [phone] (after hours)

## 2017-01-25 ENCOUNTER — Other Ambulatory Visit: Payer: Self-pay | Admitting: *Deleted

## 2017-01-25 MED ORDER — GLUCOSE BLOOD VI STRP: ORAL_STRIP | 1 refills | Status: DC

## 2017-01-28 ENCOUNTER — Other Ambulatory Visit: Payer: Self-pay | Admitting: *Deleted

## 2017-01-28 MED ORDER — GLUCOSE BLOOD VI STRP: ORAL_STRIP | 5 refills | Status: DC

## 2017-01-28 NOTE — Telephone Encounter (Signed)
[facility] 

## 2017-02-21 ENCOUNTER — Other Ambulatory Visit: Payer: Self-pay | Admitting: Internal Medicine

## 2017-03-25 ENCOUNTER — Other Ambulatory Visit: Payer: Self-pay | Admitting: Internal Medicine

## 2017-04-16 ENCOUNTER — Ambulatory Visit: Payer: BLUE CROSS/BLUE SHIELD | Admitting: Nurse Practitioner

## 2017-05-09 ENCOUNTER — Ambulatory Visit: Payer: BLUE CROSS/BLUE SHIELD | Admitting: Nurse Practitioner

## 2017-05-22 ENCOUNTER — Other Ambulatory Visit: Payer: Self-pay | Admitting: Internal Medicine

## 2017-06-07 ENCOUNTER — Ambulatory Visit: Payer: Self-pay | Admitting: Nurse Practitioner

## 2017-06-15 ENCOUNTER — Other Ambulatory Visit: Payer: Self-pay | Admitting: Nurse Practitioner

## 2017-07-08 ENCOUNTER — Ambulatory Visit: Payer: Self-pay | Admitting: Nurse Practitioner

## 2017-07-11 ENCOUNTER — Other Ambulatory Visit: Payer: Self-pay | Admitting: *Deleted

## 2017-07-11 MED ORDER — GLUCOSE BLOOD VI STRP: ORAL_STRIP | 3 refills | Status: DC

## 2017-07-11 MED ORDER — ENALAPRIL MALEATE 5 MG PO TABS: ORAL_TABLET | ORAL | 3 refills | Status: DC

## 2017-07-11 NOTE — Telephone Encounter (Signed)
[facility] 

## 2017-07-17 ENCOUNTER — Telehealth: Payer: Self-pay | Admitting: *Deleted

## 2017-07-17 NOTE — Telephone Encounter (Signed)
Received CoverMyMeds Prior Authorization for FreeStyle Test Strips. Initiated through [facility] site. Went into determination.  GNP:HQNETU BCBS[place] YW:[id number]

## 2017-07-18 NOTE — Telephone Encounter (Signed)
Received fax from [facility] [phone] stating the request for coverage of FreeStyle test strips is DENIED. [employer] and Contour Next are the preferred test strips.   GY#:[id number] Ref#: [facility]

## 2017-08-21 ENCOUNTER — Ambulatory Visit: Payer: Self-pay | Admitting: Nurse Practitioner

## 2017-08-26 ENCOUNTER — Ambulatory Visit (INDEPENDENT_AMBULATORY_CARE_PROVIDER_SITE_OTHER): Payer: BLUE CROSS/BLUE SHIELD | Admitting: Nurse Practitioner

## 2017-08-26 ENCOUNTER — Encounter: Payer: Self-pay | Admitting: Nurse Practitioner

## 2017-08-26 VITALS — BP 118/84 | HR 70 | Temp 98.5°F | Ht 72.0 in | Wt 262.0 lb

## 2017-08-26 DIAGNOSIS — E119 Type 2 diabetes mellitus without complications: Secondary | ICD-10-CM

## 2017-08-26 DIAGNOSIS — Z23 Encounter for immunization: Secondary | ICD-10-CM | POA: Diagnosis not present

## 2017-08-26 DIAGNOSIS — I10 Essential (primary) hypertension: Secondary | ICD-10-CM

## 2017-08-26 DIAGNOSIS — E785 Hyperlipidemia, unspecified: Secondary | ICD-10-CM | POA: Diagnosis not present

## 2017-08-26 DIAGNOSIS — Z6837 Body mass index (BMI) 37.0-37.9, adult: Secondary | ICD-10-CM | POA: Diagnosis not present

## 2017-08-26 DIAGNOSIS — E559 Vitamin D deficiency, unspecified: Secondary | ICD-10-CM | POA: Diagnosis not present

## 2017-08-26 NOTE — Patient Instructions (Addendum)
continue to keep up the good work with diet and exercise.   Needs welcome to medicare visit sometime BEFORE annual visit which will be in June

## 2017-08-26 NOTE — Progress Notes (Signed)
Careteam: Patient Care Team: [name], NP as PCP - General (Nurse Practitioner) [name], MD as Consulting Physician (Ophthalmology) No Known Allergies  Chief Complaint  Patient presents with  . Medical Management of Chronic Issues    Pt is being seen for a routine visit. Pt has no concerns today.   . Immunizations    Pt would like to have flu vaccine today.   . Medication Refill    Pt does not need refills today.     HPI: Patient is a 67 y.o. male seen in the office today for routine follow up  Has made changes in diet and exercise.  Says his home scale said he was down to 244 lbs.  Doing well  Diabetes- checks blood sugars at home- 7 day avg 99, 14 day 107, 30 day 111. Cont to work on diet and exercise. Taking metformin 500 twice daily  HTN- controlled on enalapril- also on ACE due to diabetes  Hyperlipidemia- LDL 59 in June, conts on lipitor.  Obesity- attempting to lose weight with diet and exercise. Reports he is able to continue with these lifestyle changes.    Review of Systems:  Review of Systems  Constitutional: Negative for chills, fever and weight loss.  HENT: Negative for tinnitus.   Respiratory: Negative for cough, sputum production and shortness of breath.   Cardiovascular: Negative for chest pain, palpitations and leg swelling.  Gastrointestinal: Negative for abdominal pain, constipation, diarrhea and heartburn.  Genitourinary: Negative for dysuria, frequency and urgency.  Musculoskeletal: Negative for back pain, falls, joint pain and myalgias.  Skin: Negative.   Neurological: Negative for dizziness and headaches.  Psychiatric/Behavioral: Negative for depression and memory loss. The patient does not have insomnia.     Past Medical History:  Diagnosis Date  . Diabetes mellitus without complication ([place])   . Edema   . GERD (gastroesophageal reflux disease)   . Hyperlipidemia   . Hypertension   . Obesity, unspecified   . Problems  with sexual function   . Sleep apnea    released per MD, no CPAP  . Unspecified sleep apnea   . Unspecified vitamin D deficiency    Past Surgical History:  Procedure Laterality Date  . CATARACT EXTRACTION  1964   Dr.Freed   . COLONOSCOPY    . HERNIA REPAIR  1987   Right Side, Dr.Kaufman    Social History:   reports that  has never smoked. he has never used smokeless tobacco. He reports that he does not drink alcohol or use drugs.  Family History  Problem Relation Age of Onset  . Colon cancer Mother   . Esophageal cancer Neg Hx   . Stomach cancer Neg Hx   . Rectal cancer Neg Hx     Medications: Patient's Medications  New Prescriptions   No medications on file  Previous Medications   ASPIRIN 81 MG TABLET    Take 81 mg by mouth daily.     ATORVASTATIN (LIPITOR) 40 MG TABLET    TAKE 1 TABLET BY MOUTH AT BEDTIME   CHOLECALCIFEROL (VITAMIN D) 1000 UNITS TABLET    Take 1,000 Units by mouth daily. Take 1 tablet daily   ENALAPRIL (VASOTEC) 5 MG TABLET    Take one tablet by mouth once daily   GLUCOSE BLOOD TEST STRIP    Freestyle Lite Test Strip: Use to check blood sugar 2-3 x weekly Dx: E11.9   METFORMIN (GLUCOPHAGE) 500 MG TABLET    TAKE 1 TABLET  BY MOUTH TWICE A DAY WITH A MEAL  Modified Medications   No medications on file  Discontinued Medications   No medications on file     Physical Exam:  Vitals:   08/26/17 0902  BP: 118/84  Pulse: 70  Temp: 98.5 F (36.9 C)  TempSrc: Oral  SpO2: 96%  Weight: 262 lb (118.8 kg)  Height: 6' (1.829 m)   Body mass index is 35.53 kg/m.  Physical Exam  Constitutional: He is oriented to person, place, and time. He appears well-developed and well-nourished. No distress.  HENT:  Head: Normocephalic and atraumatic.  Mouth/Throat: Oropharynx is clear and moist. No oropharyngeal exudate.  Eyes: Conjunctivae and EOM are normal. Pupils are equal, round, and reactive to light.  Neck: Normal range of motion. Neck supple.    Cardiovascular: Normal rate, regular rhythm and normal heart sounds.  Pulmonary/Chest: Effort normal and breath sounds normal.  Abdominal: Soft. Bowel sounds are normal.  Musculoskeletal: He exhibits no edema or tenderness.  Neurological: He is alert and oriented to person, place, and time.  Skin: Skin is warm and dry. He is not diaphoretic.  Psychiatric: He has a normal mood and affect.    Labs reviewed: Basic Metabolic Panel: Recent Labs    01/16/17 0837  NA 136  K 4.8  CL 104  CO2 23  GLUCOSE 124*  BUN 17  CREATININE 1.01  CALCIUM 9.1   Liver Function Tests: Recent Labs    01/16/17 0837  AST 23  ALT 35  ALKPHOS 57  BILITOT 0.6  PROT 6.6  ALBUMIN 4.2   No results for input(s): LIPASE, AMYLASE in the last 8760 hours. No results for input(s): AMMONIA in the last 8760 hours. CBC: Recent Labs    01/16/17 0837  WBC 3.9  NEUTROABS 1,794  HGB 13.5  HCT 41.2  MCV 81.3  PLT 211   Lipid Panel: Recent Labs    01/16/17 0837  CHOL 113  HDL 25*  LDLCALC 59  TRIG 143  CHOLHDL 4.5   TSH: No results for input(s): TSH in the last 8760 hours. A1C: Lab Results  Component Value Date   HGBA1C 7.2 (H) 01/16/2017     Assessment/Plan 1. Essential hypertension -blood pressure controlled on current regimen - CBC with Differential/Platelets  2. Type 2 diabetes mellitus without complication, unspecified whether long term insulin use (HCC) -weekly blood sugar values trending down and improved, will cont current plan at this time - Hemoglobin A1c - CBC with Differential/Platelets; Future - Hemoglobin A1c; Future  3. Hyperlipidemia, unspecified hyperlipidemia type -continues on Lipitor with lifestyle modifications  - CMP - Lipid Panel; Future - COMPLETE METABOLIC PANEL WITH GFR; Future  4. Vitamin D deficiency -continues on supplement - Vitamin D, 25-hydroxy  5. Class 2 severe obesity due to excess calories with serious comorbidity and body mass index (BMI)  of 37.0 to 37.9 in adult [facility]) -lifestyle modifications and now with intentional weight loss. Continues to work on diet and exercise.   6. Need for immunization against influenza - Flu vaccine HIGH DOSE PF (Fluzone High dose)  Next appt: 01/23/2018 Carlos American. Harle Battiest  [facility] [phone] 8 am - 5 pm) [phone] (after hours)

## 2017-08-27 LAB — CBC WITH DIFFERENTIAL/PLATELET
BASOS ABS: 20 {cells}/uL (ref 0–200)
Basophils Relative: 0.6 %
EOS PCT: 3.6 %
Eosinophils Absolute: 119 cells/uL (ref 15–500)
HCT: 41.6 % (ref 38.5–50.0)
Hemoglobin: 14.2 g/dL (ref 13.2–17.1)
Lymphs Abs: 1247 cells/uL (ref 850–3900)
MCH: 27.2 pg (ref 27.0–33.0)
MCHC: 34.1 g/dL (ref 32.0–36.0)
MCV: 79.5 fL — ABNORMAL LOW (ref 80.0–100.0)
MONOS PCT: 11.1 %
MPV: 10.6 fL (ref 7.5–12.5)
NEUTROS ABS: 1548 {cells}/uL (ref 1500–7800)
Neutrophils Relative %: 46.9 %
PLATELETS: 240 10*3/uL (ref 140–400)
RBC: 5.23 10*6/uL (ref 4.20–5.80)
RDW: 14.1 % (ref 11.0–15.0)
Total Lymphocyte: 37.8 %
WBC mixed population: 366 cells/uL (ref 200–950)
WBC: 3.3 10*3/uL — AB (ref 3.8–10.8)

## 2017-08-27 LAB — COMPREHENSIVE METABOLIC PANEL
AG RATIO: 1.8 (calc) (ref 1.0–2.5)
ALKALINE PHOSPHATASE (APISO): 56 U/L (ref 40–115)
ALT: 41 U/L (ref 9–46)
AST: 28 U/L (ref 10–35)
Albumin: 4.6 g/dL (ref 3.6–5.1)
BILIRUBIN TOTAL: 0.8 mg/dL (ref 0.2–1.2)
BUN: 16 mg/dL (ref 7–25)
CALCIUM: 9.9 mg/dL (ref 8.6–10.3)
CO2: 23 mmol/L (ref 20–32)
Chloride: 105 mmol/L (ref 98–110)
Creat: 0.97 mg/dL (ref 0.70–1.25)
Globulin: 2.5 g/dL (calc) (ref 1.9–3.7)
Glucose, Bld: 110 mg/dL — ABNORMAL HIGH (ref 65–99)
POTASSIUM: 4.5 mmol/L (ref 3.5–5.3)
SODIUM: 136 mmol/L (ref 135–146)
TOTAL PROTEIN: 7.1 g/dL (ref 6.1–8.1)

## 2017-08-27 LAB — VITAMIN D 25 HYDROXY (VIT D DEFICIENCY, FRACTURES): VIT D 25 HYDROXY: 38 ng/mL (ref 30–100)

## 2017-08-27 LAB — HEMOGLOBIN A1C
EAG (MMOL/L): 8.9 (calc)
HEMOGLOBIN A1C: 7.2 %{Hb} — AB (ref <5.7)
MEAN PLASMA GLUCOSE: 160 (calc)

## 2017-09-06 DIAGNOSIS — M7730 Calcaneal spur, unspecified foot: Secondary | ICD-10-CM | POA: Insufficient documentation

## 2017-09-06 DIAGNOSIS — T148XXA Other injury of unspecified body region, initial encounter: Secondary | ICD-10-CM | POA: Insufficient documentation

## 2017-10-02 ENCOUNTER — Other Ambulatory Visit: Payer: Self-pay | Admitting: Nurse Practitioner

## 2017-12-06 ENCOUNTER — Other Ambulatory Visit: Payer: Self-pay | Admitting: Nurse Practitioner

## 2017-12-26 ENCOUNTER — Other Ambulatory Visit: Payer: Self-pay | Admitting: Nurse Practitioner

## 2018-01-22 ENCOUNTER — Other Ambulatory Visit: Payer: Self-pay

## 2018-01-22 DIAGNOSIS — E119 Type 2 diabetes mellitus without complications: Secondary | ICD-10-CM

## 2018-01-22 DIAGNOSIS — E785 Hyperlipidemia, unspecified: Secondary | ICD-10-CM

## 2018-01-23 ENCOUNTER — Other Ambulatory Visit: Payer: BLUE CROSS/BLUE SHIELD

## 2018-01-27 ENCOUNTER — Ambulatory Visit (INDEPENDENT_AMBULATORY_CARE_PROVIDER_SITE_OTHER): Payer: BLUE CROSS/BLUE SHIELD | Admitting: Nurse Practitioner

## 2018-01-27 ENCOUNTER — Encounter: Payer: Self-pay | Admitting: Nurse Practitioner

## 2018-01-27 VITALS — BP 122/78 | HR 64 | Temp 97.8°F | Ht 72.0 in | Wt 257.0 lb

## 2018-01-27 DIAGNOSIS — E785 Hyperlipidemia, unspecified: Secondary | ICD-10-CM | POA: Diagnosis not present

## 2018-01-27 DIAGNOSIS — Z23 Encounter for immunization: Secondary | ICD-10-CM

## 2018-01-27 DIAGNOSIS — I10 Essential (primary) hypertension: Secondary | ICD-10-CM | POA: Diagnosis not present

## 2018-01-27 DIAGNOSIS — M25511 Pain in right shoulder: Secondary | ICD-10-CM | POA: Diagnosis not present

## 2018-01-27 DIAGNOSIS — Z125 Encounter for screening for malignant neoplasm of prostate: Secondary | ICD-10-CM | POA: Diagnosis not present

## 2018-01-27 DIAGNOSIS — E669 Obesity, unspecified: Secondary | ICD-10-CM | POA: Diagnosis not present

## 2018-01-27 DIAGNOSIS — E119 Type 2 diabetes mellitus without complications: Secondary | ICD-10-CM | POA: Diagnosis not present

## 2018-01-27 DIAGNOSIS — Z0000 Encounter for general adult medical examination without abnormal findings: Secondary | ICD-10-CM | POA: Diagnosis not present

## 2018-01-27 NOTE — Progress Notes (Signed)
Provider: [name], NP  Patient Care Team: [name], NP as PCP - General (Nurse Practitioner) [name], MD as Consulting Physician (Ophthalmology)  Extended Emergency Contact Information Primary Emergency Contact: [name] Address: [place],  [place] Home Phone: [phone] Relation: None No Known Allergies Code Status: FULL Goals of Care: Advanced Directive information Advanced Directives 01/27/2018  Does Patient Have a Medical Advance Directive? No  Would patient like information on creating a medical advance directive? -     Chief Complaint  Patient presents with  . Medical Management of Chronic Issues    Pt is being seen for a physical.   . MMSE  . ACP    needed  . Depression    Score of 0    HPI: Patient is a 67 y.o. male seen in today for an annual wellness exam.   Major illnesses or hospitalization in the last year -none   Depression screen [facility] 2/9 01/27/2018 08/26/2017 07/19/2016 04/17/2016 01/20/2015  Decreased Interest 0 0 0 0 0  Down, Depressed, Hopeless 0 0 0 0 0  PHQ - 2 Score 0 0 0 0 0    Fall Risk  01/27/2018 08/26/2017 01/17/2017 07/19/2016 04/17/2016  Falls in the past year? No No No No No   No flowsheet data found.   Health Maintenance  Topic Date Due  . PNA vac Low Risk Adult (2 of 2 - PPSV23) 07/19/2017  . OPHTHALMOLOGY EXAM  01/14/2018  . INFLUENZA VACCINE  03/06/2018  . HEMOGLOBIN A1C  07/25/2018  . FOOT EXAM  08/26/2018  . COLONOSCOPY  02/10/2019  . TETANUS/TDAP  12/02/2023  . Hepatitis C Screening  Completed   Does not have any hcpoa or living will, discussed with wife yesterday about this.   Diet? Not strict on any diet.  Exercising? Not routinely   Dentition: has not been going to the dentist, went at the first of the year but it was "starting over"  Reports filling that needs to be replaced. Overall feels like dentition is well maintained.  Pain:none, reports recent  sore shoulder.   Past Medical History:  Diagnosis Date  . Diabetes mellitus without complication ([place])   . Edema   . GERD (gastroesophageal reflux disease)   . Hyperlipidemia   . Hypertension   . Obesity, unspecified   . Problems with sexual function   . Sleep apnea    released per MD, no CPAP  . Unspecified sleep apnea   . Unspecified vitamin D deficiency     Past Surgical History:  Procedure Laterality Date  . CATARACT EXTRACTION  1964   Dr.Freed   . COLONOSCOPY    . HERNIA REPAIR  1987   Right Side, Dr.Kaufman     Social History   Socioeconomic History  . Marital status: Married    Spouse name: Not on file  . Number of children: Not on file  . Years of education: Not on file  . Highest education level: Not on file  Occupational History  . Not on file  Social Needs  . Financial resource strain: Not on file  . Food insecurity:    Worry: Not on file    Inability: Not on file  . Transportation needs:    Medical: Not on file    Non-medical: Not on file  Tobacco Use  . Smoking status: Never Smoker  . Smokeless tobacco: Never Used  Substance and Sexual  Activity  . Alcohol use: No  . Drug use: No  . Sexual activity: Yes  Lifestyle  . Physical activity:    Days per week: Not on file    Minutes per session: Not on file  . Stress: Not on file  Relationships  . Social connections:    Talks on phone: Not on file    Gets together: Not on file    Attends religious service: Not on file    Active member of club or organization: Not on file    Attends meetings of clubs or organizations: Not on file    Relationship status: Not on file  Other Topics Concern  . Not on file  Social History Narrative  . Not on file    Family History  Problem Relation Age of Onset  . Colon cancer Mother   . Esophageal cancer Neg Hx   . Stomach cancer Neg Hx   . Rectal cancer Neg Hx     Review of Systems:  Review of Systems  Constitutional: Negative for activity change,  appetite change, fatigue and unexpected weight change.  HENT: Negative for congestion and hearing loss.   Respiratory: Negative for cough and shortness of breath.   Cardiovascular: Negative for chest pain, palpitations and leg swelling.  Gastrointestinal: Negative for abdominal pain, constipation and diarrhea.  Genitourinary: Negative for difficulty urinating, dysuria and frequency.  Musculoskeletal: Positive for arthralgias. Negative for myalgias.       Right shoulder pain for several months. Decrease ROM.  Skin: Negative for color change and wound.  Neurological: Negative for dizziness and weakness.  Psychiatric/Behavioral: Negative for agitation, behavioral problems and confusion.     Allergies as of 01/27/2018   No Known Allergies     Medication List        Accurate as of 01/27/18 11:15 AM. Always use your most recent med list.          aspirin 81 MG tablet Take 81 mg by mouth daily.   atorvastatin 40 MG tablet Commonly known as:  LIPITOR TAKE 1 TABLET BY MOUTH EVERYDAY AT BEDTIME   cholecalciferol 1000 units tablet Commonly known as:  VITAMIN D Take 1,000 Units by mouth daily. Take 1 tablet daily   enalapril 5 MG tablet Commonly known as:  VASOTEC Take one tablet by mouth once daily   glucose blood test strip Freestyle Lite Test Strip: Use to check blood sugar 2-3 x weekly Dx: E11.9   metFORMIN 500 MG tablet Commonly known as:  GLUCOPHAGE TAKE 1 TABLET BY MOUTH TWICE A DAY WITH A MEAL         Physical Exam: Vitals:   01/27/18 1107  BP: 122/78  Pulse: 64  Temp: 97.8 F (36.6 C)  TempSrc: Oral  SpO2: 97%  Weight: 257 lb (116.6 kg)  Height: 6' (1.829 m)   Body mass index is 34.86 kg/m. Physical Exam  Constitutional: He is oriented to person, place, and time. He appears well-developed and well-nourished. No distress.  HENT:  Head: Normocephalic and atraumatic.  Mouth/Throat: Oropharynx is clear and moist. No oropharyngeal exudate.  Eyes: Pupils  are equal, round, and reactive to light. Conjunctivae and EOM are normal.  Neck: Normal range of motion. Neck supple.  Cardiovascular: Normal rate, regular rhythm and normal heart sounds.  Pulmonary/Chest: Effort normal and breath sounds normal.  Abdominal: Soft. Bowel sounds are normal.  Musculoskeletal: He exhibits tenderness. He exhibits no edema.       Right shoulder: He exhibits decreased range  of motion and tenderness.  Neurological: He is alert and oriented to person, place, and time.  Skin: Skin is warm and dry. He is not diaphoretic.  Psychiatric: He has a normal mood and affect.    Labs reviewed: Basic Metabolic Panel: Recent Labs    08/26/17 1011 01/23/18 0810  NA 136 136  K 4.5 4.6  CL 105 103  CO2 23 24  GLUCOSE 110* 122*  BUN 16 17  CREATININE 0.97 1.10  CALCIUM 9.9 9.4   Liver Function Tests: Recent Labs    08/26/17 1011 01/23/18 0810  AST 28 20  ALT 41 26  BILITOT 0.8 0.7  PROT 7.1 6.7   No results for input(s): LIPASE, AMYLASE in the last 8760 hours. No results for input(s): AMMONIA in the last 8760 hours. CBC: Recent Labs    08/26/17 1011 01/23/18 0810  WBC 3.3* 4.0  NEUTROABS 1,548 1,812  HGB 14.2 13.8  HCT 41.6 40.6  MCV 79.5* 80.1  PLT 240 226   Lipid Panel: Recent Labs    01/23/18 0810  CHOL 121  HDL 28*  LDLCALC 71  TRIG 139  CHOLHDL 4.3   Lab Results  Component Value Date   HGBA1C 6.9 (H) 01/23/2018    Procedures: No results found.  Assessment/Plan 1. Preventative health care The patient was counseled regarding the appropriate use of alcohol, prevention of dental and periodontal disease, diet, regular sustained exercise for at least 30 minutes 5 times per week, testicular self-examination on a monthly basis,smoking cessation, tobacco use,  and recommended schedule for GI hemoccult testing, colonoscopy, cholesterol, thyroid and diabetes screening.  2. Acute pain of right shoulder -new pain to right shoulder with decrease  ROM. To use aleve 1 tablet twice daily for 1 week and will refer to therapy for further treatment.  - Ambulatory referral to Occupational Therapy  3. Type 2 diabetes mellitus without complication, unspecified whether long term insulin use (HCC) A1c at goal at 6.9, encouraged to continue to work on diet modifications and to continue metformin. Educated on routine diabetic eye exam, checking feet daily.  4. Hyperlipidemia, unspecified hyperlipidemia type LDL 71, continue on Lipitor with dietary modification.   5. Obesity (BMI 30.0-34.9) -noted, diet and exercise modifications encouraged.  6. Screening PSA (prostate specific antigen) -will check PSA   7. Need for pneumococcal vaccine - Pneumococcal polysaccharide vaccine 23-valent greater than or equal to 2yo subcutaneous/IM  8. Essential hypertension Stable, continues on vasotec 5 mg daily for HTN and diabetes.  - EKG 12-Lead   Next appt: 05/30/2018 Raymond [name]. [place], [facility] [phone]

## 2018-01-27 NOTE — Patient Instructions (Addendum)
To continue to work on diabetic diet  -dentist every 6 months recommended - regular sustained exercise for at least 30 minutes 5 times per week, -testicular self-examination on a monthly basis -important to sustain from tobacco use,   -recommended to keep follow ups for GI hemoccult testing, colonoscopy, cholesterol, thyroid and diabetes screening. -routine ophthalmology exams.  Referral placed for therapy for shoulder pain and decrease ROM Can use aleve twice daily for 1 week to help with pain as well.   Health Maintenance, Male A healthy lifestyle and preventive care is important for your health and wellness. Ask your health care provider about what schedule of regular examinations is right for you. What should I know about weight and diet? Eat a Healthy Diet  Eat plenty of vegetables, fruits, whole grains, low-fat dairy products, and lean protein.  Do not eat a lot of foods high in solid fats, added sugars, or salt.  Maintain a Healthy Weight Regular exercise can help you achieve or maintain a healthy weight. You should:  Do at least 150 minutes of exercise each week. The exercise should increase your heart rate and make you sweat (moderate-intensity exercise).  Do strength-training exercises at least twice a week.  Watch Your Levels of Cholesterol and Blood Lipids  Have your blood tested for lipids and cholesterol every 5 years starting at 66 years of age. If you are at high risk for heart disease, you should start having your blood tested when you are 66 years old. You may need to have your cholesterol levels checked more often if: ? Your lipid or cholesterol levels are high. ? You are older than 66 years of age. ? You are at high risk for heart disease.  What should I know about cancer screening? Many types of cancers can be detected early and may often be prevented. Lung Cancer  You should be screened every year for lung cancer if: ? You are a current smoker who has  smoked for at least 30 years. ? You are a former smoker who has quit within the past 15 years.  Talk to your health care provider about your screening options, when you should start screening, and how often you should be screened.  Colorectal Cancer  Routine colorectal cancer screening usually begins at 66 years of age and should be repeated every 5-10 years until you are 66 years old. You may need to be screened more often if early forms of precancerous polyps or small growths are found. Your health care provider may recommend screening at an earlier age if you have risk factors for colon cancer.  Your health care provider may recommend using home test kits to check for hidden blood in the stool.  A small camera at the end of a tube can be used to examine your colon (sigmoidoscopy or colonoscopy). This checks for the earliest forms of colorectal cancer.  Prostate and Testicular Cancer  Depending on your age and overall health, your health care provider may do certain tests to screen for prostate and testicular cancer.  Talk to your health care provider about any symptoms or concerns you have about testicular or prostate cancer.  Skin Cancer  Check your skin from head to toe regularly.  Tell your health care provider about any new moles or changes in moles, especially if: ? There is a change in a mole's size, shape, or color. ? You have a mole that is larger than a pencil eraser.  Always use sunscreen. Apply sunscreen  liberally and repeat throughout the day.  Protect yourself by wearing long sleeves, pants, a wide-brimmed hat, and sunglasses when outside.  What should I know about heart disease, diabetes, and high blood pressure?  If you are 36-31 years of age, have your blood pressure checked every 3-5 years. If you are 43 years of age or older, have your blood pressure checked every year. You should have your blood pressure measured twice-once when you are at a hospital or clinic,  and once when you are not at a hospital or clinic. Record the average of the two measurements. To check your blood pressure when you are not at a hospital or clinic, you can use: ? An automated blood pressure machine at a pharmacy. ? A home blood pressure monitor.  Talk to your health care provider about your target blood pressure.  If you are between 20-63 years old, ask your health care provider if you should take aspirin to prevent heart disease.  Have regular diabetes screenings by checking your fasting blood sugar level. ? If you are at a normal weight and have a low risk for diabetes, have this test once every three years after the age of 66. ? If you are overweight and have a high risk for diabetes, consider being tested at a younger age or more often.  A one-time screening for abdominal aortic aneurysm (AAA) by ultrasound is recommended for men aged 22-75 years who are current or former smokers. What should I know about preventing infection? Hepatitis B If you have a higher risk for hepatitis B, you should be screened for this virus. Talk with your health care provider to find out if you are at risk for hepatitis B infection. Hepatitis C Blood testing is recommended for:  Everyone born from [birth year] through 1964.  Anyone with known risk factors for hepatitis C.  Sexually Transmitted Diseases (STDs)  You should be screened each year for STDs including gonorrhea and chlamydia if: ? You are sexually active and are younger than 66 years of age. ? You are older than 66 years of age and your health care provider tells you that you are at risk for this type of infection. ? Your sexual activity has changed since you were last screened and you are at an increased risk for chlamydia or gonorrhea. Ask your health care provider if you are at risk.  Talk with your health care provider about whether you are at high risk of being infected with HIV. Your health care provider may recommend a  prescription medicine to help prevent HIV infection.  What else can I do?  Schedule regular health, dental, and eye exams.  Stay current with your vaccines (immunizations).  Do not use any tobacco products, such as cigarettes, chewing tobacco, and e-cigarettes. If you need help quitting, ask your health care provider.  Limit alcohol intake to no more than 2 drinks per day. One drink equals 12 ounces of beer, 5 ounces of wine, or 1 ounces of hard liquor.  Do not use street drugs.  Do not share needles.  Ask your health care provider for help if you need support or information about quitting drugs.  Tell your health care provider if you often feel depressed.  Tell your health care provider if you have ever been abused or do not feel safe at home. This information is not intended to replace advice given to you by your health care provider. Make sure you discuss any questions you have with your  health care provider. Document Released: 01/19/2008 Document Revised: 03/21/2016 Document Reviewed: 04/26/2015 Elsevier Interactive Patient Education  2018 [employer].   Fat and Cholesterol Restricted Diet Getting too much fat and cholesterol in your diet may cause health problems. Following this diet helps keep your fat and cholesterol at normal levels. This can keep you from getting sick. What types of fat should I choose?  Choose monosaturated and polyunsaturated fats. These are found in foods such as olive oil, canola oil, flaxseeds, walnuts, almonds, and seeds.  Eat more omega-3 fats. Good choices include salmon, mackerel, sardines, tuna, flaxseed oil, and ground flaxseeds.  Limit saturated fats. These are in animal products such as meats, butter, and cream. They can also be in plant products such as palm oil, palm kernel oil, and coconut oil.  Avoid foods with partially hydrogenated oils in them. These contain trans fats. Examples of foods that have trans fats are stick margarine,  some tub margarines, cookies, crackers, and other baked goods. What general guidelines do I need to follow?  Check food labels. Look for the words "trans fat" and "saturated fat."  When preparing a meal: ? Fill half of your plate with vegetables and green salads. ? Fill one fourth of your plate with whole grains. Look for the word "whole" as the first word in the ingredient list. ? Fill one fourth of your plate with lean protein foods.  Eat more foods that have fiber, like apples, carrots, beans, peas, and barley.  Eat more home-cooked foods. Eat less at restaurants and buffets.  Limit or avoid alcohol.  Limit foods high in starch and sugar.  Limit fried foods.  Cook foods without frying them. Baking, boiling, grilling, and broiling are all great options.  Lose weight if you are overweight. Losing even a small amount of weight can help your overall health. It can also help prevent diseases such as diabetes and heart disease. What foods can I eat? Grains Whole grains, such as whole wheat or whole grain breads, crackers, cereals, and pasta. Unsweetened oatmeal, bulgur, barley, quinoa, or brown rice. Corn or whole wheat flour tortillas. Vegetables Fresh or frozen vegetables (raw, steamed, roasted, or grilled). Green salads. Fruits All fresh, canned (in natural juice), or frozen fruits. Meat and Other Protein Products Ground beef (85% or leaner), grass-fed beef, or beef trimmed of fat. Skinless chicken or [place]. Ground chicken or [place]. Pork trimmed of fat. All fish and seafood. Eggs. Dried beans, peas, or lentils. Unsalted nuts or seeds. Unsalted canned or dry beans. Dairy Low-fat dairy products, such as skim or 1% milk, 2% or reduced-fat cheeses, low-fat ricotta or cottage cheese, or plain low-fat yogurt. Fats and Oils Tub margarines without trans fats. Light or reduced-fat mayonnaise and salad dressings. Avocado. Olive, canola, sesame, or safflower oils. Natural peanut or almond  butter (choose ones without added sugar and oil). The items listed above may not be a complete list of recommended foods or beverages. Contact your dietitian for more options. What foods are not recommended? Grains White bread. White pasta. White rice. Cornbread. Bagels, pastries, and croissants. Crackers that contain trans fat. Vegetables White potatoes. Corn. Creamed or fried vegetables. Vegetables in a cheese sauce. Fruits Dried fruits. Canned fruit in light or heavy syrup. Fruit juice. Meat and Other Protein Products Fatty cuts of meat. Ribs, chicken wings, bacon, sausage, bologna, salami, chitterlings, fatback, hot dogs, bratwurst, and packaged luncheon meats. Liver and organ meats. Dairy Whole or 2% milk, cream, half-and-half, and cream cheese. Whole milk  cheeses. Whole-fat or sweetened yogurt. Full-fat cheeses. Nondairy creamers and whipped toppings. Processed cheese, cheese spreads, or cheese curds. Sweets and Desserts Corn syrup, sugars, honey, and molasses. Candy. Jam and jelly. Syrup. Sweetened cereals. Cookies, pies, cakes, donuts, muffins, and ice cream. Fats and Oils Butter, stick margarine, lard, shortening, ghee, or bacon fat. Coconut, palm kernel, or palm oils. Beverages Alcohol. Sweetened drinks (such as sodas, lemonade, and fruit drinks or punches). The items listed above may not be a complete list of foods and beverages to avoid. Contact your dietitian for more information. This information is not intended to replace advice given to you by your health care provider. Make sure you discuss any questions you have with your health care provider. Document Released: 01/22/2012 Document Revised: 03/29/2016 Document Reviewed: 10/22/2013 Elsevier Interactive Patient Education  [employer].

## 2018-01-28 LAB — CBC WITH DIFFERENTIAL/PLATELET
BASOS PCT: 0.5 %
Basophils Absolute: 20 cells/uL (ref 0–200)
Eosinophils Absolute: 132 cells/uL (ref 15–500)
Eosinophils Relative: 3.3 %
HCT: 40.6 % (ref 38.5–50.0)
Hemoglobin: 13.8 g/dL (ref 13.2–17.1)
Lymphs Abs: 1488 cells/uL (ref 850–3900)
MCH: 27.2 pg (ref 27.0–33.0)
MCHC: 34 g/dL (ref 32.0–36.0)
MCV: 80.1 fL (ref 80.0–100.0)
MPV: 10.3 fL (ref 7.5–12.5)
Monocytes Relative: 13.7 %
Neutro Abs: 1812 cells/uL (ref 1500–7800)
Neutrophils Relative %: 45.3 %
PLATELETS: 226 10*3/uL (ref 140–400)
RBC: 5.07 10*6/uL (ref 4.20–5.80)
RDW: 13.7 % (ref 11.0–15.0)
TOTAL LYMPHOCYTE: 37.2 %
WBC: 4 10*3/uL (ref 3.8–10.8)
WBCMIX: 548 {cells}/uL (ref 200–950)

## 2018-01-28 LAB — COMPLETE METABOLIC PANEL WITH GFR
AG Ratio: 1.7 (calc) (ref 1.0–2.5)
ALT: 26 U/L (ref 9–46)
AST: 20 U/L (ref 10–35)
Albumin: 4.2 g/dL (ref 3.6–5.1)
Alkaline phosphatase (APISO): 65 U/L (ref 40–115)
BUN: 17 mg/dL (ref 7–25)
CALCIUM: 9.4 mg/dL (ref 8.6–10.3)
CO2: 24 mmol/L (ref 20–32)
CREATININE: 1.1 mg/dL (ref 0.70–1.25)
Chloride: 103 mmol/L (ref 98–110)
GFR, EST AFRICAN AMERICAN: 81 mL/min/{1.73_m2} (ref >=60)
GFR, EST NON AFRICAN AMERICAN: 70 mL/min/{1.73_m2} (ref >=60)
GLUCOSE: 122 mg/dL — AB (ref 65–99)
Globulin: 2.5 g/dL (calc) (ref 1.9–3.7)
Potassium: 4.6 mmol/L (ref 3.5–5.3)
Sodium: 136 mmol/L (ref 135–146)
TOTAL PROTEIN: 6.7 g/dL (ref 6.1–8.1)
Total Bilirubin: 0.7 mg/dL (ref 0.2–1.2)

## 2018-01-28 LAB — TEST AUTHORIZATION

## 2018-01-28 LAB — LIPID PANEL
Cholesterol: 121 mg/dL (ref <200)
HDL: 28 mg/dL — ABNORMAL LOW (ref >40)
LDL Cholesterol (Calc): 71 mg/dL (calc)
Non-HDL Cholesterol (Calc): 93 mg/dL (calc) (ref <130)
TRIGLYCERIDES: 139 mg/dL (ref <150)
Total CHOL/HDL Ratio: 4.3 (calc) (ref <5.0)

## 2018-01-28 LAB — HEMOGLOBIN A1C
HEMOGLOBIN A1C: 6.9 %{Hb} — AB (ref <5.7)
MEAN PLASMA GLUCOSE: 151 (calc)
eAG (mmol/L): 8.4 (calc)

## 2018-01-28 LAB — PSA: PSA: 2.2 ng/mL (ref <=4.0)

## 2018-02-05 ENCOUNTER — Other Ambulatory Visit: Payer: Self-pay | Admitting: Nurse Practitioner

## 2018-02-05 DIAGNOSIS — M25511 Pain in right shoulder: Secondary | ICD-10-CM

## 2018-02-07 ENCOUNTER — Other Ambulatory Visit: Payer: Self-pay

## 2018-02-07 DIAGNOSIS — M25511 Pain in right shoulder: Secondary | ICD-10-CM

## 2018-02-13 ENCOUNTER — Other Ambulatory Visit: Payer: Self-pay

## 2018-02-13 ENCOUNTER — Ambulatory Visit: Payer: BLUE CROSS/BLUE SHIELD | Attending: Nurse Practitioner | Admitting: Physical Therapy

## 2018-02-13 DIAGNOSIS — M25611 Stiffness of right shoulder, not elsewhere classified: Secondary | ICD-10-CM | POA: Insufficient documentation

## 2018-02-13 DIAGNOSIS — M6281 Muscle weakness (generalized): Secondary | ICD-10-CM | POA: Insufficient documentation

## 2018-02-13 DIAGNOSIS — G8929 Other chronic pain: Secondary | ICD-10-CM | POA: Diagnosis present

## 2018-02-13 DIAGNOSIS — M25511 Pain in right shoulder: Secondary | ICD-10-CM | POA: Diagnosis present

## 2018-02-14 ENCOUNTER — Encounter: Payer: Self-pay | Admitting: Physical Therapy

## 2018-02-14 NOTE — Therapy (Signed)
[place], [place], [address] Phone: [phone]   Fax:  [phone]  Physical Therapy Evaluation  Patient Details  Name: Raymond [name] MRN: [id number] Date of Birth: [birth year] Referring Provider: Sherrie Mustache    Encounter Date: 02/13/2018  PT End of Session - 02/14/18 0752    Visit Number  1    Number of Visits  12    Date for PT Re-Evaluation  03/28/18    Authorization Type  [employer]     PT Start Time  2778 Patient was 8 minutes late     PT Stop Time  1500    PT Time Calculation (min)  37 min    Activity Tolerance  Patient tolerated treatment well    Behavior During Therapy  [facility] for tasks assessed/performed       Past Medical History:  Diagnosis Date  . Diabetes mellitus without complication ([place])   . Edema   . GERD (gastroesophageal reflux disease)   . Hyperlipidemia   . Hypertension   . Obesity, unspecified   . Problems with sexual function   . Sleep apnea    released per MD, no CPAP  . Unspecified sleep apnea   . Unspecified vitamin D deficiency     Past Surgical History:  Procedure Laterality Date  . CATARACT EXTRACTION  1964   Dr.Freed   . COLONOSCOPY    . HERNIA REPAIR  1987   Right Side, Dr.Kaufman     There were no vitals filed for this visit.   Subjective Assessment - 02/13/18 1428    Subjective  Patient had an incidious onset of right shoulder pain about 3 months ago. He has most of his pain when he abducts his arm or lifts his shoulder over his head. He has to do lifting at work.  His pain radiates from his lateral shoulder into his mid deltoid area. He used to sleep on his right side but now he sleeps on his left side. The pain does not wake him up at night.     Limitations  House hold activities;Lifting    Diagnostic tests  Nothing in the chart. Nothing per patient     Patient Stated Goals  to be ableto reach his shoulder overhead     Currently in Pain?  Yes    Pain  Score  5  5    Pain Location  Shoulder    Pain Orientation  Right    Pain Descriptors / Indicators  Aching    Pain Type  Chronic pain    Pain Onset  More than a month ago    Pain Frequency  Constant    Aggravating Factors   raising his arm overhead    Pain Relieving Factors  as the day goes on it gets better.     Effect of Pain on Daily Activities  difficulty reaching overhead     Multiple Pain Sites  No         [facility] PT Assessment - 02/14/18 0001      Assessment   Medical Diagnosis  Right Shoulder Pain     Referring Provider  Sherrie Mustache     Onset Date/Surgical Date  -- 3 months prior     Hand Dominance  Right    Next MD Visit  October     Prior Therapy  None       Precautions   Precautions  None      Restrictions  Weight Bearing Restrictions  No      Balance Screen   Has the patient fallen in the past 6 months  No    Has the patient had a decrease in activity level because of a fear of falling?   No    Is the patient reluctant to leave their home because of a fear of falling?   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     Leisure  had been going to the gym       Cognition   Overall Cognitive Status  Within Functional Limits for tasks assessed    Attention  Focused    Focused Attention  Appears intact    Memory  Appears intact    Awareness  Appears intact    Problem Solving  Appears intact      Observation/Other Assessments   Focus on Therapeutic Outcomes (FOTO)   44% limitation       Sensation   Light Touch  Appears Intact      Coordination   Gross Motor Movements are Fluid and Coordinated  Yes    Fine Motor Movements are Fluid and Coordinated  Yes      Posture/Postural Control   Posture Comments  signficant rounding of bilateral shoulders      AROM    Right Shoulder Flexion  103 Degrees    Right Shoulder Internal Rotation  -- to mid gluteal     Right Shoulder External Rotation  -- can reach the back of his head but painful     Left Shoulder Internal Rotation  -- to L3       PROM   Right/Left Shoulder  Right    Right Shoulder Flexion  120 Degrees pain at end range     Right Shoulder Internal Rotation  75 Degrees    Right Shoulder External Rotation  55 Degrees      Strength   Right/Left Shoulder  Right    Right Shoulder Flexion  4/5    Right Shoulder Internal Rotation  4+/5    Right Shoulder External Rotation  4+/5      Palpation   Palpation comment  tender to palpation in the ac joint and the anterior shoulder       Special Tests    Special Tests  Biceps/Labral Tests;Rotator Cuff Impingement    Rotator Cuff Impingment tests  Michel Bickers test    Biceps/Labral tests  Speeds Test      Speeds test   Comment  (+) right                 Objective measurements completed on examination: See above findings.              PT Education - 02/14/18 0750    Education Details  reviewed HEP; symptom management; anatomy of the condition     Person(s) Educated  Patient    Methods  Explanation;Demonstration;Verbal cues;Tactile cues    Comprehension  Verbalized understanding;Returned demonstration;Verbal cues required;Tactile cues required       PT Short Term Goals - 02/14/18 0810      PT SHORT TERM GOAL #1   Title  Patient will demsotrate full passive right shouldr  flexion and ER     Time  3    Period  Weeks    Status  New    Target Date  03/07/18      PT SHORT TERM GOAL #2   Title  Patient will demonstrate 4+/5 gross right shoulder strength     Time  3    Period  Weeks    Status  New    Target Date  03/07/18      PT SHORT TERM GOAL #3   Title  Patient will be independent with inital HEP     Time  3    Period  Weeks    Status  New    Target Date  03/07/18        PT Long Term Goals - 02/14/18  0812      PT LONG TERM GOAL #1   Title  Patient will reach behind his head in order to perfrom hygeine tasks     Time  8    Period  Weeks    Status  New    Target Date  04/11/18      PT LONG TERM GOAL #2   Title  Patient will reach behind back to L4-L5 with right arm without pain in order     Time  6    Period  Weeks    Status  New    Target Date  03/28/18      PT LONG TERM GOAL #3   Title  Patient will reach overhead to a shelf with a 2lb weight without pain in order to perfrom IADL's     Time  6    Period  Weeks    Status  New    Target Date  03/28/18             Plan - 02/14/18 0753    Clinical Impression Statement  Patient is a 67 year old male with right shoulder pain. He has increased pain when she reaches over head. Sings and symptoms are consitent with RTC impingement as well as bicpes tendinitis. He has limited ative and passive range with flexion and ER. He has mild weakness in available ranges. He has a Research officer, political party and speeds test. He has a negative drop arm test. He has significanlty rounded shoulders. He would benefit from skilled therapy to reduce shoulder inflamation, to improve motion, and to increase fucntional use of his shoulder. he was seen for a low complexity eval.     Clinical Presentation  Evolving    Clinical Decision Making  Moderate    Rehab Potential  Good    PT Frequency  2x / week    PT Duration  6 weeks    PT Treatment/Interventions  ADLs/Self Care Home Management;Cryotherapy;Electrical Stimulation;Iontophoresis 4mg /ml Dexamethasone;Therapeutic activities;Therapeutic exercise;Neuromuscular re-education;Patient/family education;Manual techniques;Passive range of motion;Dry needling;Taping    PT Next Visit Plan  begin manual therapy and joint mobilizations; add light RTC strengthenoing; consider ionto for bicpes tendinitis, consider [place] to right UE if needed. Add pulles; add serratus punch    PT Home Exercise Plan  wand flexion; scap  retraction; shoulder extension     Consulted and Agree with Plan of Care  Patient       Patient will benefit from skilled therapeutic intervention in order to improve the following deficits and impairments:  Pain, Postural dysfunction, Impaired UE functional use, Decreased activity tolerance, Decreased endurance, Decreased range of motion, Decreased strength  Visit Diagnosis: Chronic right shoulder pain  Stiffness of right shoulder, not elsewhere classified  Muscle weakness (generalized)     Problem List Patient Active Problem List   Diagnosis Date Noted  . Musculoskeletal leg pain 07/15/2013  . DM type 2 (diabetes mellitus, type 2) ([place]) 04/01/2013  . Problems with sexual function 03/07/2010  . General medical examination 03/07/2010  . Hyperlipemia 03/16/2009  . OBSTRUCTIVE SLEEP APNEA 03/16/2009  . Essential hypertension 03/15/2009  . Vitamin D deficiency 03/04/2009  . Edema 02/14/2009  . Obesity, unspecified 03/12/2008  . GERD (gastroesophageal reflux disease) 10/22/2007  . Pain in the chest 10/22/2007  . Unspecified sleep apnea 07/03/2005    Carney Living  PT DPT  02/14/2018, 11:53 AM  [facility] [address] [place], [place], [address] Phone: [phone]   Fax:  [phone]  Name: NAI DASCH MRN: [id number] Date of Birth: 1950/10/17

## 2018-02-20 ENCOUNTER — Ambulatory Visit: Payer: BLUE CROSS/BLUE SHIELD | Admitting: Physical Therapy

## 2018-02-27 ENCOUNTER — Encounter: Payer: Self-pay | Admitting: Physical Therapy

## 2018-02-27 ENCOUNTER — Ambulatory Visit: Payer: BLUE CROSS/BLUE SHIELD | Admitting: Physical Therapy

## 2018-02-27 DIAGNOSIS — M6281 Muscle weakness (generalized): Secondary | ICD-10-CM

## 2018-02-27 DIAGNOSIS — M25511 Pain in right shoulder: Principal | ICD-10-CM

## 2018-02-27 DIAGNOSIS — G8929 Other chronic pain: Secondary | ICD-10-CM

## 2018-02-27 DIAGNOSIS — M25611 Stiffness of right shoulder, not elsewhere classified: Secondary | ICD-10-CM

## 2018-02-27 NOTE — Therapy (Signed)
[place], [place], [address] Phone: [phone]   Fax:  [phone]  Physical Therapy Treatment  Patient Details  Name: Raymond [name] MRN: [id number] Date of Birth: [birth year] Referring Provider: Sherrie Mustache    Encounter Date: 02/27/2018  PT End of Session - 02/27/18 0910    Visit Number  2    Number of Visits  12    Date for PT Re-Evaluation  03/28/18    Authorization Type  BCBS     PT Start Time  0800    PT Stop Time  0842    PT Time Calculation (min)  42 min       Past Medical History:  Diagnosis Date  . Diabetes mellitus without complication ([place])   . Edema   . GERD (gastroesophageal reflux disease)   . Hyperlipidemia   . Hypertension   . Obesity, unspecified   . Problems with sexual function   . Sleep apnea    released per MD, no CPAP  . Unspecified sleep apnea   . Unspecified vitamin D deficiency     Past Surgical History:  Procedure Laterality Date  . CATARACT EXTRACTION  1964   Dr.Freed   . COLONOSCOPY    . HERNIA REPAIR  1987   Right Side, Dr.Kaufman     There were no vitals filed for this visit.  Subjective Assessment - 02/27/18 0802    Subjective  Doing pretty ggod. Did the exercises once a day. Missed last week. Shoulder still hurts to lift to side with elbow bent.     Currently in Pain?  Yes    Pain Score  5     Pain Orientation  Right    Pain Descriptors / Indicators  -- depends    Aggravating Factors   raising arm, certain movements , laying on it     Pain Relieving Factors  avoiding those motions          [facility] PT Assessment - 02/27/18 0001      PROM   Right Shoulder Flexion  125 Degrees    Right Shoulder External Rotation  30 Degrees                   [facility] Adult PT Treatment/Exercise - 02/27/18 0001      Shoulder Exercises: Supine   Protraction  20 reps    Other Supine Exercises  supine cane  pressups, pullovers, ER (pain with ER)       Shoulder Exercises: Standing   Extension Limitations  2 x 10 green    Row Limitations  2 x 10 green      Shoulder Exercises: Pulleys   Flexion  2 minutes      Shoulder Exercises: Isometric Strengthening   Flexion Limitations  5 sec x 10    External Rotation Limitations  5 sec x 10    Internal Rotation Limitations  5 sec x 10      Manual Therapy   Manual Therapy  Joint mobilization    Joint Mobilization  A/P and inferior mobs     Passive ROM  Flexion, abduction, IR, ER              PT Education - 02/27/18 0843    Education Details  HEP    Person(s) Educated  Patient    Methods  Explanation;Handout    Comprehension  Verbalized understanding       PT Short Term Goals - 02/14/18 [id number]  PT SHORT TERM GOAL #1   Title  Patient will demsotrate full passive right shouldr flexion and ER     Time  3    Period  Weeks    Status  New    Target Date  03/07/18      PT SHORT TERM GOAL #2   Title  Patient will demonstrate 4+/5 gross right shoulder strength     Time  3    Period  Weeks    Status  New    Target Date  03/07/18      PT SHORT TERM GOAL #3   Title  Patient will be independent with inital HEP     Time  3    Period  Weeks    Status  New    Target Date  03/07/18        PT Long Term Goals - 02/14/18 0812      PT LONG TERM GOAL #1   Title  Patient will reach behind his head in order to perfrom hygeine tasks     Time  8    Period  Weeks    Status  New    Target Date  04/11/18      PT LONG TERM GOAL #2   Title  Patient will reach behind back to L4-L5 with right arm without pain in order     Time  6    Period  Weeks    Status  New    Target Date  03/28/18      PT LONG TERM GOAL #3   Title  Patient will reach overhead to a shelf with a 2lb weight without pain in order to perfrom IADL's     Time  6    Period  Weeks    Status  New    Target Date  03/28/18            Plan - 02/27/18 0830    Clinical Impression Statement  More pain with ER  today and less PROM since initial eval. He reports jamming something yesterday that caused increased pain. Reviewed HEP. Added supine cane ER with cues to keep in painfree range. Added isometrics also with cues for painfree strengthening. Updated HEP and cautioned pt  not perform HEP again today if he is sore. He has another appointment tomorrow. He declined ice and felt loosened up after treatment.     PT Next Visit Plan  begin manual therapy and joint mobilizations; add light RTC strengthenoing; consider ionto for bicpes tendinitis, consider [place] to right UE if needed. Add pulles; add serratus punch    PT Home Exercise Plan  wand flexion; scap retraction; shoulder extension , supine cane ER, supine protraction, isometric flexion, ER, IR     Consulted and Agree with Plan of Care  Patient       Patient will benefit from skilled therapeutic intervention in order to improve the following deficits and impairments:  Pain, Postural dysfunction, Impaired UE functional use, Decreased activity tolerance, Decreased endurance, Decreased range of motion, Decreased strength  Visit Diagnosis: Chronic right shoulder pain  Stiffness of right shoulder, not elsewhere classified  Muscle weakness (generalized)     Problem List Patient Active Problem List   Diagnosis Date Noted  . Musculoskeletal leg pain 07/15/2013  . DM type 2 (diabetes mellitus, type 2) ([place]) 04/01/2013  . Problems with sexual function 03/07/2010  . General medical examination 03/07/2010  . Hyperlipemia 03/16/2009  . OBSTRUCTIVE SLEEP APNEA 03/16/2009  .  Essential hypertension 03/15/2009  . Vitamin D deficiency 03/04/2009  . Edema 02/14/2009  . Obesity, unspecified 03/12/2008  . GERD (gastroesophageal reflux disease) 10/22/2007  . Pain in the chest 10/22/2007  . Unspecified sleep apnea 07/03/2005    Dorene Ar, PTA 02/27/2018, 9:10 AM  [facility] [address] [place], [place], [address] Phone: [phone]   Fax:  [phone]  Name: OBI SCRIMA MRN: [id number] Date of Birth: [birth year]

## 2018-02-27 NOTE — Patient Instructions (Addendum)
SHOULDER: External Rotation - Supine (Cane)   Hold cane with both hands. Rotate arm away from body. Keep elbow on floor and next to body. _10__ reps per set, _2__ sets per day, __7_ days per week Add towel to keep elbow at side.   Strengthening: Isometric Flexion  Using wall for resistance, press right fist into ball using light pressure. Hold __5__ seconds. Repeat __10__ times per set. Do _1___ sets per session. Do 2____ sessions per day.    Internal Rotation (Isometric)  Place palm of right fist against door frame, with elbow bent. Press fist against door frame. Hold ___5_ seconds. Repeat __10__ times. Do ___2_ sessions per day.  External Rotation (Isometric)  Place back of left fist against door frame, with elbow bent. Press fist against door frame. Hold ____5 seconds. Repeat ___10_ times. Do _2___ sessions per day.  Copyright  VHI. All rights reserved.

## 2018-02-28 ENCOUNTER — Ambulatory Visit: Payer: BLUE CROSS/BLUE SHIELD | Admitting: Physical Therapy

## 2018-02-28 ENCOUNTER — Encounter: Payer: Self-pay | Admitting: Physical Therapy

## 2018-02-28 DIAGNOSIS — M25511 Pain in right shoulder: Secondary | ICD-10-CM | POA: Diagnosis not present

## 2018-02-28 DIAGNOSIS — M6281 Muscle weakness (generalized): Secondary | ICD-10-CM

## 2018-02-28 DIAGNOSIS — M25611 Stiffness of right shoulder, not elsewhere classified: Secondary | ICD-10-CM

## 2018-02-28 DIAGNOSIS — G8929 Other chronic pain: Secondary | ICD-10-CM

## 2018-02-28 NOTE — Therapy (Signed)
[place], [place], [address] Phone: [phone]   Fax:  [phone]  Physical Therapy Treatment  Patient Details  Name: Raymond [name] MRN: [id number] Date of Birth: 1950-11-13 Referring Provider: Sherrie Mustache    Encounter Date: 02/28/2018  PT End of Session - 02/28/18 0950    Visit Number  3    Number of Visits  12    Date for PT Re-Evaluation  03/28/18    Authorization Type  BCBS     PT Start Time  [phone] Patient 17 minutes late     PT Stop Time  1030    PT Time Calculation (min)  43 min    Activity Tolerance  Patient tolerated treatment well    Behavior During Therapy  [facility] for tasks assessed/performed       Past Medical History:  Diagnosis Date  . Diabetes mellitus without complication ([place])   . Edema   . GERD (gastroesophageal reflux disease)   . Hyperlipidemia   . Hypertension   . Obesity, unspecified   . Problems with sexual function   . Sleep apnea    released per MD, no CPAP  . Unspecified sleep apnea   . Unspecified vitamin D deficiency     Past Surgical History:  Procedure Laterality Date  . CATARACT EXTRACTION  1964   Dr.Freed   . COLONOSCOPY    . HERNIA REPAIR  1987   Right Side, Dr.Kaufman     There were no vitals filed for this visit.  Subjective Assessment - 02/28/18 0948    Subjective  Patient report his pain level is about a 4-5/10. He was a little sore after yesterday but not too bed. He did his exercises lasat night.     Limitations  House hold activities;Lifting    Diagnostic tests  Nothing in the chart. Nothing per patient     Patient Stated Goals  to be ableto reach his shoulder overhead     Currently in Pain?  Yes    Pain Score  5     Pain Location  Shoulder    Pain Orientation  Right;Anterior    Pain Descriptors / Indicators  Aching    Pain Type  Chronic pain    Pain Onset  More than a month ago    Pain Frequency  Constant    Aggravating Factors   raisng  arm     Pain Relieving Factors  avoiding certain motions     Effect of Pain on Daily Activities  difficulty reaching overhead                        [facility] Adult PT Treatment/Exercise - 02/28/18 0001      Shoulder Exercises: Supine   Protraction  20 reps    Other Supine Exercises  supine cane  pressups, pullovers, ER (pain with ER)      Shoulder Exercises: [name]   Internal Rotation  Limitations    Internal Rotation Limitations  red 2x10     Extension Limitations  2 x 10 green    Row Limitations  2 x 10 green      Shoulder Exercises: Pulleys   Flexion  2 minutes      Manual Therapy   Joint Mobilization  A/P and inferior mobs     Passive ROM  Flexion, abduction, IR, ER              PT Education -  02/28/18 0950    Education Details  reviewed technique     Person(s) Educated  Patient    Methods  Explanation;Demonstration;Tactile cues;Verbal cues    Comprehension  Verbalized understanding;Returned demonstration;Verbal cues required;Tactile cues required       PT Short Term Goals - 02/14/18 0810      PT SHORT TERM GOAL #1   Title  Patient will demsotrate full passive right shouldr flexion and ER     Time  3    Period  Weeks    Status  New    Target Date  03/07/18      PT SHORT TERM GOAL #2   Title  Patient will demonstrate 4+/5 gross right shoulder strength     Time  3    Period  Weeks    Status  New    Target Date  03/07/18      PT SHORT TERM GOAL #3   Title  Patient will be independent with inital HEP     Time  3    Period  Weeks    Status  New    Target Date  03/07/18        PT Long Term Goals - 02/14/18 0812      PT LONG TERM GOAL #1   Title  Patient will reach behind his head in order to perfrom hygeine tasks     Time  8    Period  Weeks    Status  New    Target Date  04/11/18      PT LONG TERM GOAL #2   Title  Patient will reach behind back to L4-L5 with right arm without pain in order     Time  6    Period  Weeks     Status  New    Target Date  03/28/18      PT LONG TERM GOAL #3   Title  Patient will reach overhead to a shelf with a 2lb weight without pain in order to perfrom IADL's     Time  6    Period  Weeks    Status  New    Target Date  03/28/18            Plan - 02/28/18 0951    Clinical Impression Statement  Pt's range iumproved with stretching. Patient advanced to light active RTC strengthening. He reported no significnat increase in pain. His HEP was updated,     Clinical Presentation  Evolving    Clinical Decision Making  Moderate    Rehab Potential  Good       Patient will benefit from skilled therapeutic intervention in order to improve the following deficits and impairments:  Pain, Postural dysfunction, Impaired UE functional use, Decreased activity tolerance, Decreased endurance, Decreased range of motion, Decreased strength  Visit Diagnosis: Chronic right shoulder pain  Stiffness of right shoulder, not elsewhere classified  Muscle weakness (generalized)     Problem List Patient Active Problem List   Diagnosis Date Noted  . Musculoskeletal leg pain 07/15/2013  . DM type 2 (diabetes mellitus, type 2) ([place]) 04/01/2013  . Problems with sexual function 03/07/2010  . General medical examination 03/07/2010  . Hyperlipemia 03/16/2009  . OBSTRUCTIVE SLEEP APNEA 03/16/2009  . Essential hypertension 03/15/2009  . Vitamin D deficiency 03/04/2009  . Edema 02/14/2009  . Obesity, unspecified 03/12/2008  . GERD (gastroesophageal reflux disease) 10/22/2007  . Pain in the chest 10/22/2007  . Unspecified sleep apnea 07/03/2005  Carney Living PT DPT  02/28/2018, 11:46 AM  [facility] [address] [place], [place], [address] Phone: [phone]   Fax:  [phone]  Name: Raymond [name] MRN: [id number] Date of Birth: May 25, 1951

## 2018-03-06 ENCOUNTER — Ambulatory Visit: Payer: BLUE CROSS/BLUE SHIELD | Attending: Nurse Practitioner | Admitting: Physical Therapy

## 2018-03-06 DIAGNOSIS — M25611 Stiffness of right shoulder, not elsewhere classified: Secondary | ICD-10-CM | POA: Diagnosis present

## 2018-03-06 DIAGNOSIS — M6281 Muscle weakness (generalized): Secondary | ICD-10-CM | POA: Diagnosis present

## 2018-03-06 DIAGNOSIS — G8929 Other chronic pain: Secondary | ICD-10-CM

## 2018-03-06 DIAGNOSIS — M25511 Pain in right shoulder: Secondary | ICD-10-CM | POA: Diagnosis present

## 2018-03-07 ENCOUNTER — Encounter: Payer: Self-pay | Admitting: Physical Therapy

## 2018-03-07 NOTE — Therapy (Signed)
[place], [place], [address] Phone: [phone]   Fax:  [phone]  Physical Therapy Treatment  Patient Details  Name: Raymond [name] MRN: [id number] Date of Birth: Dec 19, 1950 Referring Provider: Sherrie Mustache    Encounter Date: 03/06/2018  PT End of Session - 03/06/18 1432    Visit Number  4    Number of Visits  12    Date for PT Re-Evaluation  03/28/18    Authorization Type  BCBS     PT Start Time  0230 Patient was 15 minutes late     PT Stop Time  0305    PT Time Calculation (min)  35 min    Activity Tolerance  Patient tolerated treatment well    Behavior During Therapy  [facility] for tasks assessed/performed       Past Medical History:  Diagnosis Date  . Diabetes mellitus without complication ([place])   . Edema   . GERD (gastroesophageal reflux disease)   . Hyperlipidemia   . Hypertension   . Obesity, unspecified   . Problems with sexual function   . Sleep apnea    released per MD, no CPAP  . Unspecified sleep apnea   . Unspecified vitamin D deficiency     Past Surgical History:  Procedure Laterality Date  . CATARACT EXTRACTION  1964   Dr.Freed   . COLONOSCOPY    . HERNIA REPAIR  1987   Right Side, Dr.Kaufman     There were no vitals filed for this visit.  Subjective Assessment - 03/07/18 0930    Subjective  Patient was 15 minutes later for his appointment. He reports at times his shoulder feels better but he is still having pain. He does report he has not been able to be consitent with his exercises because of work.     Limitations  House hold activities;Lifting    Diagnostic tests  Nothing in the chart. Nothing per patient     Patient Stated Goals  to be ableto reach his shoulder overhead     Currently in Pain?  No/denies                       [facility] Adult PT Treatment/Exercise - 03/07/18 0001      Shoulder Exercises: Sidelying   External Rotation Weight (lbs)  1     External Rotation Limitations  x10 no weight x10 1 lb       Shoulder Exercises: Standing   Internal Rotation  Limitations    Internal Rotation Limitations  red 2x10     Extension Limitations  2 x 10 green    Row Limitations  2 x 10 green      Shoulder Exercises: Pulleys   Flexion  2 minutes      Manual Therapy   Joint Mobilization  A/P and inferior mobs and PA mobs for all motion Grade II and III    Passive ROM  Flexion, abduction, IR, ER              PT Education - 03/07/18 0931    Education Details  reviewed HEP     Person(s) Educated  Patient    Methods  Explanation    Comprehension  Verbalized understanding;Returned demonstration;Verbal cues required;Tactile cues required       PT Short Term Goals - 03/07/18 1001      PT SHORT TERM GOAL #1   Title  Patient will demsotrate full passive right  shouldr flexion and ER     Baseline  improving but still limited in ER    Time  3    Period  Weeks    Status  On-going      PT SHORT TERM GOAL #2   Title  Patient will demonstrate 4+/5 gross right shoulder strength     Baseline  working on strength     Time  3    Period  Weeks    Status  On-going      PT SHORT TERM GOAL #3   Title  Patient will be independent with inital HEP     Time  3    Period  Weeks    Status  On-going        PT Long Term Goals - 02/14/18 0812      PT LONG TERM GOAL #1   Title  Patient will reach behind his head in order to perfrom hygeine tasks     Time  8    Period  Weeks    Status  New    Target Date  04/11/18      PT LONG TERM GOAL #2   Title  Patient will reach behind back to L4-L5 with right arm without pain in order     Time  6    Period  Weeks    Status  New    Target Date  03/28/18      PT LONG TERM GOAL #3   Title  Patient will reach overhead to a shelf with a 2lb weight without pain in order to perfrom IADL's     Time  6    Period  Weeks    Status  New    Target Date  03/28/18            Plan - 03/06/18 1433     Clinical Impression Statement  Patient continues to have limited ER but it has improved to 40 degrees compared to 35 last visit. He tolerated ther-ex well.  He had pain with end range flexion and ER stretching but it did not last.     Clinical Presentation  Evolving    Clinical Decision Making  Moderate    Rehab Potential  Good    PT Frequency  2x / week    PT Duration  6 weeks    PT Treatment/Interventions  ADLs/Self Care Home Management;Cryotherapy;Electrical Stimulation;Iontophoresis 4mg /ml Dexamethasone;Therapeutic activities;Therapeutic exercise;Neuromuscular re-education;Patient/family education;Manual techniques;Passive range of motion;Dry needling;Taping    PT Next Visit Plan  begin manual therapy and joint mobilizations; add light RTC strengthenoing; consider ionto for bicpes tendinitis, consider [place] to right UE if needed. Add pulles; add serratus punch    PT Home Exercise Plan  wand flexion; scap retraction; shoulder extension , supine cane ER, supine protraction, isometric flexion, ER, IR     Consulted and Agree with Plan of Care  Patient       Patient will benefit from skilled therapeutic intervention in order to improve the following deficits and impairments:  Pain, Postural dysfunction, Impaired UE functional use, Decreased activity tolerance, Decreased endurance, Decreased range of motion, Decreased strength  Visit Diagnosis: Chronic right shoulder pain  Stiffness of right shoulder, not elsewhere classified  Muscle weakness (generalized)     Problem List Patient Active Problem List   Diagnosis Date Noted  . Musculoskeletal leg pain 07/15/2013  . DM type 2 (diabetes mellitus, type 2) ([place]) 04/01/2013  . Problems with sexual function 03/07/2010  . General medical  examination 03/07/2010  . Hyperlipemia 03/16/2009  . OBSTRUCTIVE SLEEP APNEA 03/16/2009  . Essential hypertension 03/15/2009  . Vitamin D deficiency 03/04/2009  . Edema 02/14/2009  . Obesity, unspecified  03/12/2008  . GERD (gastroesophageal reflux disease) 10/22/2007  . Pain in the chest 10/22/2007  . Unspecified sleep apnea 07/03/2005    Carney Living PT DPT  03/07/2018, 10:06 AM  [facility] [address] [place], [place], [address] Phone: [phone]   Fax:  [phone]  Name: Raymond [name] MRN: [id number] Date of Birth: 09-16-1950

## 2018-03-13 ENCOUNTER — Ambulatory Visit: Payer: BLUE CROSS/BLUE SHIELD | Admitting: Physical Therapy

## 2018-03-13 DIAGNOSIS — G8929 Other chronic pain: Secondary | ICD-10-CM

## 2018-03-13 DIAGNOSIS — M25511 Pain in right shoulder: Principal | ICD-10-CM

## 2018-03-13 DIAGNOSIS — M6281 Muscle weakness (generalized): Secondary | ICD-10-CM

## 2018-03-13 DIAGNOSIS — M25611 Stiffness of right shoulder, not elsewhere classified: Secondary | ICD-10-CM

## 2018-03-13 NOTE — Therapy (Signed)
[place], [place], [address] Phone: [phone]   Fax:  [phone]  Physical Therapy Treatment  Patient Details  Name: Raymond [name] MRN: [id number] Date of Birth: 02-Aug-1951 Referring Provider: Sherrie Mustache    Encounter Date: 03/13/2018  PT End of Session - 03/13/18 1452    Visit Number  5    Number of Visits  12    Date for PT Re-Evaluation  03/28/18    Authorization Type  BCBS     PT Start Time  1426   Patient was 211 minutes late    PT Stop Time  1500    PT Time Calculation (min)  34 min    Activity Tolerance  Patient tolerated treatment well    Behavior During Therapy  [facility] for tasks assessed/performed       Past Medical History:  Diagnosis Date  . Diabetes mellitus without complication ([place])   . Edema   . GERD (gastroesophageal reflux disease)   . Hyperlipidemia   . Hypertension   . Obesity, unspecified   . Problems with sexual function   . Sleep apnea    released per MD, no CPAP  . Unspecified sleep apnea   . Unspecified vitamin D deficiency     Past Surgical History:  Procedure Laterality Date  . CATARACT EXTRACTION  1964   Dr.Freed   . COLONOSCOPY    . HERNIA REPAIR  1987   Right Side, Dr.Kaufman     There were no vitals filed for this visit.  Subjective Assessment - 03/13/18 1430    Subjective  Patient reports his pain level at end ranges is about a 4/10. He reports it is just a little aggrevating. He reports he is able to put his deoderant on better.     Limitations  House hold activities;Lifting    Diagnostic tests  Nothing in the chart. Nothing per patient     Patient Stated Goals  to be ableto reach his shoulder overhead     Currently in Pain?  Yes    Pain Score  4     Pain Location  Shoulder    Pain Orientation  Right;Anterior    Pain Descriptors / Indicators  Aching    Pain Type  Chronic pain    Pain Onset  More than a month ago    Pain Frequency  Constant    Aggravating Factors   raising the arm     Pain Relieving Factors  avoiding motions that hurt     Effect of Pain on Daily Activities  difficulty reachning overhead                        [facility] Adult PT Treatment/Exercise - 03/13/18 0001      Shoulder Exercises: Supine   Other Supine Exercises  supine flex/ext in low range and abduction/ adduction 2x10 each       Shoulder Exercises: Sidelying   External Rotation Limitations  2x10 1 lb       Shoulder Exercises: Standing   Extension Limitations  2 x 10 green    Row Limitations  2 x 10 green      Shoulder Exercises: Pulleys   Flexion  2 minutes      Manual Therapy   Joint Mobilization  A/P and inferior mobs and PA mobs for all motion Grade II and III    Passive ROM  Flexion, abduction, IR, ER  PT Education - 03/13/18 1450    Education Details  reviewed benefits of ther-ex.     Person(s) Educated  Patient    Methods  Explanation;Demonstration;Tactile cues;Verbal cues    Comprehension  Returned demonstration;Verbalized understanding;Verbal cues required;Tactile cues required       PT Short Term Goals - 03/07/18 1001      PT SHORT TERM GOAL #1   Title  Patient will demsotrate full passive right shouldr flexion and ER     Baseline  improving but still limited in ER    Time  3    Period  Weeks    Status  On-going      PT SHORT TERM GOAL #2   Title  Patient will demonstrate 4+/5 gross right shoulder strength     Baseline  working on strength     Time  3    Period  Weeks    Status  On-going      PT SHORT TERM GOAL #3   Title  Patient will be independent with inital HEP     Time  3    Period  Weeks    Status  On-going        PT Long Term Goals - 02/14/18 0812      PT LONG TERM GOAL #1   Title  Patient will reach behind his head in order to perfrom hygeine tasks     Time  8    Period  Weeks    Status  New    Target Date  04/11/18      PT LONG TERM GOAL #2   Title  Patient will  reach behind back to L4-L5 with right arm without pain in order     Time  6    Period  Weeks    Status  New    Target Date  03/28/18      PT LONG TERM GOAL #3   Title  Patient will reach overhead to a shelf with a 2lb weight without pain in order to perfrom IADL's     Time  6    Period  Weeks    Status  New    Target Date  03/28/18            Plan - 03/13/18 1453    Clinical Impression Statement  Patient tolerated treatment well. Therapy woked on light flexion and abduction/ adduction strengthening with no increasee in pain. Per visual inspection the papatients ER is improving.     Clinical Presentation  Evolving    Clinical Decision Making  Moderate    Rehab Potential  Good    PT Frequency  2x / week    PT Duration  6 weeks    PT Treatment/Interventions  ADLs/Self Care Home Management;Cryotherapy;Electrical Stimulation;Iontophoresis 4mg /ml Dexamethasone;Therapeutic activities;Therapeutic exercise;Neuromuscular re-education;Patient/family education;Manual techniques;Passive range of motion;Dry needling;Taping    PT Next Visit Plan  begin manual therapy and joint mobilizations; add light RTC strengthenoing; consider ionto for bicpes tendinitis, consider [place] to right UE if needed. Add pulles; add serratus punch    PT Home Exercise Plan  wand flexion; scap retraction; shoulder extension , supine cane ER, supine protraction, isometric flexion, ER, IR     Consulted and Agree with Plan of Care  Patient       Patient will benefit from skilled therapeutic intervention in order to improve the following deficits and impairments:  Pain, Postural dysfunction, Impaired UE functional use, Decreased activity tolerance, Decreased endurance, Decreased range of motion,  Decreased strength  Visit Diagnosis: Chronic right shoulder pain  Stiffness of right shoulder, not elsewhere classified  Muscle weakness (generalized)     Problem List Patient Active Problem List   Diagnosis Date Noted  .  Musculoskeletal leg pain 07/15/2013  . DM type 2 (diabetes mellitus, type 2) ([place]) 04/01/2013  . Problems with sexual function 03/07/2010  . General medical examination 03/07/2010  . Hyperlipemia 03/16/2009  . OBSTRUCTIVE SLEEP APNEA 03/16/2009  . Essential hypertension 03/15/2009  . Vitamin D deficiency 03/04/2009  . Edema 02/14/2009  . Obesity, unspecified 03/12/2008  . GERD (gastroesophageal reflux disease) 10/22/2007  . Pain in the chest 10/22/2007  . Unspecified sleep apnea 07/03/2005    Carney Living 03/13/2018, 4:28 PM  [facility] [address] [place], [place], [address] Phone: [phone]   Fax:  [phone]  Name: SIR MALLIS MRN: [id number] Date of Birth: 12/21/1950

## 2018-03-19 ENCOUNTER — Ambulatory Visit: Payer: BLUE CROSS/BLUE SHIELD | Admitting: Physical Therapy

## 2018-03-19 DIAGNOSIS — M25511 Pain in right shoulder: Secondary | ICD-10-CM | POA: Diagnosis not present

## 2018-03-19 DIAGNOSIS — M6281 Muscle weakness (generalized): Secondary | ICD-10-CM

## 2018-03-19 DIAGNOSIS — G8929 Other chronic pain: Secondary | ICD-10-CM

## 2018-03-19 DIAGNOSIS — M25611 Stiffness of right shoulder, not elsewhere classified: Secondary | ICD-10-CM

## 2018-03-20 NOTE — Therapy (Signed)
[place], [place], [address] Phone: [phone]   Fax:  [phone]  Physical Therapy Treatment  Patient Details  Name: Raymond [name] MRN: [id number] Date of Birth: Jun 19, 1951 Referring Provider: Sherrie Mustache    Encounter Date: 03/19/2018  PT End of Session - 03/19/18 1556    Visit Number  6    Number of Visits  12    Date for PT Re-Evaluation  03/28/18    Authorization Type  BCBS     PT Start Time  1555   Patient was 210 minutes late    PT Stop Time  1635    PT Time Calculation (min)  40 min    Activity Tolerance  Patient tolerated treatment well    Behavior During Therapy  [facility] for tasks assessed/performed       Past Medical History:  Diagnosis Date  . Diabetes mellitus without complication ([place])   . Edema   . GERD (gastroesophageal reflux disease)   . Hyperlipidemia   . Hypertension   . Obesity, unspecified   . Problems with sexual function   . Sleep apnea    released per MD, no CPAP  . Unspecified sleep apnea   . Unspecified vitamin D deficiency     Past Surgical History:  Procedure Laterality Date  . CATARACT EXTRACTION  1964   Dr.Freed   . COLONOSCOPY    . HERNIA REPAIR  1987   Right Side, Dr.Kaufman     There were no vitals filed for this visit.  Subjective Assessment - 03/19/18 1556    Subjective  Patient reports his pain may be a little better. He was able to sleep on it somewhat the other night.   (Pended)     Limitations  House hold activities;Lifting  (Pended)     Diagnostic tests  Nothing in the chart. Nothing per patient   (Pended)     Patient Stated Goals  to reach overhead   (Pended)     Currently in Pain?  Yes  (Pended)     Pain Score  3   (Pended)     Pain Location  Shoulder  (Pended)     Pain Orientation  Right;Anterior  (Pended)     Pain Descriptors / Indicators  Aching  (Pended)     Pain Type  Chronic pain  (Pended)     Pain Onset  More than a month ago   (Pended)     Pain Frequency  Constant  (Pended)     Aggravating Factors   raising his arm   (Pended)                        [facility] Adult PT Treatment/Exercise - 03/20/18 0001      Shoulder Exercises: Supine   Other Supine Exercises  supine flex/ext in low range and abduction/ adduction 2x10 each       Shoulder Exercises: Sidelying   External Rotation Limitations  2x10 1 lb       Shoulder Exercises: Standing   Extension Limitations  2 x 10 green    Row Limitations  2 x 10 green      Shoulder Exercises: Pulleys   Flexion  2 minutes      Manual Therapy   Joint Mobilization  A/P and inferior mobs and PA mobs for all motion Grade II and III    Passive ROM  Flexion, abduction, IR, ER  PT Education - 03/20/18 1135    Education Details  reviwed new ther-ex     Person(s) Educated  Patient    Methods  Explanation;Demonstration;Tactile cues;Verbal cues    Comprehension  Verbalized understanding;Returned demonstration;Verbal cues required;Tactile cues required       PT Short Term Goals - 03/07/18 1001      PT SHORT TERM GOAL #1   Title  Patient will demsotrate full passive right shouldr flexion and ER     Baseline  improving but still limited in ER    Time  3    Period  Weeks    Status  On-going      PT SHORT TERM GOAL #2   Title  Patient will demonstrate 4+/5 gross right shoulder strength     Baseline  working on strength     Time  3    Period  Weeks    Status  On-going      PT SHORT TERM GOAL #3   Title  Patient will be independent with inital HEP     Time  3    Period  Weeks    Status  On-going        PT Long Term Goals - 02/14/18 0812      PT LONG TERM GOAL #1   Title  Patient will reach behind his head in order to perfrom hygeine tasks     Time  8    Period  Weeks    Status  New    Target Date  04/11/18      PT LONG TERM GOAL #2   Title  Patient will reach behind back to L4-L5 with right arm without pain in order     Time   6    Period  Weeks    Status  New    Target Date  03/28/18      PT LONG TERM GOAL #3   Title  Patient will reach overhead to a shelf with a 2lb weight without pain in order to perfrom IADL's     Time  6    Period  Weeks    Status  New    Target Date  03/28/18            Plan - 03/20/18 1137    Clinical Impression Statement  Patient range appears to be progressing. He has been doing his exercises at home more consitently. Therapy added standing shoulder flexion. He reported fatigue but no pain. He was encouraged to continue with his ER stretching at home.     Clinical Presentation  Evolving    Clinical Decision Making  Moderate    Rehab Potential  Good    PT Frequency  2x / week    PT Duration  6 weeks    PT Treatment/Interventions  ADLs/Self Care Home Management;Cryotherapy;Electrical Stimulation;Iontophoresis 4mg /ml Dexamethasone;Therapeutic activities;Therapeutic exercise;Neuromuscular re-education;Patient/family education;Manual techniques;Passive range of motion;Dry needling;Taping    PT Next Visit Plan  begin manual therapy and joint mobilizations; add light RTC strengthenoing; consider ionto for bicpes tendinitis, consider [place] to right UE if needed. Add pulles; add serratus punch    PT Home Exercise Plan  wand flexion; scap retraction; shoulder extension , supine cane ER, supine protraction, isometric flexion, ER, IR     Consulted and Agree with Plan of Care  Patient       Patient will benefit from skilled therapeutic intervention in order to improve the following deficits and impairments:  Pain, Postural dysfunction, Impaired UE  functional use, Decreased activity tolerance, Decreased endurance, Decreased range of motion, Decreased strength  Visit Diagnosis: Chronic right shoulder pain  Stiffness of right shoulder, not elsewhere classified  Muscle weakness (generalized)     Problem List Patient Active Problem List   Diagnosis Date Noted  . Musculoskeletal leg  pain 07/15/2013  . DM type 2 (diabetes mellitus, type 2) ([place]) 04/01/2013  . Problems with sexual function 03/07/2010  . General medical examination 03/07/2010  . Hyperlipemia 03/16/2009  . OBSTRUCTIVE SLEEP APNEA 03/16/2009  . Essential hypertension 03/15/2009  . Vitamin D deficiency 03/04/2009  . Edema 02/14/2009  . Obesity, unspecified 03/12/2008  . GERD (gastroesophageal reflux disease) 10/22/2007  . Pain in the chest 10/22/2007  . Unspecified sleep apnea 07/03/2005    Carney Living PT DPT  03/20/2018, 11:41 AM  [facility] [address] [place], [place], [address] Phone: [phone]   Fax:  [phone]  Name: ADEYEMI HAMAD MRN: [id number] Date of Birth: 15-May-1951

## 2018-03-25 ENCOUNTER — Ambulatory Visit: Payer: BLUE CROSS/BLUE SHIELD | Admitting: Physical Therapy

## 2018-03-25 ENCOUNTER — Encounter: Payer: Self-pay | Admitting: Physical Therapy

## 2018-03-25 DIAGNOSIS — G8929 Other chronic pain: Secondary | ICD-10-CM

## 2018-03-25 DIAGNOSIS — M25511 Pain in right shoulder: Principal | ICD-10-CM

## 2018-03-25 DIAGNOSIS — M25611 Stiffness of right shoulder, not elsewhere classified: Secondary | ICD-10-CM

## 2018-03-25 DIAGNOSIS — M6281 Muscle weakness (generalized): Secondary | ICD-10-CM

## 2018-03-26 ENCOUNTER — Encounter: Payer: Self-pay | Admitting: Physical Therapy

## 2018-03-26 NOTE — Therapy (Signed)
[place], [place], [address] Phone: [phone]   Fax:  [phone]  Physical Therapy Treatment/[place]   Patient Details  Name: Raymond [name] MRN: [id number] Date of Birth: 07/04/1951 Referring Provider: Sherrie Mustache    Encounter Date: 03/25/2018  PT End of Session - 03/25/18 1557    Visit Number  7    Number of Visits  12    Date for PT Re-Evaluation  03/28/18    Authorization Type  BCBS     PT Start Time  9326   Patient 8 minutes late   PT Stop Time  1631    PT Time Calculation (min)  38 min    Activity Tolerance  Patient tolerated treatment well    Behavior During Therapy  [facility] for tasks assessed/performed       Past Medical History:  Diagnosis Date  . Diabetes mellitus without complication ([place])   . Edema   . GERD (gastroesophageal reflux disease)   . Hyperlipidemia   . Hypertension   . Obesity, unspecified   . Problems with sexual function   . Sleep apnea    released per MD, no CPAP  . Unspecified sleep apnea   . Unspecified vitamin D deficiency     Past Surgical History:  Procedure Laterality Date  . CATARACT EXTRACTION  1964   Dr.Freed   . COLONOSCOPY    . HERNIA REPAIR  1987   Right Side, Dr.Kaufman     There were no vitals filed for this visit.  Subjective Assessment - 03/25/18 1555    Subjective  Patient isa not having pain today. He slept on it the other day and had some pain. He had a little pain reachingup today.     Limitations  House hold activities;Lifting    Diagnostic tests  Nothing in the chart. Nothing per patient     Patient Stated Goals  to be ableto reach his shoulder overhead     Currently in Pain?  No/denies                               PT Education - 03/25/18 1556    Education Details  reviewed HEP     Person(s) Educated  Patient    Methods  Explanation;Demonstration;Tactile cues;Verbal cues    Comprehension  Verbalized  understanding;Returned demonstration;Verbal cues required;Tactile cues required       PT Short Term Goals - 03/25/18 1612      PT SHORT TERM GOAL #1   Title  Patient will demsotrate full passive right shouldr flexion and ER     Baseline  still limited ER     Time  3    Period  Weeks    Status  Partially Met      PT SHORT TERM GOAL #2   Title  Patient will demonstrate 4+/5 gross right shoulder strength     Baseline  4+/5 grosss    Time  3    Period  Weeks    Status  Achieved      PT SHORT TERM GOAL #3   Title  Patient will be independent with inital HEP     Baseline  reviewed exercises     Time  3    Period  Weeks    Status  Achieved        PT Long Term Goals - 03/25/18 1615      PT  LONG TERM GOAL #1   Title  Patient will reach behind his head in order to perfrom hygeine tasks     Baseline  can reach fully behind his head without pain     Time  8    Period  Weeks    Status  New      PT LONG TERM GOAL #2   Title  Patient will reach behind back to L4-L5 with right arm without pain in order     Baseline  can reach to L5 without pain     Time  6    Period  Weeks    Status  Achieved      PT LONG TERM GOAL #3   Title  Patient will reach overhead to a shelf with a 2lb weight without pain in order to perfrom IADL's     Baseline  perfromed 10x without pain     Time  6    Period  Weeks    Status  Achieved            Plan - 03/25/18 1558    Clinical Impression Statement  Patient has made good progress. He has only minor pain when reaching overhead. He is also still limited in ER bvut he is progreessing. He feels comfortable with his exercises. He isready to work on them at home on his own. His strength and functional mobility has improved. Patient has met or partially met all goals.     Clinical Presentation  Evolving    Clinical Decision Making  Moderate    Rehab Potential  Good    PT Frequency  2x / week    PT Duration  6 weeks    PT Treatment/Interventions   ADLs/Self Care Home Management;Cryotherapy;Electrical Stimulation;Iontophoresis 75m/ml Dexamethasone;Therapeutic activities;Therapeutic exercise;Neuromuscular re-education;Patient/family education;Manual techniques;Passive range of motion;Dry needling;Taping    PT Next Visit Plan  begin manual therapy and joint mobilizations; add light RTC strengthenoing; consider ionto for bicpes tendinitis, consider [place] right UE if needed. Add pulles; add serratus punch    PT Home Exercise Plan  wand flexion; scap retraction; shoulder extension , supine cane ER, supine protraction, isometric flexion, ER, IR     Consulted and Agree with Plan of Care  Patient       Patient will benefit from skilled therapeutic intervention in order to improve the following deficits and impairments:  Pain, Postural dysfunction, Impaired UE functional use, Decreased activity tolerance, Decreased endurance, Decreased range of motion, Decreased strength  Visit Diagnosis: Chronic right shoulder pain  Stiffness of right shoulder, not elsewhere classified  Muscle weakness (generalized)  PHYSICAL THERAPY DISCHARGE SUMMARY  Visits from Start of Care: 7   Current functional level related to goals / functional outcomes: significant improvements in range and function    Remaining deficits: Limited motion of the shoulder ER but improved    Education / Equipment: HEP   Plan: Patient agrees to discharge.  Patient goals were met. Patient is being discharged due to meeting the stated rehab goals.  ?????       Problem List Patient Active Problem List   Diagnosis Date Noted  . Musculoskeletal leg pain 07/15/2013  . DM type 2 (diabetes mellitus, type 2) ([place] 04/01/2013  . Problems with sexual function 03/07/2010  . General medical examination 03/07/2010  . Hyperlipemia 03/16/2009  . OBSTRUCTIVE SLEEP APNEA 03/16/2009  . Essential hypertension 03/15/2009  . Vitamin D deficiency 03/04/2009  . Edema 02/14/2009  . Obesity,  unspecified 03/12/2008  .  GERD (gastroesophageal reflux disease) 10/22/2007  . Pain in the chest 10/22/2007  . Unspecified sleep apnea 07/03/2005    Carney Living  PT DPT  03/26/2018, 11:19 AM  [facility] [address] [place], [place], [address] Phone: [phone]   Fax:  [phone]  Name: Raymond [name] MRN: [id number] Date of Birth: 1950/08/28

## 2018-05-30 ENCOUNTER — Ambulatory Visit
Admission: RE | Admit: 2018-05-30 | Discharge: 2018-05-30 | Disposition: A | Discharge status: Home / Self Care | Payer: Medicare Other | Source: Ambulatory Visit | Attending: Internal Medicine | Admitting: Internal Medicine

## 2018-05-30 ENCOUNTER — Ambulatory Visit (INDEPENDENT_AMBULATORY_CARE_PROVIDER_SITE_OTHER): Payer: Medicare Other | Admitting: Internal Medicine

## 2018-05-30 ENCOUNTER — Encounter: Payer: Self-pay | Admitting: Internal Medicine

## 2018-05-30 VITALS — BP 110/78 | HR 65 | Temp 97.5°F | Ht 72.0 in | Wt 275.0 lb

## 2018-05-30 DIAGNOSIS — E119 Type 2 diabetes mellitus without complications: Secondary | ICD-10-CM | POA: Diagnosis not present

## 2018-05-30 DIAGNOSIS — Z23 Encounter for immunization: Secondary | ICD-10-CM | POA: Diagnosis not present

## 2018-05-30 DIAGNOSIS — E785 Hyperlipidemia, unspecified: Secondary | ICD-10-CM

## 2018-05-30 DIAGNOSIS — Z6837 Body mass index (BMI) 37.0-37.9, adult: Secondary | ICD-10-CM

## 2018-05-30 DIAGNOSIS — R0781 Pleurodynia: Secondary | ICD-10-CM

## 2018-05-30 DIAGNOSIS — I10 Essential (primary) hypertension: Secondary | ICD-10-CM | POA: Diagnosis not present

## 2018-05-30 MED ORDER — ATORVASTATIN CALCIUM 40 MG PO TABS: ORAL_TABLET | ORAL | 1 refills | Status: DC

## 2018-05-30 NOTE — Progress Notes (Signed)
Patient ID: Raymond [name], male   DOB: October 09, 1950, 67 y.o.   MRN: [id number]   Location:  [facility] OFFICE  Provider: DR Arletha Grippe  Code Status:  Goals of Care:  Advanced Directives 02/13/2018  Does Patient Have a Medical Advance Directive? No  Would patient like information on creating a medical advance directive? No - Patient declined     Chief Complaint  Patient presents with  . Medical Management of Chronic Issues  . Health Maintenance    Patient with eye doctor, Dr.Hecker  . Immunizations    Flu Vaccine today     HPI: Patient is a 67 y.o. male seen today for medical management of chronic diseases.  He is c/a weight gain - up 18 lbs since June 2019. He does not make healthy food choices. He does not have a regular exercise routine.   He has noticed left lower rib cage discomfort x several mos. He does sleep on left side. No pain with deep inspiration but pain worsens with twisting trunk.  DM - stable. No low BS reactions. Takes metformin. a1c 6.9%; he takes statin and ACEI  Hyperlipidemia - stable on lipitor; LDL 71; HDL 28. No myalgias  HTN - stable in enalapril. Takes ASA daily   Past Medical History:  Diagnosis Date  . Diabetes mellitus without complication ([place])   . Edema   . GERD (gastroesophageal reflux disease)   . Hyperlipidemia   . Hypertension   . Obesity, unspecified   . Problems with sexual function   . Sleep apnea    released per MD, no CPAP  . Unspecified sleep apnea   . Unspecified vitamin D deficiency     Past Surgical History:  Procedure Laterality Date  . CATARACT EXTRACTION  1964   Dr.Freed   . COLONOSCOPY    . HERNIA REPAIR  1987   Right Side, Dr.Kaufman      reports that he has never smoked. He has never used smokeless tobacco. He reports that he does not drink alcohol or use drugs. Social History   Socioeconomic History  . Marital status: Married    Spouse name: Not on file  . Number of children: Not on file  . Years of  education: Not on file  . Highest education level: Not on file  Occupational History  . Not on file  Social Needs  . Financial resource strain: Not on file  . Food insecurity:    Worry: Not on file    Inability: Not on file  . Transportation needs:    Medical: Not on file    Non-medical: Not on file  Tobacco Use  . Smoking status: Never Smoker  . Smokeless tobacco: Never Used  Substance and Sexual Activity  . Alcohol use: No  . Drug use: No  . Sexual activity: Yes  Lifestyle  . Physical activity:    Days per week: Not on file    Minutes per session: Not on file  . Stress: Not on file  Relationships  . Social connections:    Talks on phone: Not on file    Gets together: Not on file    Attends religious service: Not on file    Active member of club or organization: Not on file    Attends meetings of clubs or organizations: Not on file    Relationship status: Not on file  . Intimate partner violence:    Fear of current or ex partner: Not on file  Emotionally abused: Not on file    Physically abused: Not on file    Forced sexual activity: Not on file  Other Topics Concern  . Not on file  Social History Narrative  . Not on file    Family History  Problem Relation Age of Onset  . Colon cancer Mother   . Esophageal cancer Neg Hx   . Stomach cancer Neg Hx   . Rectal cancer Neg Hx     No Known Allergies  Outpatient Encounter Medications as of 05/30/2018  Medication Sig  . aspirin 81 MG tablet Take 81 mg by mouth daily.    Marland Kitchen atorvastatin (LIPITOR) 40 MG tablet TAKE 1 TABLET BY MOUTH EVERYDAY AT BEDTIME  . cholecalciferol (VITAMIN D) 1000 UNITS tablet Take 1,000 Units by mouth daily. Take 1 tablet daily  . enalapril (VASOTEC) 5 MG tablet Take one tablet by mouth once daily  . glucose blood test strip Freestyle Lite Test Strip: Use to check blood sugar 2-3 x weekly Dx: E11.9  . metFORMIN (GLUCOPHAGE) 500 MG tablet TAKE 1 TABLET BY MOUTH TWICE A DAY WITH A MEAL   No  facility-administered encounter medications on file as of 05/30/2018.     Review of Systems:  Review of Systems  Constitutional: Positive for unexpected weight change (gained 18 lbs).  Cardiovascular: Positive for leg swelling.  Musculoskeletal:       Left rib pain  All other systems reviewed and are negative.   Health Maintenance  Topic Date Due  . OPHTHALMOLOGY EXAM  01/14/2018  . INFLUENZA VACCINE  03/06/2018  . HEMOGLOBIN A1C  07/25/2018  . FOOT EXAM  01/28/2019  . COLONOSCOPY  02/10/2019  . TETANUS/TDAP  12/02/2023  . Hepatitis C Screening  Completed  . PNA vac Low Risk Adult  Completed    Physical Exam: Vitals:   05/30/18 1128  BP: 110/78  Pulse: 65  Temp: (!) 97.5 F (36.4 C)  TempSrc: Oral  SpO2: 95%  Weight: 275 lb (124.7 kg)  Height: 6' (1.829 m)   Body mass index is 37.3 kg/m. Physical Exam  Constitutional: He is oriented to person, place, and time. He appears well-developed and well-nourished.  HENT:  Mouth/Throat: Oropharynx is clear and moist.  MMM; no oral thrush  Eyes: Pupils are equal, round, and reactive to light. No scleral icterus.  Neck: Neck supple. Carotid bruit is not present. No thyromegaly present.  Cardiovascular: Normal rate, regular rhythm and intact distal pulses. Exam reveals no gallop and no friction rub.  Murmur (1/6 SEM) heard. Trace LE edema b/l; no calf TTP  Pulmonary/Chest: Effort normal and breath sounds normal. He has no wheezes. He has no rales. He exhibits no tenderness.  Abdominal: Soft. Bowel sounds are normal. He exhibits no distension, no abdominal bruit, no pulsatile midline mass and no mass. There is no hepatomegaly. There is no tenderness. There is no rebound and no guarding.  obese  Musculoskeletal: He exhibits edema (small and large joints) and tenderness (left rib 9-10 stuck up and TTP; no palpable mass).  Lymphadenopathy:    He has no cervical adenopathy.  Neurological: He is alert and oriented to person, place,  and time. He has normal reflexes.  Skin: Skin is warm and dry. No rash noted.  Psychiatric: He has a normal mood and affect. His behavior is normal. Judgment and thought content normal.    Labs reviewed: Basic Metabolic Panel: Recent Labs    08/26/17 1011 01/23/18 0810  NA 136 136  K  4.5 4.6  CL 105 103  CO2 23 24  GLUCOSE 110* 122*  BUN 16 17  CREATININE 0.97 1.10  CALCIUM 9.9 9.4   Liver Function Tests: Recent Labs    08/26/17 1011 01/23/18 0810  AST 28 20  ALT 41 26  BILITOT 0.8 0.7  PROT 7.1 6.7   No results for input(s): LIPASE, AMYLASE in the last 8760 hours. No results for input(s): AMMONIA in the last 8760 hours. CBC: Recent Labs    08/26/17 1011 01/23/18 0810  WBC 3.3* 4.0  NEUTROABS 1,548 1,812  HGB 14.2 13.8  HCT 41.6 40.6  MCV 79.5* 80.1  PLT 240 226   Lipid Panel: Recent Labs    01/23/18 0810  CHOL 121  HDL 28*  LDLCALC 71  TRIG 139  CHOLHDL 4.3   Lab Results  Component Value Date   HGBA1C 6.9 (H) 01/23/2018    Procedures since last visit: No results found.  Assessment/Plan   ICD-10-CM   1. Rib pain on left side R07.81 DG Ribs Unilateral Left  2. Type 2 diabetes mellitus without complication, unspecified whether long term insulin use (HCC) E11.9 Hemoglobin A1c    CMP with eGFR([facility])    CBC with Differential/Platelets    Microalbumin/Creatinine Ratio, Urine  3. Hyperlipidemia, unspecified hyperlipidemia type E78.5 atorvastatin (LIPITOR) 40 MG tablet    Lipid Panel  4. Essential hypertension I10 CBC with Differential/Platelets  5. Class 2 severe obesity due to excess calories with serious comorbidity and body mass index (BMI) of 37.0 to 37.9 in adult [facility]) E66.01    Z68.37    Pending appointment with Dr.Hecker scheduled for 07/16/18 @ 1:20 pm  Flu shot given today  Continue current medications as ordered  Discussed importance of weight loss - reduce portion sizes; increase exercise as tolerated  Follow up in 6 mos for  welcome to Medicare exam with [name]. Will need fasting labs at appt after welcome to [place]. Perlie Gold  [facility] [address] [place], [place] [address] [phone] Cell (Monday-Friday 8 AM - 5 PM) [phone] After 5 PM and follow prompts

## 2018-05-30 NOTE — Patient Instructions (Addendum)
Pending appointment with Dr.Hecker scheduled for 07/16/18 @ 1:20 pm  Flu shot given today  Continue current medications as ordered  Discussed importance of weight loss - reduce portion sizes; increase exercise as tolerated  Follow up in 6 mos for welcome to Medicare exam with [name].

## 2018-05-31 LAB — COMPLETE METABOLIC PANEL WITH GFR
AG Ratio: 1.8 (calc) (ref 1.0–2.5)
ALT: 32 U/L (ref 9–46)
AST: 23 U/L (ref 10–35)
Albumin: 4.4 g/dL (ref 3.6–5.1)
Alkaline phosphatase (APISO): 57 U/L (ref 40–115)
BUN: 14 mg/dL (ref 7–25)
CO2: 24 mmol/L (ref 20–32)
CREATININE: 1.03 mg/dL (ref 0.70–1.25)
Calcium: 9.5 mg/dL (ref 8.6–10.3)
Chloride: 101 mmol/L (ref 98–110)
GFR, Est African American: 87 mL/min/{1.73_m2} (ref >=60)
GFR, Est Non African American: 75 mL/min/{1.73_m2} (ref >=60)
GLOBULIN: 2.5 g/dL (ref 1.9–3.7)
Glucose, Bld: 117 mg/dL (ref 65–139)
Potassium: 4.6 mmol/L (ref 3.5–5.3)
SODIUM: 136 mmol/L (ref 135–146)
Total Bilirubin: 0.7 mg/dL (ref 0.2–1.2)
Total Protein: 6.9 g/dL (ref 6.1–8.1)

## 2018-05-31 LAB — CBC WITH DIFFERENTIAL/PLATELET
BASOS ABS: 22 {cells}/uL (ref 0–200)
Basophils Relative: 0.5 %
EOS PCT: 3.7 %
Eosinophils Absolute: 159 cells/uL (ref 15–500)
HEMATOCRIT: 41.3 % (ref 38.5–50.0)
Hemoglobin: 14.3 g/dL (ref 13.2–17.1)
Lymphs Abs: 1651 cells/uL (ref 850–3900)
MCH: 27.8 pg (ref 27.0–33.0)
MCHC: 34.6 g/dL (ref 32.0–36.0)
MCV: 80.2 fL (ref 80.0–100.0)
MPV: 10.5 fL (ref 7.5–12.5)
Monocytes Relative: 11.2 %
NEUTROS PCT: 46.2 %
Neutro Abs: 1987 cells/uL (ref 1500–7800)
PLATELETS: 223 10*3/uL (ref 140–400)
RBC: 5.15 10*6/uL (ref 4.20–5.80)
RDW: 14 % (ref 11.0–15.0)
TOTAL LYMPHOCYTE: 38.4 %
WBC mixed population: 482 cells/uL (ref 200–950)
WBC: 4.3 10*3/uL (ref 3.8–10.8)

## 2018-05-31 LAB — LIPID PANEL
Cholesterol: 133 mg/dL (ref <200)
HDL: 30 mg/dL — AB (ref >40)
LDL CHOLESTEROL (CALC): 72 mg/dL
NON-HDL CHOLESTEROL (CALC): 103 mg/dL (ref <130)
TRIGLYCERIDES: 224 mg/dL — AB (ref <150)
Total CHOL/HDL Ratio: 4.4 (calc) (ref <5.0)

## 2018-05-31 LAB — MICROALBUMIN / CREATININE URINE RATIO
Creatinine, Urine: 39 mg/dL (ref 20–320)
Microalb Creat Ratio: 10 mcg/mg creat (ref <30)
Microalb, Ur: 0.4 mg/dL

## 2018-05-31 LAB — HEMOGLOBIN A1C
Hgb A1c MFr Bld: 7.7 % of total Hgb — ABNORMAL HIGH (ref <5.7)
MEAN PLASMA GLUCOSE: 174 (calc)
eAG (mmol/L): 9.7 (calc)

## 2018-07-12 ENCOUNTER — Other Ambulatory Visit: Payer: Self-pay | Admitting: Nurse Practitioner

## 2018-07-16 DIAGNOSIS — H40013 Open angle with borderline findings, low risk, bilateral: Secondary | ICD-10-CM | POA: Diagnosis not present

## 2018-07-16 DIAGNOSIS — H25012 Cortical age-related cataract, left eye: Secondary | ICD-10-CM | POA: Diagnosis not present

## 2018-07-16 DIAGNOSIS — H2512 Age-related nuclear cataract, left eye: Secondary | ICD-10-CM | POA: Diagnosis not present

## 2018-07-16 DIAGNOSIS — H35363 Drusen (degenerative) of macula, bilateral: Secondary | ICD-10-CM | POA: Diagnosis not present

## 2018-07-16 DIAGNOSIS — E119 Type 2 diabetes mellitus without complications: Secondary | ICD-10-CM | POA: Diagnosis not present

## 2018-07-16 LAB — HM DIABETES EYE EXAM

## 2018-07-17 ENCOUNTER — Encounter: Payer: Self-pay | Admitting: *Deleted

## 2018-08-14 DIAGNOSIS — J069 Acute upper respiratory infection, unspecified: Secondary | ICD-10-CM | POA: Diagnosis not present

## 2018-09-15 ENCOUNTER — Other Ambulatory Visit: Payer: Self-pay | Admitting: Nurse Practitioner

## 2018-10-10 ENCOUNTER — Other Ambulatory Visit: Payer: Self-pay | Admitting: Nurse Practitioner

## 2018-10-10 NOTE — Telephone Encounter (Signed)
Agree he needs appt. Lets give 2 weeks supply and make sure he makes an appt

## 2018-10-10 NOTE — Telephone Encounter (Signed)
Left message on voicemail (home number) for patient to return call when available   Left message on voicemail (mobile number) for patient to return call when available    Reason for call: schedule 6 month follow-up appointment

## 2018-10-10 NOTE — Telephone Encounter (Signed)
Last appointment 01/27/2018, told appointment overdue on last refill (09/16/2018)  Last A1c 05/30/2018 (7.7)  No pending appointment  Please advise

## 2018-10-10 NOTE — Telephone Encounter (Signed)
Patient scheduled appointment for May 2020

## 2018-10-26 ENCOUNTER — Other Ambulatory Visit: Payer: Self-pay | Admitting: Nurse Practitioner

## 2018-10-27 NOTE — Telephone Encounter (Signed)
This encounter was created in error - please disregard.

## 2018-10-27 NOTE — Telephone Encounter (Signed)
Request for refill completed and sent to pharmacy of choice. Last refill stated he needed an appointment to refill. He has an appointment scheduled for 01/02/19.

## 2018-11-27 ENCOUNTER — Other Ambulatory Visit: Payer: Self-pay | Admitting: Nurse Practitioner

## 2018-12-05 ENCOUNTER — Other Ambulatory Visit: Payer: Self-pay | Admitting: *Deleted

## 2018-12-05 ENCOUNTER — Other Ambulatory Visit: Payer: Self-pay | Admitting: Nurse Practitioner

## 2018-12-05 DIAGNOSIS — E785 Hyperlipidemia, unspecified: Secondary | ICD-10-CM

## 2018-12-05 MED ORDER — ATORVASTATIN CALCIUM 40 MG PO TABS: ORAL_TABLET | ORAL | 1 refills | Status: DC

## 2018-12-05 NOTE — Telephone Encounter (Signed)
[facility] 

## 2018-12-07 ENCOUNTER — Other Ambulatory Visit: Payer: Self-pay | Admitting: Nurse Practitioner

## 2018-12-12 ENCOUNTER — Ambulatory Visit (INDEPENDENT_AMBULATORY_CARE_PROVIDER_SITE_OTHER): Payer: Medicare Other | Admitting: Nurse Practitioner

## 2018-12-12 ENCOUNTER — Other Ambulatory Visit: Payer: Self-pay

## 2018-12-12 ENCOUNTER — Encounter: Payer: Self-pay | Admitting: Nurse Practitioner

## 2018-12-12 DIAGNOSIS — E785 Hyperlipidemia, unspecified: Secondary | ICD-10-CM

## 2018-12-12 DIAGNOSIS — K219 Gastro-esophageal reflux disease without esophagitis: Secondary | ICD-10-CM

## 2018-12-12 DIAGNOSIS — E119 Type 2 diabetes mellitus without complications: Secondary | ICD-10-CM

## 2018-12-12 DIAGNOSIS — I10 Essential (primary) hypertension: Secondary | ICD-10-CM | POA: Diagnosis not present

## 2018-12-12 DIAGNOSIS — Z6837 Body mass index (BMI) 37.0-37.9, adult: Secondary | ICD-10-CM

## 2018-12-12 NOTE — Patient Instructions (Signed)
Continue diet modifications with increase in activity.   Follow up in office to update labs Keep follow up appts    Raymond [name] stands for "Dietary Approaches to Stop Hypertension." The DASH eating plan is a healthy eating plan that has been shown to reduce high blood pressure (hypertension). It may also reduce your risk for type 2 diabetes, heart disease, and stroke. The DASH eating plan may also help with weight loss. What are tips for following this plan?  General guidelines  Avoid eating more than 2,300 mg (milligrams) of salt (sodium) a day. If you have hypertension, you may need to reduce your sodium intake to 1,500 mg a day.  Limit alcohol intake to no more than 1 drink a day for nonpregnant women and 2 drinks a day for men. One drink equals 12 oz of beer, 5 oz of wine, or 1 oz of hard liquor.  Work with your health care provider to maintain a healthy body weight or to lose weight. Ask what an ideal weight is for you.  Get at least 30 minutes of exercise that causes your heart to beat faster (aerobic exercise) most days of the week. Activities may include walking, swimming, or biking.  Work with your health care provider or diet and nutrition specialist (dietitian) to adjust your eating plan to your individual calorie needs. Reading food labels   Check food labels for the amount of sodium per serving. Choose foods with less than 5 percent of the Daily Value of sodium. Generally, foods with less than 300 mg of sodium per serving fit into this eating plan.  To find whole grains, look for the word "whole" as the first word in the ingredient list. Shopping  Buy products labeled as "low-sodium" or "no salt added."  Buy fresh foods. Avoid canned foods and premade or frozen meals. Cooking  Avoid adding salt when cooking. Use salt-free seasonings or herbs instead of table salt or sea salt. Check with your health care provider or pharmacist before using salt substitutes.   Do not fry foods. Cook foods using healthy methods such as baking, boiling, grilling, and broiling instead.  Cook with heart-healthy oils, such as olive, canola, soybean, or sunflower oil. Meal planning  Eat a balanced diet that includes: ? 5 or more servings of fruits and vegetables each day. At each meal, try to fill half of your plate with fruits and vegetables. ? Up to 6-8 servings of whole grains each day. ? Less than 6 oz of lean meat, poultry, or fish each day. A 3-oz serving of meat is about the same size as a deck of cards. One egg equals 1 oz. ? 2 servings of low-fat dairy each day. ? A serving of nuts, seeds, or beans 5 times each week. ? Heart-healthy fats. Healthy fats called Omega-3 fatty acids are found in foods such as flaxseeds and coldwater fish, like sardines, salmon, and mackerel.  Limit how much you eat of the following: ? Canned or prepackaged foods. ? Food that is high in trans fat, such as fried foods. ? Food that is high in saturated fat, such as fatty meat. ? Sweets, desserts, sugary drinks, and other foods with added sugar. ? Full-fat dairy products.  Do not salt foods before eating.  Try to eat at least 2 vegetarian meals each week.  Eat more home-cooked food and less restaurant, buffet, and fast food.  When eating at a restaurant, ask that your food be prepared with less salt  or no salt, if possible. What foods are recommended? The items listed may not be a complete list. Talk with your dietitian about what dietary choices are best for you. Grains Whole-grain or whole-wheat bread. Whole-grain or whole-wheat pasta. Brown rice. Raymond [name]. Bulgur. Whole-grain and low-sodium cereals. Pita bread. Low-fat, low-sodium crackers. Whole-wheat flour tortillas. Vegetables Fresh or frozen vegetables (raw, steamed, roasted, or grilled). Low-sodium or reduced-sodium tomato and vegetable juice. Low-sodium or reduced-sodium tomato sauce and tomato paste. Low-sodium  or reduced-sodium canned vegetables. Fruits All fresh, dried, or frozen fruit. Canned fruit in natural juice (without added sugar). Meat and other protein foods Skinless chicken or [place]. Ground chicken or [place]. Pork with fat trimmed off. Fish and seafood. Egg whites. Dried beans, peas, or lentils. Unsalted nuts, nut butters, and seeds. Unsalted canned beans. Lean cuts of beef with fat trimmed off. Low-sodium, lean deli meat. Dairy Low-fat (1%) or fat-free (skim) milk. Fat-free, low-fat, or reduced-fat cheeses. Nonfat, low-sodium ricotta or cottage cheese. Low-fat or nonfat yogurt. Low-fat, low-sodium cheese. Fats and oils Soft margarine without trans fats. Vegetable oil. Low-fat, reduced-fat, or light mayonnaise and salad dressings (reduced-sodium). Canola, safflower, olive, soybean, and sunflower oils. Avocado. Seasoning and other foods Herbs. Spices. Seasoning mixes without salt. Unsalted popcorn and pretzels. Fat-free sweets. What foods are not recommended? The items listed may not be a complete list. Talk with your dietitian about what dietary choices are best for you. Grains Baked goods made with fat, such as croissants, muffins, or some breads. Dry pasta or rice meal packs. Vegetables Creamed or fried vegetables. Vegetables in a cheese sauce. Regular canned vegetables (not low-sodium or reduced-sodium). Regular canned tomato sauce and paste (not low-sodium or reduced-sodium). Regular tomato and vegetable juice (not low-sodium or reduced-sodium). Raymond [name]. Olives. Fruits Canned fruit in a light or heavy syrup. Fried fruit. Fruit in cream or butter sauce. Meat and other protein foods Fatty cuts of meat. Ribs. Fried meat. Raymond [name]. Sausage. Bologna and other processed lunch meats. Salami. Fatback. Hotdogs. Bratwurst. Salted nuts and seeds. Canned beans with added salt. Canned or smoked fish. Whole eggs or egg yolks. Chicken or [place] with skin. Dairy Whole or 2% milk, cream, and  half-and-half. Whole or full-fat cream cheese. Whole-fat or sweetened yogurt. Full-fat cheese. Nondairy creamers. Whipped toppings. Processed cheese and cheese spreads. Fats and oils Butter. Stick margarine. Lard. Shortening. Ghee. Bacon fat. Tropical oils, such as coconut, palm kernel, or palm oil. Seasoning and other foods Salted popcorn and pretzels. Onion salt, garlic salt, seasoned salt, table salt, and sea salt. Worcestershire sauce. Tartar sauce. Barbecue sauce. Teriyaki sauce. Soy sauce, including reduced-sodium. Steak sauce. Canned and packaged gravies. Fish sauce. Oyster sauce. Cocktail sauce. Horseradish that you find on the shelf. Ketchup. Mustard. Meat flavorings and tenderizers. Bouillon cubes. Hot sauce and Tabasco sauce. Premade or packaged marinades. Premade or packaged taco seasonings. Relishes. Regular salad dressings. Where to find more information:  National Heart, Lung, and [place]: https://wilson-eaton.com/  American Heart Association: www.heart.org Summary  The DASH eating plan is a healthy eating plan that has been shown to reduce high blood pressure (hypertension). It may also reduce your risk for type 2 diabetes, heart disease, and stroke.  With the DASH eating plan, you should limit salt (sodium) intake to 2,300 mg a day. If you have hypertension, you may need to reduce your sodium intake to 1,500 mg a day.  When on the DASH eating plan, aim to eat more fresh fruits and vegetables, whole grains, lean proteins, low-fat dairy,  and heart-healthy fats.  Work with your health care provider or diet and nutrition specialist (dietitian) to adjust your eating plan to your individual calorie needs. This information is not intended to replace advice given to you by your health care provider. Make sure you discuss any questions you have with your health care provider. Document Released: 07/12/2011 Document Revised: 07/16/2016 Document Reviewed: 07/16/2016 Elsevier Interactive  Patient Education  2019 [employer].

## 2018-12-12 NOTE — Progress Notes (Signed)
Patient ID: Raymond [name], male   DOB: [birth year], 68 y.o.   MRN: [id number] This service is provided via telemedicine  No vital signs collected/recorded due to the encounter was a telemedicine visit.   Location of patient (ex: home, work):  WORK  Patient consents to a telephone visit:  YES  Location of the provider (ex: office, home):  OFFICE  Name of any referring provider:  N/A  Names of all persons participating in the telemedicine service and their role in the encounter:  PATIENT, DESHANNON SMITH CMA, Sherrie Mustache, NP  Time spent on call:  3:04      Careteam: Patient Care Team: [name], NP as PCP - General (Nurse Practitioner) [name], MD as Consulting Physician (Ophthalmology)  Advanced Directive information Does Patient Have a Medical Advance Directive?: No, Would patient like information on creating a medical advance directive?: No - Patient declined  No Known Allergies  Chief Complaint  Patient presents with  . Medical Management of Chronic Issues    6MTH FOLLOW-UP     HPI: Patient is a 68 y.o. male for routine follow up.  Reports he is doing well. Not losing weight has he wants to but now on a program. Tracking calories and exercises. Trying to increase steps per day. Walking twice daily.  Weight yesterday 257 lbs down from 265 lbs  DM- a1c 7.7 6 months ago. Blood sugars 120s-140s taking metformin 500 mg by mouth twice daily.   htn- 117/79, maintained on enalapril. Blood pressure is good when he walks.   Had some discomfort in his rib cage in October- negative xray- overall has improved, sometimes will feel discomfort- not a pain.   OSA- hx of, had cpap but doctor took him off, stated he did not need (had lost weight).   GERD- in the past, not having issues now with diet changes.   Review of Systems:  Review of Systems  Constitutional: Negative for chills, fever and weight loss.  Respiratory: Negative for cough, sputum production  and shortness of breath.   Cardiovascular: Positive for leg swelling (mild, ankle in evening). Negative for chest pain and palpitations.  Gastrointestinal: Negative for constipation, diarrhea and heartburn.  Genitourinary: Negative for dysuria, frequency and urgency.       Gets up 1-2 times at night to urination, stable over last year  Musculoskeletal: Negative for back pain, joint pain and myalgias.  Skin: Negative.   Neurological: Negative for dizziness and headaches.  Psychiatric/Behavioral: Negative for depression and memory loss. The patient does not have insomnia.     Past Medical History:  Diagnosis Date  . Diabetes mellitus without complication ([place])   . Edema   . GERD (gastroesophageal reflux disease)   . Hyperlipidemia   . Hypertension   . Obesity, unspecified   . Problems with sexual function   . Sleep apnea    released per MD, no CPAP  . Unspecified sleep apnea   . Unspecified vitamin D deficiency    Past Surgical History:  Procedure Laterality Date  . CATARACT EXTRACTION  1964   Dr.Freed   . COLONOSCOPY    . HERNIA REPAIR  1987   Right Side, Dr.Kaufman    Social History:   reports that he has never smoked. He has never used smokeless tobacco. He reports that he does not drink alcohol or use drugs.  Family History  Problem Relation Age of Onset  . Colon cancer Mother   . Esophageal cancer Neg Hx   .  Stomach cancer Neg Hx   . Rectal cancer Neg Hx     Medications: Patient's Medications  New Prescriptions   No medications on file  Previous Medications   ASPIRIN 81 MG TABLET    Take 81 mg by mouth daily.     ATORVASTATIN (LIPITOR) 40 MG TABLET    TAKE 1 TABLET BY MOUTH EVERYDAY AT BEDTIME   CHOLECALCIFEROL (VITAMIN D) 1000 UNITS TABLET    Take 1,000 Units by mouth daily.    ENALAPRIL (VASOTEC) 5 MG TABLET    TAKE 1 TABLET BY MOUTH EVERY DAY   GLUCOSE BLOOD TEST STRIP    Freestyle Lite Test Strip: Use to check blood sugar 2-3 x weekly Dx: E11.9   METFORMIN  (GLUCOPHAGE) 500 MG TABLET    TAKE 1 TABLET BY MOUTH TWICE DAILY WITH A MEAL- NEEDS APPT FOR ADDITIONAL REFILL  Modified Medications   No medications on file  Discontinued Medications   No medications on file     Physical Exam:    Labs reviewed: Basic Metabolic Panel: Recent Labs    01/23/18 0810 05/30/18 1216  NA 136 136  K 4.6 4.6  CL 103 101  CO2 24 24  GLUCOSE 122* 117  BUN 17 14  CREATININE 1.10 1.03  CALCIUM 9.4 9.5   Liver Function Tests: Recent Labs    01/23/18 0810 05/30/18 1216  AST 20 23  ALT 26 32  BILITOT 0.7 0.7  PROT 6.7 6.9   No results for input(s): LIPASE, AMYLASE in the last 8760 hours. No results for input(s): AMMONIA in the last 8760 hours. CBC: Recent Labs    01/23/18 0810 05/30/18 1216  WBC 4.0 4.3  NEUTROABS 1,812 1,987  HGB 13.8 14.3  HCT 40.6 41.3  MCV 80.1 80.2  PLT 226 223   Lipid Panel: Recent Labs    01/23/18 0810 05/30/18 1216  CHOL 121 133  HDL 28* 30*  LDLCALC 71 72  TRIG 139 224*  CHOLHDL 4.3 4.4   TSH: No results for input(s): TSH in the last 8760 hours. A1C: Lab Results  Component Value Date   HGBA1C 7.7 (H) 05/30/2018     Assessment/Plan 1. Hyperlipidemia, unspecified hyperlipidemia type LDL goal <70, continues on lipitor 40. Last LDL 72. Continue current medication. Will follow up lipid.  - Lipid Panel; Future - COMPLETE METABOLIC PANEL WITH GFR; Future  2. Essential hypertension Controlled on enalapril, continue dietary changes with enalapril   3. Type 2 diabetes mellitus without complication, unspecified whether long term insulin use (HCC) -continues on metformin 500 mg by mouth twice daily with dietary modifications. Blood sugars generally around 120 fasting but up to 140 at times. Blood sugars before bed ranging from 90s-120. Encouraged dietary compliance, routine foot care/monitoring and to keep up with diabetic eye exams through ophthalmology  - Hemoglobin A1c; Future  4. Class 2 severe  obesity due to excess calories with serious comorbidity and body mass index (BMI) of 37.0 to 37.9 in adult [facility]) Working to lose weight with increase in activity and counting calories.   5. Gastroesophageal reflux disease, esophagitis presence not specified No longer an issue, continue with diet modifications.   Next appt: 12/15/2018  Carlos [employer]. Harle Battiest  [facility] [phone]   Virtual Visit via Telephone Note  I connected with Raymond [name] on 12/12/18 at 11:30 AM EDT by telephone and verified that I am speaking with the correct person using two identifiers.  Location: Patient: work Provider:  office   I discussed the limitations, risks, security and privacy concerns of performing an evaluation and management service by telephone and the availability of in person appointments. I also discussed with the patient that there may be a patient responsible charge related to this service. The patient expressed understanding and agreed to proceed.    I discussed the assessment and treatment plan with the patient. The patient was provided an opportunity to ask questions and all were answered. The patient agreed with the plan and demonstrated an understanding of the instructions.   The patient was advised to call back or seek an in-person evaluation if the symptoms worsen or if the condition fails to improve as anticipated.  I provided 18 minutes of non-face-to-face time during this encounter.   [name], NP

## 2018-12-15 ENCOUNTER — Ambulatory Visit (INDEPENDENT_AMBULATORY_CARE_PROVIDER_SITE_OTHER): Payer: Medicare Other | Admitting: Nurse Practitioner

## 2018-12-15 ENCOUNTER — Encounter: Payer: Self-pay | Admitting: Nurse Practitioner

## 2018-12-15 ENCOUNTER — Other Ambulatory Visit: Payer: Self-pay

## 2018-12-15 ENCOUNTER — Other Ambulatory Visit: Payer: Medicare Other

## 2018-12-15 DIAGNOSIS — E119 Type 2 diabetes mellitus without complications: Secondary | ICD-10-CM | POA: Diagnosis not present

## 2018-12-15 DIAGNOSIS — Z0000 Encounter for general adult medical examination without abnormal findings: Secondary | ICD-10-CM

## 2018-12-15 DIAGNOSIS — E785 Hyperlipidemia, unspecified: Secondary | ICD-10-CM | POA: Diagnosis not present

## 2018-12-15 NOTE — Patient Instructions (Addendum)
[name] , Thank you for taking time to come for your Medicare Wellness Visit. I appreciate your ongoing commitment to your health goals. Please review the following plan we discussed and let me know if I can assist you in the future.   Screening recommendations/referrals: Colonoscopy : due 02/10/2019 Recommended yearly ophthalmology/optometry visit for glaucoma screening and checkup Recommended yearly dental visit for hygiene and checkup  Vaccinations: Influenza vaccine due 03/2019 Pneumococcal vaccine up to date Tdap vaccine up to date Shingles vaccine due for second dose of shingrex - should be at pharmacy.  Advanced directives: please complete and bring back to office   Conditions/risks identified: complications due to weight.   Next appointment: 1 year  Preventive Care 67 Years and Older, Male Preventive care refers to lifestyle choices and visits with your health care provider that can promote health and wellness. What does preventive care include?  A yearly physical exam. This is also called an annual well check.  Dental exams once or twice a year.  Routine eye exams. Ask your health care provider how often you should have your eyes checked.  Personal lifestyle choices, including:  Daily care of your teeth and gums.  Regular physical activity.  Eating a healthy diet.  Avoiding tobacco and drug use.  Limiting alcohol use.  Practicing safe sex.  Taking low doses of aspirin every day.  Taking vitamin and mineral supplements as recommended by your health care provider. What happens during an annual well check? The services and screenings done by your health care provider during your annual well check will depend on your age, overall health, lifestyle risk factors, and family history of disease. Counseling  Your health care provider may ask you questions about your:  Alcohol use.  Tobacco use.  Drug use.  Emotional well-being.  Home and relationship  well-being.  Sexual activity.  Eating habits.  History of falls.  Memory and ability to understand (cognition).  Work and work Statistician. Screening  You may have the following tests or measurements:  Height, weight, and BMI.  Blood pressure.  Lipid and cholesterol levels. These may be checked every 5 years, or more frequently if you are over 51 years old.  Skin check.  Lung cancer screening. You may have this screening every year starting at age 33 if you have a 30-pack-year history of smoking and currently smoke or have quit within the past 15 years.  Fecal occult blood test (FOBT) of the stool. You may have this test every year starting at age 14.  Flexible sigmoidoscopy or colonoscopy. You may have a sigmoidoscopy every 5 years or a colonoscopy every 10 years starting at age 48.  Prostate cancer screening. Recommendations will vary depending on your family history and other risks.  Hepatitis C blood test.  Hepatitis B blood test.  Sexually transmitted disease (STD) testing.  Diabetes screening. This is done by checking your blood sugar (glucose) after you have not eaten for a while (fasting). You may have this done every 1-3 years.  Abdominal aortic aneurysm (AAA) screening. You may need this if you are a current or former smoker.  Osteoporosis. You may be screened starting at age 38 if you are at high risk. Talk with your health care provider about your test results, treatment options, and if necessary, the need for more tests. Vaccines  Your health care provider may recommend certain vaccines, such as:  Influenza vaccine. This is recommended every year.  Tetanus, diphtheria, and acellular pertussis (Tdap, Td) vaccine.  You may need a Td booster every 10 years.  Zoster vaccine. You may need this after age 85.  Pneumococcal 13-valent conjugate (PCV13) vaccine. One dose is recommended after age 75.  Pneumococcal polysaccharide (PPSV23) vaccine. One dose is  recommended after age 44. Talk to your health care provider about which screenings and vaccines you need and how often you need them. This information is not intended to replace advice given to you by your health care provider. Make sure you discuss any questions you have with your health care provider. Document Released: 08/19/2015 Document Revised: 04/11/2016 Document Reviewed: 05/24/2015 Elsevier Interactive Patient Education  2017 [place] Prevention in the Home Falls can cause injuries. They can happen to people of all ages. There are many things you can do to make your home safe and to help prevent falls. What can I do on the outside of my home?  Regularly fix the edges of walkways and driveways and fix any cracks.  Remove anything that might make you trip as you walk through a door, such as a raised step or threshold.  Trim any bushes or trees on the path to your home.  Use bright outdoor lighting.  Clear any walking paths of anything that might make someone trip, such as rocks or tools.  Regularly check to see if handrails are loose or broken. Make sure that both sides of any steps have handrails.  Any raised decks and porches should have guardrails on the edges.  Have any leaves, snow, or ice cleared regularly.  Use sand or salt on walking paths during winter.  Clean up any spills in your garage right away. This includes oil or grease spills. What can I do in the bathroom?  Use night lights.  Install grab bars by the toilet and in the tub and shower. Do not use towel bars as grab bars.  Use non-skid mats or decals in the tub or shower.  If you need to sit down in the shower, use a plastic, non-slip stool.  Keep the floor dry. Clean up any water that spills on the floor as soon as it happens.  Remove soap buildup in the tub or shower regularly.  Attach bath mats securely with double-sided non-slip rug tape.  Do not have throw rugs and other things on  the floor that can make you trip. What can I do in the bedroom?  Use night lights.  Make sure that you have a light by your bed that is easy to reach.  Do not use any sheets or blankets that are too big for your bed. They should not hang down onto the floor.  Have a firm chair that has side arms. You can use this for support while you get dressed.  Do not have throw rugs and other things on the floor that can make you trip. What can I do in the kitchen?  Clean up any spills right away.  Avoid walking on wet floors.  Keep items that you use a lot in easy-to-reach places.  If you need to reach something above you, use a strong step stool that has a grab bar.  Keep electrical cords out of the way.  Do not use floor polish or wax that makes floors slippery. If you must use wax, use non-skid floor wax.  Do not have throw rugs and other things on the floor that can make you trip. What can I do with my stairs?  Do not leave any  items on the stairs.  Make sure that there are handrails on both sides of the stairs and use them. Fix handrails that are broken or loose. Make sure that handrails are as long as the stairways.  Check any carpeting to make sure that it is firmly attached to the stairs. Fix any carpet that is loose or worn.  Avoid having throw rugs at the top or bottom of the stairs. If you do have throw rugs, attach them to the floor with carpet tape.  Make sure that you have a light switch at the top of the stairs and the bottom of the stairs. If you do not have them, ask someone to add them for you. What else can I do to help prevent falls?  Wear shoes that:  Do not have high heels.  Have rubber bottoms.  Are comfortable and fit you well.  Are closed at the toe. Do not wear sandals.  If you use a stepladder:  Make sure that it is fully opened. Do not climb a closed stepladder.  Make sure that both sides of the stepladder are locked into place.  Ask someone to  hold it for you, if possible.  Clearly mark and make sure that you can see:  Any grab bars or handrails.  First and last steps.  Where the edge of each step is.  Use tools that help you move around (mobility aids) if they are needed. These include:  Canes.  Walkers.  Scooters.  Crutches.  Turn on the lights when you go into a dark area. Replace any light bulbs as soon as they burn out.  Set up your furniture so you have a clear path. Avoid moving your furniture around.  If any of your floors are uneven, fix them.  If there are any pets around you, be aware of where they are.  Review your medicines with your doctor. Some medicines can make you feel dizzy. This can increase your chance of falling. Ask your doctor what other things that you can do to help prevent falls. This information is not intended to replace advice given to you by your health care provider. Make sure you discuss any questions you have with your health care provider. Document Released: 05/19/2009 Document Revised: 12/29/2015 Document Reviewed: 08/27/2014 Elsevier Interactive Patient Education  2017 [employer].

## 2018-12-15 NOTE — Progress Notes (Deleted)
Subjective:   Raymond [name] is a 68 y.o. male who presents for a Welcome to Medicare exam.   Review of Systems:  Cardiac Risk Factors include: advanced age (>43men, >51 women);diabetes mellitus;dyslipidemia;hypertension;male gender;obesity (BMI >30kg/m2)     Objective:    There were no vitals filed for this visit. There is no height or weight on file to calculate BMI.  Medications Outpatient Encounter Medications as of 12/15/2018  Medication Sig  . aspirin 81 MG tablet Take 81 mg by mouth daily.    Marland Kitchen atorvastatin (LIPITOR) 40 MG tablet TAKE 1 TABLET BY MOUTH EVERYDAY AT BEDTIME  . cholecalciferol (VITAMIN D) 1000 UNITS tablet Take 1,000 Units by mouth daily.   . enalapril (VASOTEC) 5 MG tablet TAKE 1 TABLET BY MOUTH EVERY DAY  . glucose blood test strip Freestyle Lite Test Strip: Use to check blood sugar 2-3 x weekly Dx: E11.9  . metFORMIN (GLUCOPHAGE) 500 MG tablet TAKE 1 TABLET BY MOUTH TWICE DAILY WITH A MEAL- NEEDS APPT FOR ADDITIONAL REFILL   No facility-administered encounter medications on file as of 12/15/2018.      History: Past Medical History:  Diagnosis Date  . Diabetes mellitus without complication ([place])   . Edema   . GERD (gastroesophageal reflux disease)   . Hyperlipidemia   . Hypertension   . Obesity, unspecified   . Problems with sexual function   . Sleep apnea    released per MD, no CPAP  . Unspecified sleep apnea   . Unspecified vitamin D deficiency    Past Surgical History:  Procedure Laterality Date  . CATARACT EXTRACTION  1964   Dr.Freed   . COLONOSCOPY    . HERNIA REPAIR  1987   Right Side, Dr.Kaufman     Family History  Problem Relation Age of Onset  . Colon cancer Mother   . Esophageal cancer Neg Hx   . Stomach cancer Neg Hx   . Rectal cancer Neg Hx    Social History   Occupational History  . Not on file  Tobacco Use  . Smoking status: Never Smoker  . Smokeless tobacco: Never Used  Substance and Sexual Activity  . Alcohol  use: No  . Drug use: No  . Sexual activity: Yes   Tobacco Counseling Counseling given: Not Answered   Immunizations and Health Maintenance Immunization History  Administered Date(s) Administered  . Influenza, High Dose Seasonal PF 08/26/2017  . Influenza,inj,Quad PF,6+ Mos 04/01/2013, 07/12/2014, 05/26/2015, 04/17/2016, 05/30/2018  . Pneumococcal Conjugate-13 07/19/2016  . Pneumococcal Polysaccharide-23 09/20/2011, 01/27/2018  . Td 08/06/2002  . Tdap 12/01/2013  . Zoster 09/16/2012   Health Maintenance Due  Topic Date Due  . HEMOGLOBIN A1C  11/29/2018    Activities of Daily Living In your present state of health, do you have any difficulty performing the following activities: 12/15/2018  Hearing? N  Vision? N  Difficulty concentrating or making decisions? N  Walking or climbing stairs? N  Dressing or bathing? N  Doing errands, shopping? N  Preparing Food and eating ? N  Using the Toilet? N  In the past six months, have you accidently leaked urine? N  Do you have problems with loss of bowel control? N  Managing your Medications? N  Managing your Finances? N  Housekeeping or managing your Housekeeping? N  Some recent data might be hidden      Advanced Directives: Does Patient Have a Medical Advance Directive?: No Would patient like information on creating a medical advance directive?: Yes (  MAU/Ambulatory/Procedural Areas - Information given)    Assessment:    This is a routine wellness  examination for this patient .   Vision/Hearing screen Vision Screening Comments: Last eye exam 04/2018  Dietary issues and exercise activities discussed:  Current Exercise Habits: Home exercise routine;Structured exercise class, Type of exercise: walking, Time (Minutes): 30, Frequency (Times/Week): 5, Weekly Exercise (Minutes/Week): 150, Intensity: Moderate  Goals    . Weight (lb) < 225 lb (102.1 kg)       Depression Screen PHQ 2/9 Scores 12/15/2018 12/12/2018 05/30/2018  01/27/2018  PHQ - 2 Score 0 0 0 0     Fall Risk Fall Risk  12/15/2018  Falls in the past year? 0  Number falls in past yr: 0  Injury with Fall? 0    Cognitive Function MMSE - Mini Mental State Exam 01/27/2018  Orientation to time 5  Orientation to Place 5  Registration 3  Attention/ Calculation 5  Recall 3  Language- name 2 objects 2  Language- repeat 1  Language- follow 3 step command 3  Language- read & follow direction 1  Write a sentence 1  Copy design 1  Total score 30     6CIT Screen 12/15/2018  What Year? 0 points  What month? 0 points  What time? 0 points  Count back from 20 0 points  Months in reverse 0 points  Repeat phrase 0 points  Total Score 0    Patient Care Team: [name], NP as PCP - General (Nurse Practitioner) [name], MD as Consulting Physician (Ophthalmology)     Plan:     I have personally reviewed and noted the following in the patient's chart:   . Medical and social history . Use of alcohol, tobacco or illicit drugs  . Current medications and supplements Functional ability and status . Nutritional status . Physical activity . Advanced directives . List of other physicians . Hospitalizations, surgeries, and ER visits in previous 12 months . Vitals . Screenings to include cognitive, depression, and falls . Referrals and appointments  In addition, I have reviewed and discussed with patient certain preventive protocols, quality metrics, and best practice recommendations. A written personalized care plan for preventive services as well as general preventive health recommendations were provided to patient.    [name], NP 12/15/2018       Subjective:   Raymond [name] is a 68 y.o. male who presents for a Welcome to Medicare exam.   Review of Systems: *** Cardiac Risk Factors include: advanced age (>33men, >19 women);diabetes mellitus;dyslipidemia;hypertension;male gender;obesity (BMI >30kg/m2)      Objective:    There were no vitals filed for this visit. There is no height or weight on file to calculate BMI.  Medications Outpatient Encounter Medications as of 12/15/2018  Medication Sig  . aspirin 81 MG tablet Take 81 mg by mouth daily.    Marland Kitchen atorvastatin (LIPITOR) 40 MG tablet TAKE 1 TABLET BY MOUTH EVERYDAY AT BEDTIME  . cholecalciferol (VITAMIN D) 1000 UNITS tablet Take 1,000 Units by mouth daily.   . enalapril (VASOTEC) 5 MG tablet TAKE 1 TABLET BY MOUTH EVERY DAY  . glucose blood test strip Freestyle Lite Test Strip: Use to check blood sugar 2-3 x weekly Dx: E11.9  . metFORMIN (GLUCOPHAGE) 500 MG tablet TAKE 1 TABLET BY MOUTH TWICE DAILY WITH A MEAL- NEEDS APPT FOR ADDITIONAL REFILL   No facility-administered encounter medications on file as of 12/15/2018.      History: Past  Medical History:  Diagnosis Date  . Diabetes mellitus without complication ([place])   . Edema   . GERD (gastroesophageal reflux disease)   . Hyperlipidemia   . Hypertension   . Obesity, unspecified   . Problems with sexual function   . Sleep apnea    released per MD, no CPAP  . Unspecified sleep apnea   . Unspecified vitamin D deficiency    Past Surgical History:  Procedure Laterality Date  . CATARACT EXTRACTION  1964   Dr.Freed   . COLONOSCOPY    . HERNIA REPAIR  1987   Right Side, Dr.Kaufman     Family History  Problem Relation Age of Onset  . Colon cancer Mother   . Esophageal cancer Neg Hx   . Stomach cancer Neg Hx   . Rectal cancer Neg Hx    Social History   Occupational History  . Not on file  Tobacco Use  . Smoking status: Never Smoker  . Smokeless tobacco: Never Used  Substance and Sexual Activity  . Alcohol use: No  . Drug use: No  . Sexual activity: Yes   Tobacco Counseling Counseling given: Not Answered   Immunizations and Health Maintenance Immunization History  Administered Date(s) Administered  . Influenza, High Dose Seasonal PF 08/26/2017  .  Influenza,inj,Quad PF,6+ Mos 04/01/2013, 07/12/2014, 05/26/2015, 04/17/2016, 05/30/2018  . Pneumococcal Conjugate-13 07/19/2016  . Pneumococcal Polysaccharide-23 09/20/2011, 01/27/2018  . Td 08/06/2002  . Tdap 12/01/2013  . Zoster 09/16/2012   Health Maintenance Due  Topic Date Due  . HEMOGLOBIN A1C  11/29/2018    Activities of Daily Living In your present state of health, do you have any difficulty performing the following activities: 12/15/2018  Hearing? N  Vision? N  Difficulty concentrating or making decisions? N  Walking or climbing stairs? N  Dressing or bathing? N  Doing errands, shopping? N  Preparing Food and eating ? N  Using the Toilet? N  In the past six months, have you accidently leaked urine? N  Do you have problems with loss of bowel control? N  Managing your Medications? N  Managing your Finances? N  Housekeeping or managing your Housekeeping? N  Some recent data might be hidden    Physical Exam  ***(optional), or other factors deemed appropriate based on the beneficiary's medical and social history and current clinical standards.  Advanced Directives: Does Patient Have a Medical Advance Directive?: No Would patient like information on creating a medical advance directive?: Yes (MAU/Ambulatory/Procedural Areas - Information given)    Assessment:    This is a routine wellness  examination for this patient . ***  Vision/Hearing screen Vision Screening Comments: Last eye exam 04/2018  Dietary issues and exercise activities discussed:  Current Exercise Habits: Home exercise routine;Structured exercise class, Type of exercise: walking, Time (Minutes): 30, Frequency (Times/Week): 5, Weekly Exercise (Minutes/Week): 150, Intensity: Moderate  Goals    . Weight (lb) < 225 lb (102.1 kg)       Depression Screen PHQ 2/9 Scores 12/15/2018 12/12/2018 05/30/2018 01/27/2018  PHQ - 2 Score 0 0 0 0     Fall Risk Fall Risk  12/15/2018  Falls in the past year? 0   Number falls in past yr: 0  Injury with Fall? 0    Cognitive Function MMSE - Mini Mental State Exam 01/27/2018  Orientation to time 5  Orientation to Place 5  Registration 3  Attention/ Calculation 5  Recall 3  Language- name 2 objects 2  Language- repeat 1  Language- follow 3 step command 3  Language- read & follow direction 1  Write a sentence 1  Copy design 1  Total score 30     6CIT Screen 12/15/2018  What Year? 0 points  What month? 0 points  What time? 0 points  Count back from 20 0 points  Months in reverse 0 points  Repeat phrase 0 points  Total Score 0    Patient Care Team: [name], NP as PCP - General (Nurse Practitioner) [name], MD as Consulting Physician (Ophthalmology)     Plan:   ***  I have personally reviewed and noted the following in the patient's chart:   . Medical and social history . Use of alcohol, tobacco or illicit drugs  . Current medications and supplements . Functional ability and status . Nutritional status . Physical activity . Advanced directives . List of other physicians . Hospitalizations, surgeries, and ER visits in previous 12 months . Vitals . Screenings to include cognitive, depression, and falls . Referrals and appointments  In addition, I have reviewed and discussed with patient certain preventive protocols, quality metrics, and best practice recommendations. A written personalized care plan for preventive services as well as general preventive health recommendations were provided to patient.    [name], NP 12/15/2018

## 2018-12-15 NOTE — Progress Notes (Signed)
   This service is provided via telemedicine  No vital signs collected/recorded due to the encounter was a telemedicine visit.   Location of patient (ex: home, work): Work  Patient consents to a telephone visit:  Yes  Location of the provider (ex: office, home):  [employer], Office   Name of any referring provider: N/A  Names of all persons participating in the telemedicine service and their role in the encounter:  S.Chrae B/CMA, Sherrie Mustache, NP, and Patient   Time spent on call:  10 min

## 2018-12-16 ENCOUNTER — Encounter: Payer: Self-pay | Admitting: Nurse Practitioner

## 2018-12-16 ENCOUNTER — Other Ambulatory Visit: Payer: Self-pay | Admitting: Nurse Practitioner

## 2018-12-16 DIAGNOSIS — E119 Type 2 diabetes mellitus without complications: Secondary | ICD-10-CM

## 2018-12-16 LAB — COMPLETE METABOLIC PANEL WITH GFR
AG Ratio: 2 (calc) (ref 1.0–2.5)
ALT: 43 U/L (ref 9–46)
AST: 27 U/L (ref 10–35)
Albumin: 4.5 g/dL (ref 3.6–5.1)
Alkaline phosphatase (APISO): 57 U/L (ref 35–144)
BUN: 17 mg/dL (ref 7–25)
CO2: 24 mmol/L (ref 20–32)
Calcium: 9.2 mg/dL (ref 8.6–10.3)
Chloride: 104 mmol/L (ref 98–110)
Creat: 0.94 mg/dL (ref 0.70–1.25)
GFR, Est African American: 98 mL/min/{1.73_m2} (ref >=60)
GFR, Est Non African American: 84 mL/min/{1.73_m2} (ref >=60)
Globulin: 2.2 g/dL (calc) (ref 1.9–3.7)
Glucose, Bld: 124 mg/dL — ABNORMAL HIGH (ref 65–99)
Potassium: 4.4 mmol/L (ref 3.5–5.3)
Sodium: 137 mmol/L (ref 135–146)
Total Bilirubin: 0.7 mg/dL (ref 0.2–1.2)
Total Protein: 6.7 g/dL (ref 6.1–8.1)

## 2018-12-16 LAB — LIPID PANEL
Cholesterol: 108 mg/dL (ref <200)
HDL: 28 mg/dL — ABNORMAL LOW (ref >=40)
LDL Cholesterol (Calc): 57 mg/dL (calc)
Non-HDL Cholesterol (Calc): 80 mg/dL (calc) (ref <130)
Total CHOL/HDL Ratio: 3.9 (calc) (ref <5.0)
Triglycerides: 143 mg/dL (ref <150)

## 2018-12-16 LAB — HEMOGLOBIN A1C
Hgb A1c MFr Bld: 8.2 % of total Hgb — ABNORMAL HIGH (ref <5.7)
Mean Plasma Glucose: 189 (calc)
eAG (mmol/L): 10.4 (calc)

## 2018-12-16 MED ORDER — METFORMIN HCL 1000 MG PO TABS: 1000.0000 mg | ORAL_TABLET | Freq: Two times a day (BID) | ORAL | 1 refills | Status: DC

## 2018-12-18 NOTE — Addendum Note (Signed)
Addended by: [name] on: 12/18/2018 01:32 PM   Modules accepted: Miquel Dunn

## 2018-12-18 NOTE — Progress Notes (Signed)
Subjective:   Raymond [name] is a 68 y.o. male who presents for a Welcome to Medicare exam.   Review of Systems:  Cardiac Risk Factors include: advanced age (>48men, >49 women);diabetes mellitus;dyslipidemia;hypertension;male gender;obesity (BMI >30kg/m2)     Objective:    There were no vitals filed for this visit. There is no height or weight on file to calculate BMI.  Medications Outpatient Encounter Medications as of 12/15/2018  Medication Sig  . aspirin 81 MG tablet Take 81 mg by mouth daily.    Marland Kitchen atorvastatin (LIPITOR) 40 MG tablet TAKE 1 TABLET BY MOUTH EVERYDAY AT BEDTIME  . cholecalciferol (VITAMIN D) 1000 UNITS tablet Take 1,000 Units by mouth daily.   . enalapril (VASOTEC) 5 MG tablet TAKE 1 TABLET BY MOUTH EVERY DAY  . glucose blood test strip Freestyle Lite Test Strip: Use to check blood sugar 2-3 x weekly Dx: E11.9  . [DISCONTINUED] metFORMIN (GLUCOPHAGE) 500 MG tablet TAKE 1 TABLET BY MOUTH TWICE DAILY WITH A MEAL- NEEDS APPT FOR ADDITIONAL REFILL   No facility-administered encounter medications on file as of 12/15/2018.      History: Past Medical History:  Diagnosis Date  . Diabetes mellitus without complication ([place])   . Edema   . GERD (gastroesophageal reflux disease)   . Hyperlipidemia   . Hypertension   . Obesity, unspecified   . Problems with sexual function   . Sleep apnea    released per MD, no CPAP  . Unspecified sleep apnea   . Unspecified vitamin D deficiency    Past Surgical History:  Procedure Laterality Date  . CATARACT EXTRACTION  1964   Dr.Freed   . COLONOSCOPY    . HERNIA REPAIR  1987   Right Side, Dr.Kaufman     Family History  Problem Relation Age of Onset  . Colon cancer Mother   . Esophageal cancer Neg Hx   . Stomach cancer Neg Hx   . Rectal cancer Neg Hx    Social History   Occupational History  . Not on file  Tobacco Use  . Smoking status: Never Smoker  . Smokeless tobacco: Never Used  Substance and Sexual Activity   . Alcohol use: No  . Drug use: No  . Sexual activity: Yes   Tobacco Counseling Counseling given: Not Answered   Immunizations and Health Maintenance Immunization History  Administered Date(s) Administered  . Influenza, High Dose Seasonal PF 08/26/2017  . Influenza,inj,Quad PF,6+ Mos 04/01/2013, 07/12/2014, 05/26/2015, 04/17/2016, 05/30/2018  . Pneumococcal Conjugate-13 07/19/2016  . Pneumococcal Polysaccharide-23 09/20/2011, 01/27/2018  . Td 08/06/2002  . Tdap 12/01/2013  . Zoster 09/16/2012   There are no preventive care reminders to display for this patient.  Activities of Daily Living In your present state of health, do you have any difficulty performing the following activities: 12/15/2018  Hearing? N  Vision? N  Difficulty concentrating or making decisions? N  Walking or climbing stairs? N  Dressing or bathing? N  Doing errands, shopping? N  Preparing Food and eating ? N  Using the Toilet? N  In the past six months, have you accidently leaked urine? N  Do you have problems with loss of bowel control? N  Managing your Medications? N  Managing your Finances? N  Housekeeping or managing your Housekeeping? N  Some recent data might be hidden    Physical Exam unable due to televisit Advanced Directives: Does Patient Have a Medical Advance Directive?: No Would patient like information on creating a medical advance directive?:  Yes (MAU/Ambulatory/Procedural Areas - Information given)    Assessment:    This is a routine wellness  examination for this patient .   Vision/Hearing screen Vision Screening Comments: Last eye exam 04/2018  Dietary issues and exercise activities discussed:  Current Exercise Habits: Home exercise routine;Structured exercise class, Type of exercise: walking, Time (Minutes): 30, Frequency (Times/Week): 5, Weekly Exercise (Minutes/Week): 150, Intensity: Moderate  Goals    . Weight (lb) < 225 lb (102.1 kg)       Depression Screen PHQ 2/9  Scores 12/15/2018 12/12/2018 05/30/2018 01/27/2018  PHQ - 2 Score 0 0 0 0     Fall Risk Fall Risk  12/15/2018  Falls in the past year? 0  Number falls in past yr: 0  Injury with Fall? 0    Cognitive Function MMSE - Mini Mental State Exam 01/27/2018  Orientation to time 5  Orientation to Place 5  Registration 3  Attention/ Calculation 5  Recall 3  Language- name 2 objects 2  Language- repeat 1  Language- follow 3 step command 3  Language- read & follow direction 1  Write a sentence 1  Copy design 1  Total score 30     6CIT Screen 12/15/2018  What Year? 0 points  What month? 0 points  What time? 0 points  Count back from 20 0 points  Months in reverse 0 points  Repeat phrase 0 points  Total Score 0    Patient Care Team: [name], NP as PCP - General (Nurse Practitioner) [name], MD as Consulting Physician (Ophthalmology)     Plan:     I have personally reviewed and noted the following in the patient's chart:   . Medical and social history . Use of alcohol, tobacco or illicit drugs  . Current medications and supplements . Functional ability and status . Nutritional status . Physical activity . Advanced directives . List of other physicians . Hospitalizations, surgeries, and ER visits in previous 12 months . Vitals . Screenings to include cognitive, depression, and falls . Referrals and appointments  In addition, I have reviewed and discussed with patient certain preventive protocols, quality metrics, and best practice recommendations. A written personalized care plan for preventive services as well as general preventive health recommendations were provided to patient.    [name], NP 12/18/2018

## 2018-12-29 ENCOUNTER — Other Ambulatory Visit: Payer: Self-pay | Admitting: Nurse Practitioner

## 2018-12-30 NOTE — Telephone Encounter (Signed)
RX request was for 500 mg tablet and [name] recently changed instructions (see recent labs). New rx ws sent in on 12/16/2018

## 2019-01-02 ENCOUNTER — Ambulatory Visit: Payer: Medicare Other | Admitting: Nurse Practitioner

## 2019-01-13 ENCOUNTER — Other Ambulatory Visit: Payer: Self-pay | Admitting: Nurse Practitioner

## 2019-02-26 ENCOUNTER — Encounter: Payer: Self-pay | Admitting: Gastroenterology

## 2019-04-10 ENCOUNTER — Other Ambulatory Visit: Payer: Self-pay

## 2019-04-10 NOTE — Patient Outreach (Signed)
[facility]) Care Management  04/10/2019  YANIS SHAMBLEN February 15, 1951 [id number]   Medication Adherence call to Mr. Alijha Rummel HIPPA Compliant Voice message left with a call back number. Mr. Janelle is showing past due on Atorvastatin 40 mg under [facility].   [facility] Management Direct Dial [phone]  Fax [phone] [email or web address]

## 2019-06-11 ENCOUNTER — Other Ambulatory Visit: Payer: Self-pay | Admitting: Nurse Practitioner

## 2019-06-15 ENCOUNTER — Ambulatory Visit: Payer: Medicare Other | Admitting: Nurse Practitioner

## 2019-06-15 ENCOUNTER — Encounter: Payer: Self-pay | Admitting: Gastroenterology

## 2019-06-18 ENCOUNTER — Other Ambulatory Visit: Payer: Medicare Other

## 2019-06-18 ENCOUNTER — Other Ambulatory Visit: Payer: Self-pay

## 2019-06-18 DIAGNOSIS — E119 Type 2 diabetes mellitus without complications: Secondary | ICD-10-CM

## 2019-06-19 LAB — HEMOGLOBIN A1C
Hgb A1c MFr Bld: 8.1 % of total Hgb — ABNORMAL HIGH (ref <5.7)
Mean Plasma Glucose: 186 (calc)
eAG (mmol/L): 10.3 (calc)

## 2019-06-22 ENCOUNTER — Ambulatory Visit (INDEPENDENT_AMBULATORY_CARE_PROVIDER_SITE_OTHER): Payer: Medicare Other | Admitting: Nurse Practitioner

## 2019-06-22 ENCOUNTER — Other Ambulatory Visit: Payer: Self-pay

## 2019-06-22 ENCOUNTER — Encounter: Payer: Self-pay | Admitting: Nurse Practitioner

## 2019-06-22 VITALS — BP 128/90 | HR 71 | Temp 97.3°F | Ht 72.0 in | Wt 271.8 lb

## 2019-06-22 DIAGNOSIS — E559 Vitamin D deficiency, unspecified: Secondary | ICD-10-CM

## 2019-06-22 DIAGNOSIS — I10 Essential (primary) hypertension: Secondary | ICD-10-CM

## 2019-06-22 DIAGNOSIS — E119 Type 2 diabetes mellitus without complications: Secondary | ICD-10-CM

## 2019-06-22 DIAGNOSIS — Z23 Encounter for immunization: Secondary | ICD-10-CM | POA: Diagnosis not present

## 2019-06-22 DIAGNOSIS — L602 Onychogryphosis: Secondary | ICD-10-CM

## 2019-06-22 DIAGNOSIS — L989 Disorder of the skin and subcutaneous tissue, unspecified: Secondary | ICD-10-CM

## 2019-06-22 DIAGNOSIS — K219 Gastro-esophageal reflux disease without esophagitis: Secondary | ICD-10-CM | POA: Diagnosis not present

## 2019-06-22 DIAGNOSIS — R350 Frequency of micturition: Secondary | ICD-10-CM

## 2019-06-22 DIAGNOSIS — Z6836 Body mass index (BMI) 36.0-36.9, adult: Secondary | ICD-10-CM

## 2019-06-22 DIAGNOSIS — E785 Hyperlipidemia, unspecified: Secondary | ICD-10-CM

## 2019-06-22 LAB — CBC WITH DIFFERENTIAL/PLATELET
Absolute Monocytes: 576 cells/uL (ref 200–950)
Basophils Absolute: 19 cells/uL (ref 0–200)
Basophils Relative: 0.4 %
Eosinophils Absolute: 269 cells/uL (ref 15–500)
Eosinophils Relative: 5.6 %
HCT: 44.4 % (ref 38.5–50.0)
Hemoglobin: 14.6 g/dL (ref 13.2–17.1)
Lymphs Abs: 1675 cells/uL (ref 850–3900)
MCH: 26.8 pg — ABNORMAL LOW (ref 27.0–33.0)
MCHC: 32.9 g/dL (ref 32.0–36.0)
MCV: 81.6 fL (ref 80.0–100.0)
MPV: 10.4 fL (ref 7.5–12.5)
Monocytes Relative: 12 %
Neutro Abs: 2261 cells/uL (ref 1500–7800)
Neutrophils Relative %: 47.1 %
Platelets: 238 10*3/uL (ref 140–400)
RBC: 5.44 10*6/uL (ref 4.20–5.80)
RDW: 13.9 % (ref 11.0–15.0)
Total Lymphocyte: 34.9 %
WBC: 4.8 10*3/uL (ref 3.8–10.8)

## 2019-06-22 LAB — COMPLETE METABOLIC PANEL WITH GFR
AG Ratio: 1.8 (calc) (ref 1.0–2.5)
ALT: 36 U/L (ref 9–46)
AST: 26 U/L (ref 10–35)
Albumin: 4.6 g/dL (ref 3.6–5.1)
Alkaline phosphatase (APISO): 57 U/L (ref 35–144)
BUN: 17 mg/dL (ref 7–25)
CO2: 20 mmol/L (ref 20–32)
Calcium: 9.6 mg/dL (ref 8.6–10.3)
Chloride: 103 mmol/L (ref 98–110)
Creat: 1.02 mg/dL (ref 0.70–1.25)
GFR, Est African American: 88 mL/min/{1.73_m2} (ref >=60)
GFR, Est Non African American: 76 mL/min/{1.73_m2} (ref >=60)
Globulin: 2.6 g/dL (calc) (ref 1.9–3.7)
Glucose, Bld: 145 mg/dL — ABNORMAL HIGH (ref 65–99)
Potassium: 4.3 mmol/L (ref 3.5–5.3)
Sodium: 134 mmol/L — ABNORMAL LOW (ref 135–146)
Total Bilirubin: 0.6 mg/dL (ref 0.2–1.2)
Total Protein: 7.2 g/dL (ref 6.1–8.1)

## 2019-06-22 LAB — PSA: PSA: 3.2 ng/mL (ref <=4.0)

## 2019-06-22 NOTE — Progress Notes (Signed)
Careteam: Patient Care Team: [name], NP as PCP - General (Nurse Practitioner) [name], MD as Consulting Physician (Ophthalmology)  Advanced Directive information Does Patient Have a Medical Advance Directive?: No, Would patient like information on creating a medical advance directive?: Yes (MAU/Ambulatory/Procedural Areas - Information given)  No Known Allergies  Chief Complaint  Patient presents with  . Medical Management of Chronic Issues    6 month follow-up and discuss labs (copy printed)  . Quality Metric Gaps    Foot exam and colonoscopy due   . Immunizations    Flu vaccine today      HPI: Patient is a 68 y.o. male seen in the office today for routine follow up.   DM- A1c remains uncontrolled. Increased metformin to 1000 mg by mouth twice daily. Continues with poor diet. Missed a1c follow up for 3 months. A1c at 8.1 now, 6 months ago was 8.2.   HTN- controlled on enalapril.    Hx of OSA but was taken off CPAP.   GERD- controlled with diet.   Hyperlipidemia- continues on lipitor. Goal LDL <70, at goal in March  Continues with occasional side pain- this is on and off, changed mattress which he feels like is helping. Xray was negative. Contributes this to the fact he always sleep on that side, but now sleeping on back.   Has had spot above right eye brow, has changed in the last year. Been putting a cream on it, crushed and red  White area on right nipple  Has colonoscopy schedule for Jul 21, 2019   Review of Systems:  Review of Systems  Constitutional: Negative for chills, fever and weight loss.  HENT: Negative for tinnitus.   Respiratory: Negative for cough, sputum production and shortness of breath.   Cardiovascular: Negative for chest pain, palpitations and leg swelling.  Gastrointestinal: Negative for abdominal pain, constipation, diarrhea and heartburn.  Genitourinary: Negative for dysuria, frequency and urgency.       Gets up 2  times at night to urinate   Musculoskeletal: Negative for back pain, falls, joint pain and myalgias.  Skin: Negative.        Skin lesion to forehead  Neurological: Negative for dizziness and headaches.  Psychiatric/Behavioral: Negative for depression and memory loss. The patient does not have insomnia.     Past Medical History:  Diagnosis Date  . Diabetes mellitus without complication ([place])   . Edema   . GERD (gastroesophageal reflux disease)   . Hyperlipidemia   . Hypertension   . Obesity, unspecified   . Problems with sexual function   . Sleep apnea    released per MD, no CPAP  . Unspecified sleep apnea   . Unspecified vitamin D deficiency    Past Surgical History:  Procedure Laterality Date  . CATARACT EXTRACTION  1964   Dr.Freed   . COLONOSCOPY    . HERNIA REPAIR  1987   Right Side, Dr.Kaufman    Social History:   reports that he has never smoked. He has never used smokeless tobacco. He reports that he does not drink alcohol or use drugs.  Family History  Problem Relation Age of Onset  . Colon cancer Mother   . Cancer Sister   . Esophageal cancer Neg Hx   . Stomach cancer Neg Hx   . Rectal cancer Neg Hx     Medications: Patient's Medications  New Prescriptions   No medications on file  Previous Medications   ASPIRIN 81 MG  TABLET    Take 81 mg by mouth daily.     ATORVASTATIN (LIPITOR) 40 MG TABLET    TAKE 1 TABLET BY MOUTH EVERYDAY AT BEDTIME   CHOLECALCIFEROL (VITAMIN D) 1000 UNITS TABLET    Take 1,000 Units by mouth daily.    ENALAPRIL (VASOTEC) 5 MG TABLET    TAKE 1 TABLET BY MOUTH EVERY DAY   GLUCOSE BLOOD TEST STRIP    Freestyle Lite Test Strip: Use to check blood sugar 2-3 x weekly Dx: E11.9   METFORMIN (GLUCOPHAGE) 1000 MG TABLET    TAKE 1 TABLET (1,000 MG TOTAL) BY MOUTH 2 (TWO) TIMES DAILY WITH A MEAL.  Modified Medications   No medications on file  Discontinued Medications   No medications on file    Physical Exam:  Vitals:   06/22/19 0906   BP: 128/90  Pulse: 71  Temp: (!) 97.3 F (36.3 C)  TempSrc: Temporal  SpO2: 96%  Weight: 271 lb 12.8 oz (123.3 kg)  Height: 6' (1.829 m)   Body mass index is 36.86 kg/m. Wt Readings from Last 3 Encounters:  06/22/19 271 lb 12.8 oz (123.3 kg)  05/30/18 275 lb (124.7 kg)  01/27/18 257 lb (116.6 kg)    Physical Exam Constitutional:      General: He is not in acute distress.    Appearance: He is well-developed. He is not diaphoretic.  HENT:     Head: Normocephalic and atraumatic.     Mouth/Throat:     Pharynx: No oropharyngeal exudate.  Eyes:     Conjunctiva/sclera: Conjunctivae normal.     Pupils: Pupils are equal, round, and reactive to light.  Neck:     Musculoskeletal: Normal range of motion and neck supple.  Cardiovascular:     Rate and Rhythm: Normal rate and regular rhythm.     Pulses: Normal pulses.          Dorsalis pedis pulses are 2+ on the right side and 2+ on the left side.       Posterior tibial pulses are 2+ on the right side and 2+ on the left side.     Heart sounds: Normal heart sounds.  Pulmonary:     Effort: Pulmonary effort is normal.     Breath sounds: Normal breath sounds.  Abdominal:     General: Bowel sounds are normal.     Palpations: Abdomen is soft.  Musculoskeletal:        General: No tenderness.  Feet:     Right foot:     Protective Sensation: 3 sites tested. 3 sites sensed.     Skin integrity: Skin integrity normal.     Toenail Condition: Right toenails are abnormally thick.     Left foot:     Protective Sensation: 3 sites tested. 3 sites sensed.     Skin integrity: Skin integrity normal.     Toenail Condition: Left toenails are abnormally thick.  Skin:    General: Skin is warm and dry.     Findings: Lesion (scaly small pink lesion noted to forehead above right eye) present.  Neurological:     Mental Status: He is alert and oriented to person, place, and time.     Labs reviewed: Basic Metabolic Panel: Recent Labs    12/15/18  1006  NA 137  K 4.4  CL 104  CO2 24  GLUCOSE 124*  BUN 17  CREATININE 0.94  CALCIUM 9.2   Liver Function Tests: Recent Labs    12/15/18 1006  AST 27  ALT 43  BILITOT 0.7  PROT 6.7   No results for input(s): LIPASE, AMYLASE in the last 8760 hours. No results for input(s): AMMONIA in the last 8760 hours. CBC: No results for input(s): WBC, NEUTROABS, HGB, HCT, MCV, PLT in the last 8760 hours. Lipid Panel: Recent Labs    12/15/18 1006  CHOL 108  HDL 28*  LDLCALC 57  TRIG 143  CHOLHDL 3.9   TSH: No results for input(s): TSH in the last 8760 hours. A1C: Lab Results  Component Value Date   HGBA1C 8.1 (H) 06/18/2019     Assessment/Plan 1. Need for influenza vaccination - Flu Vaccine QUAD High Dose(Fluad)  2. Class 2 severe obesity due to excess calories with serious comorbidity and body mass index (BMI) of 36.0 to 36.9 in adult [facility]) -ongoing, discusses need for weight loss through diet and exercise.   3. Type 2 diabetes mellitus without complication, unspecified whether long term insulin use ([place]) -not at goal. Discussed this with pt, he missed 3 month a1c.  Encouraged dietary compliance, routine foot care/monitoring and to keep up with diabetic eye exams through ophthalmology  -will refer to endocrinologist at this time due to ongoing elevated A1c -- discussed with the patient the pathophysiology of diabetes and the natural progression of the disease.  -stressed the importance of lifestyle changes including diet and exercise. -discussed complications associated with diabetes including retinopathy, nephropathy, neuropathy as well as increased risk of cardiovascular disease. We went over the benefit seen with glycemic control.  -continues on metformin 1000 mg twice daily  - CMP with eGFR([facility]) - CBC with Differential/Platelet - Ambulatory referral to Endocrinology - Amb ref to Medical Nutrition Therapy-MNT  4. Vitamin D deficiency -continues on supplement   5. Gastroesophageal reflux disease, unspecified whether esophagitis present Stable on dietary modifications.  6. Skin lesion -suspicious for AK, discussed with pt - Ambulatory referral to Dermatology   7. Essential hypertension Controlled on enalapril 5 mg daily   8. Hyperlipidemia, unspecified hyperlipidemia type LDL at goal on lipitor, encouraged compliance with dietary modifications.   9. Urinary frequency -ongoing nocturia.  - PSA  10. Thickened nails - Ambulatory referral to Podiatry for trim  Next appt: 6 month for routine follow up [place]. [place], [facility] [phone]

## 2019-06-22 NOTE — Patient Instructions (Signed)

## 2019-07-07 ENCOUNTER — Encounter: Payer: Self-pay | Admitting: Gastroenterology

## 2019-07-07 ENCOUNTER — Ambulatory Visit (AMBULATORY_SURGERY_CENTER): Payer: Medicare Other | Admitting: *Deleted

## 2019-07-07 ENCOUNTER — Other Ambulatory Visit: Payer: Self-pay

## 2019-07-07 VITALS — Temp 96.6°F | Ht 72.0 in | Wt 270.0 lb

## 2019-07-07 DIAGNOSIS — Z86010 Personal history of colonic polyps: Secondary | ICD-10-CM

## 2019-07-07 DIAGNOSIS — Z1159 Encounter for screening for other viral diseases: Secondary | ICD-10-CM

## 2019-07-07 NOTE — Progress Notes (Signed)

## 2019-07-14 DIAGNOSIS — L820 Inflamed seborrheic keratosis: Secondary | ICD-10-CM | POA: Diagnosis not present

## 2019-07-14 DIAGNOSIS — L819 Disorder of pigmentation, unspecified: Secondary | ICD-10-CM | POA: Diagnosis not present

## 2019-07-14 DIAGNOSIS — L814 Other melanin hyperpigmentation: Secondary | ICD-10-CM | POA: Diagnosis not present

## 2019-07-16 ENCOUNTER — Encounter: Payer: Self-pay | Admitting: Registered"

## 2019-07-16 ENCOUNTER — Other Ambulatory Visit: Payer: Self-pay | Admitting: Gastroenterology

## 2019-07-16 ENCOUNTER — Ambulatory Visit (INDEPENDENT_AMBULATORY_CARE_PROVIDER_SITE_OTHER): Payer: Medicare Other

## 2019-07-16 ENCOUNTER — Other Ambulatory Visit: Payer: Self-pay

## 2019-07-16 ENCOUNTER — Encounter: Payer: Medicare Other | Attending: Nurse Practitioner | Admitting: Registered"

## 2019-07-16 DIAGNOSIS — E1165 Type 2 diabetes mellitus with hyperglycemia: Secondary | ICD-10-CM | POA: Diagnosis not present

## 2019-07-16 DIAGNOSIS — Z1159 Encounter for screening for other viral diseases: Secondary | ICD-10-CM

## 2019-07-16 LAB — SARS CORONAVIRUS 2 (TAT 6-24 HRS): SARS Coronavirus 2: NEGATIVE

## 2019-07-16 NOTE — Progress Notes (Signed)
Diabetes Self-Management Education  Visit Type: First/Initial  Appt. Start Time: 1100 Appt. End Time: 1200  07/16/2019  [name], identified by name and date of birth, is a 68 y.o. male with a diagnosis of Diabetes: Type 2.   ASSESSMENT  There were no vitals taken for this visit. There is no height or weight on file to calculate BMI.   Pt states he was keeping his BG under control for many years but over the last year feels he is not doing as well with his diet. Pt states he has lost 5 lbs in last week or so. Pt states he has family history including father who had amputations, and his mother who controlled her T2DM well through diet into her late 67s.  Pt states evening snacking is his problem. Pt states his wife likes to bake cakes. Pt states sometimes he will not eat during the day because he is conscious of calories. Pt denies low blood sugar.  Sleep: 4-5 hrs work days. Goes to sleep 12-1 am. - 4:45 alarm goes off but uses snooze several tiems. Watches TV before bed. RD did not review sleep hygiene, but provided handout and encouraged patient to review.  Stress: 6-7/10 Pt states it's not really bad stress, just "buckle down stress" at work.  Diabetes Self-Management Education - 07/16/19 1058      Visit Information   Visit Type  First/Initial      Initial Visit   Diabetes Type  Type 2    Are you currently following a meal plan?  No    Are you taking your medications as prescribed?  Yes   metformin   Date Diagnosed  2010      Health Coping   How would you rate your overall health?  Good      Psychosocial Assessment   Patient Belief/Attitude about Diabetes  Motivated to manage diabetes    How often do you need to have someone help you when you read instructions, pamphlets, or other written materials from your doctor or pharmacy?  1 - Never    What is the last grade level you completed in school?  associates degree      Complications   Last HgB A1C per  patient/outside source  8.1 %    How often do you check your blood sugar?  1-2 times/day   pre-meal last 2 x 105, 88   Fasting Blood glucose range (mg/dL)  130-179;70-129   120-135 usually 185 highest 1 week ago   Number of hypoglycemic episodes per month  0    Number of hyperglycemic episodes per week  1    Can you tell when your blood sugar is high?  No    Have you had a dilated eye exam in the past 12 months?  No   usually has annual exam   Have you had a dental exam in the past 12 months?  Yes    Are you checking your feet?  Yes    How many days per week are you checking your feet?  7      Dietary Intake   Breakfast  2 boiled eggs, coffee with cream & sugar OR bacon, egg, toast at work,    Media planner (morning)  none    Lunch  [place] salad with chicken,    Snack (afternoon)  none    Building control surveyor, fries, regular coke OR butter beans, corn, roast chicken or ham    Snack (evening)  chips,  popcorn OR something sweet    Beverage(s)  water 32 oz, coffee, diet green tea, regular coke      Exercise   Exercise Type  ADL's    How many days per week to you exercise?  0    How many minutes per day do you exercise?  0    Total minutes per week of exercise  0      Patient Education   Previous Diabetes Education  Yes (please comment)   at dx ~2010   Disease state   Definition of diabetes, type 1 and 2, and the diagnosis of diabetes    Physical activity and exercise   Role of exercise on diabetes management, blood pressure control and cardiac health.    Medications  Reviewed patients medication for diabetes, action, purpose, timing of dose and side effects.    Personal strategies to promote health  Other (comment)   sleep     Individualized Goals (developed by patient)   Nutrition  Other (comment)   reduce sugary beverages   Physical Activity  Exercise 3-5 times per week    Reducing Risk  Other (comment)   better sleep hygiene     Outcomes   Expected Outcomes  Demonstrated interest in  learning. Expect positive outcomes    Future DMSE  4-6 wks    Program Status  Not Completed       Individualized Plan for Diabetes Self-Management Training:   Learning Objective:  Patient will have a greater understanding of diabetes self-management. Patient education plan is to attend individual and/or group sessions per assessed needs and concerns.    Patient Instructions  Goals to help with your blood sugar and overall health: 1. Work on getting into a better sleep routine 2. Exercise: ultimate goal would be 3-5x week; 30-45 min 3. Rethink you drink: consider drinking more water less soda & less sweetener in coffee.  If you want to use checking your blood sugar for tracking your progress the 2 times to check are Fasting and 2 hours after a meal.  Please track your blood sugar for a week before your next appointment and bring your log book with you.   Expected Outcomes:  Demonstrated interest in learning. Expect positive outcomes  Education material provided: A1C conversion sheet, sleep hygiene  If problems or questions, patient to contact team via:  Phone and MyChart  Future DSME appointment: 4-6 wks

## 2019-07-16 NOTE — Patient Instructions (Signed)
Goals to help with your blood sugar and overall health: 1. Work on getting into a better sleep routine 2. Exercise: ultimate goal would be 3-5x week; 30-45 min 3. Rethink you drink: consider drinking more water less soda & less sweetener in coffee.  If you want to use checking your blood sugar for tracking your progress the 2 times to check are Fasting and 2 hours after a meal.  Please track your blood sugar for a week before your next appointment and bring your log book with you.

## 2019-07-19 ENCOUNTER — Other Ambulatory Visit: Payer: Self-pay | Admitting: Nurse Practitioner

## 2019-07-21 ENCOUNTER — Other Ambulatory Visit: Payer: Self-pay

## 2019-07-21 ENCOUNTER — Ambulatory Visit (AMBULATORY_SURGERY_CENTER): Payer: Medicare Other | Admitting: Gastroenterology

## 2019-07-21 ENCOUNTER — Encounter: Payer: Self-pay | Admitting: Gastroenterology

## 2019-07-21 VITALS — BP 104/62 | HR 62 | Temp 97.6°F | Resp 16 | Ht 72.0 in | Wt 270.0 lb

## 2019-07-21 DIAGNOSIS — D124 Benign neoplasm of descending colon: Secondary | ICD-10-CM

## 2019-07-21 DIAGNOSIS — K621 Rectal polyp: Secondary | ICD-10-CM | POA: Diagnosis not present

## 2019-07-21 DIAGNOSIS — D128 Benign neoplasm of rectum: Secondary | ICD-10-CM

## 2019-07-21 DIAGNOSIS — Z86010 Personal history of colonic polyps: Secondary | ICD-10-CM | POA: Diagnosis not present

## 2019-07-21 DIAGNOSIS — D123 Benign neoplasm of transverse colon: Secondary | ICD-10-CM | POA: Diagnosis not present

## 2019-07-21 DIAGNOSIS — K635 Polyp of colon: Secondary | ICD-10-CM

## 2019-07-21 MED ORDER — SODIUM CHLORIDE 0.9 % IV SOLN: 500.0000 mL | Freq: Once | INTRAVENOUS | Status: DC

## 2019-07-21 NOTE — Patient Instructions (Signed)
Thank you for allowing [place] to care for you today!  Await pathology results by mail, approximately 7-10 days.  Resume previous medications today.   Recommend adding FiberCon 1 tablet daily.   Recommend following a high fiber diet, handout given.  Resume your normal activities tomorrow, Wednesday 12/16.      YOU HAD AN ENDOSCOPIC PROCEDURE TODAY AT [facility]:   Refer to the procedure report that was given to you for any specific questions about what was found during the examination.  If the procedure report does not answer your questions, please call your gastroenterologist to clarify.  If you requested that your care partner not be given the details of your procedure findings, then the procedure report has been included in a sealed envelope for you to review at your convenience later.  YOU SHOULD EXPECT: Some feelings of bloating in the abdomen. Passage of more gas than usual.  Walking can help get rid of the air that was put into your GI tract during the procedure and reduce the bloating. If you had a lower endoscopy (such as a colonoscopy or flexible sigmoidoscopy) you may notice spotting of blood in your stool or on the toilet paper. If you underwent a bowel prep for your procedure, you may not have a normal bowel movement for a few days.  Please Note:  You might notice some irritation and congestion in your nose or some drainage.  This is from the oxygen used during your procedure.  There is no need for concern and it should clear up in a day or so.  SYMPTOMS TO REPORT IMMEDIATELY:   Following lower endoscopy (colonoscopy or flexible sigmoidoscopy):  Excessive amounts of blood in the stool  Significant tenderness or worsening of abdominal pains  Swelling of the abdomen that is new, acute  Fever of 100F or higher    For urgent or emergent issues, a gastroenterologist can be reached at any hour by calling [phone].   DIET:  We do recommend a small meal at  first, but then you may proceed to your regular diet.  Drink plenty of fluids but you should avoid alcoholic beverages for 24 hours.  ACTIVITY:  You should plan to take it easy for the rest of today and you should NOT DRIVE or use heavy machinery until tomorrow (because of the sedation medicines used during the test).    FOLLOW UP: Our staff will call the number listed on your records 48-72 hours following your procedure to check on you and address any questions or concerns that you may have regarding the information given to you following your procedure. If we do not reach you, we will leave a message.  We will attempt to reach you two times.  During this call, we will ask if you have developed any symptoms of COVID 19. If you develop any symptoms (ie: fever, flu-like symptoms, shortness of breath, cough etc.) before then, please call [phone].  If you test positive for Covid 19 in the 2 weeks post procedure, please call and report this information to [place].    If any biopsies were taken you will be contacted by phone or by letter within the next 1-3 weeks.  Please call us at [phone] if you have not heard about the biopsies in 3 weeks.    SIGNATURES/CONFIDENTIALITY: You and/or your care partner have signed paperwork which will be entered into your electronic medical record.  These signatures attest to the fact that that the  information above on your After Visit Summary has been reviewed and is understood.  Full responsibility of the confidentiality of this discharge information lies with you and/or your care-partner.

## 2019-07-21 NOTE — Progress Notes (Signed)
Pt's states no medical or surgical changes since previsit or office visit.  Temp - CH Vitals-CW

## 2019-07-21 NOTE — Op Note (Signed)
[facility] Patient Name: Raymond [name] Procedure Date: 07/21/2019 7:57 AM MRN: [id number] Endoscopist: Justice Britain , MD Age: 68 Referring MD:  Date of Birth: 1950/12/26 Gender: Male Account #: [id number] Procedure:                Colonoscopy Indications:              Surveillance: Personal history of adenomatous                            polyps on last colonoscopy 5 years ago Medicines:                Monitored Anesthesia Care Procedure:                Pre-Anesthesia Assessment:                           - Prior to the procedure, a History and Physical                            was performed, and patient medications and                            allergies were reviewed. The patient's tolerance of                            previous anesthesia was also reviewed. The risks                            and benefits of the procedure and the sedation                            options and risks were discussed with the patient.                            All questions were answered, and informed consent                            was obtained. Prior Anticoagulants: The patient has                            taken no previous anticoagulant or antiplatelet                            agents except for aspirin. ASA Grade Assessment: II                            - A patient with mild systemic disease. After                            reviewing the risks and benefits, the patient was                            deemed in satisfactory condition to undergo the  procedure.                           After obtaining informed consent, the colonoscope                            was passed under direct vision. Throughout the                            procedure, the patient's blood pressure, pulse, and                            oxygen saturations were monitored continuously. The                            Colonoscope was introduced through the anus and                       advanced to the the cecum, identified by                            appendiceal orifice and ileocecal valve. The                            colonoscopy was performed without difficulty. The                            patient tolerated the procedure. The quality of the                            bowel preparation was adequate. The ileocecal                            valve, appendiceal orifice, and rectum were                            photographed. Scope In: 8:40:17 AM Scope Out: 9:05:19 AM Scope Withdrawal Time: 0 hours 21 minutes 0 seconds  Total Procedure Duration: 0 hours 25 minutes 2 seconds  Findings:                 Skin tags were found on perianal exam.                           The digital rectal exam findings include                            hemorrhoids. Pertinent negatives include no                            palpable rectal lesions.                           A large amount of semi-liquid stool was found in  the entire colon, interfering with visualization.                            Lavage of the area was performed using copious                            amounts, resulting in clearance with adequate                            visualization.                           Seven sessile polyps were found in the rectum (3),                            descending colon (1), transverse colon (1) and                            hepatic flexure (2). The polyps were 2 to 4 mm in                            size. These polyps were removed with a cold snare.                            Resection and retrieval were complete.                           Multiple small-mouthed diverticula were found in                            the recto-sigmoid colon and sigmoid colon.                           Normal mucosa was found in the entire colon                            otherwise                           Non-bleeding non-thrombosed internal hemorrhoids                             were found during retroflexion, during perianal                            exam and during digital exam. The hemorrhoids were                            Grade II (internal hemorrhoids that prolapse but                            reduce spontaneously). Complications:            No immediate complications. Estimated Blood Loss:     Estimated blood loss was minimal. Impression:               -  Perianal skin tags found on perianal exam.                           - Hemorrhoids found on digital rectal exam.                           - Stool in the entire examined colon. Lavaged with                            adequate visualization.                           - Seven 2 to 4 mm polyps in the rectum, in the                            descending colon, in the transverse colon and at                            the hepatic flexure, removed with a cold snare.                            Resected and retrieved.                           - Diverticulosis in the recto-sigmoid colon and in                            the sigmoid colon.                           - Normal mucosa in the entire examined colon                            otherwise.                           - Non-bleeding non-thrombosed internal hemorrhoids. Recommendation:           - The patient will be observed post-procedure,                            until all discharge criteria are met.                           - Discharge patient to home.                           - Patient has a contact number available for                            emergencies. The signs and symptoms of potential                            delayed complications were discussed with the  patient. Return to normal activities tomorrow.                            Written discharge instructions were provided to the                            patient.                           - High fiber diet.                            - Use FiberCon 1 tablet PO daily.                           - Continue present medications.                           - Await pathology results.                           - Repeat colonoscopy in 3-5 years for surveillance                            based on pathology results and findings of                            adenomatous tissue.                           - The findings and recommendations were discussed                            with the patient. Justice Britain, MD 07/21/2019 9:14:24 AM

## 2019-07-21 NOTE — Progress Notes (Signed)
PT taken to PACU. Monitors in place. VSS. Report given to RN. 

## 2019-07-23 ENCOUNTER — Other Ambulatory Visit: Payer: Self-pay | Admitting: Nurse Practitioner

## 2019-07-23 ENCOUNTER — Telehealth: Payer: Self-pay

## 2019-07-23 ENCOUNTER — Encounter: Payer: Self-pay | Admitting: Gastroenterology

## 2019-07-23 DIAGNOSIS — E785 Hyperlipidemia, unspecified: Secondary | ICD-10-CM

## 2019-07-23 NOTE — Telephone Encounter (Signed)
  Follow up Call-  Call back number 07/21/2019  Post procedure Call Back phone  # [phone]  Permission to leave phone message Yes  Some recent data might be hidden     Patient questions:  Do you have a fever, pain , or abdominal swelling? No. Pain Score  0 *  Have you tolerated food without any problems? Yes.    Have you been able to return to your normal activities? Yes.    Do you have any questions about your discharge instructions: Diet   No. Medications  No. Follow up visit  No.  Do you have questions or concerns about your Care? No.  Actions: * If pain score is 4 or above: No action needed, pain <4.  1. Have you developed a fever since your procedure? no  2.   Have you had an respiratory symptoms (SOB or cough) since your procedure? no  3.   Have you tested positive for COVID 19 since your procedure no  4.   Have you had any family members/close contacts diagnosed with the COVID 19 since your procedure?  no   If yes to any of these questions please route to Joylene John, RN and Alphonsa Gin, Therapist, sports.

## 2019-07-27 ENCOUNTER — Other Ambulatory Visit: Payer: Self-pay

## 2019-07-27 NOTE — Patient Outreach (Signed)
[facility]) Care Management  07/27/2019  ADITYA SZALKOWSKI [birth year] [id number]   Medication Adherence call to Mr. Herndon Geng patient did not answer,patient is showing due on Atorvastatin 40 mg,patient is showing he has already pick up from [facility] on 07/23/19 for a 90 days supply. Mr. Pujols is showing due under [place].  [place] Management Direct Dial [phone]  Fax [phone] [email or web address]

## 2019-07-29 DIAGNOSIS — H40013 Open angle with borderline findings, low risk, bilateral: Secondary | ICD-10-CM | POA: Diagnosis not present

## 2019-07-29 DIAGNOSIS — H35033 Hypertensive retinopathy, bilateral: Secondary | ICD-10-CM | POA: Diagnosis not present

## 2019-07-29 DIAGNOSIS — H35363 Drusen (degenerative) of macula, bilateral: Secondary | ICD-10-CM | POA: Diagnosis not present

## 2019-07-29 DIAGNOSIS — H2512 Age-related nuclear cataract, left eye: Secondary | ICD-10-CM | POA: Diagnosis not present

## 2019-07-29 LAB — HM DIABETES EYE EXAM

## 2019-08-03 ENCOUNTER — Other Ambulatory Visit: Payer: Self-pay

## 2019-08-04 ENCOUNTER — Encounter: Payer: Self-pay | Admitting: Internal Medicine

## 2019-08-04 ENCOUNTER — Ambulatory Visit (INDEPENDENT_AMBULATORY_CARE_PROVIDER_SITE_OTHER): Payer: Medicare Other | Admitting: Internal Medicine

## 2019-08-04 ENCOUNTER — Ambulatory Visit: Payer: Medicare Other | Admitting: Internal Medicine

## 2019-08-04 VITALS — BP 132/82 | HR 74 | Temp 97.6°F | Ht 72.0 in | Wt 267.2 lb

## 2019-08-04 DIAGNOSIS — E1165 Type 2 diabetes mellitus with hyperglycemia: Secondary | ICD-10-CM

## 2019-08-04 DIAGNOSIS — E785 Hyperlipidemia, unspecified: Secondary | ICD-10-CM | POA: Diagnosis not present

## 2019-08-04 DIAGNOSIS — E119 Type 2 diabetes mellitus without complications: Secondary | ICD-10-CM | POA: Insufficient documentation

## 2019-08-04 DIAGNOSIS — R739 Hyperglycemia, unspecified: Secondary | ICD-10-CM | POA: Diagnosis not present

## 2019-08-04 LAB — GLUCOSE, POCT (MANUAL RESULT ENTRY): POC Glucose: 141 mg/dl — AB (ref 70–99)

## 2019-08-04 MED ORDER — GLIPIZIDE 5 MG PO TABS: 5.0000 mg | ORAL_TABLET | Freq: Two times a day (BID) | ORAL | 3 refills | Status: DC

## 2019-08-04 NOTE — Progress Notes (Signed)
Name: Raymond [name]  MRN/ DOB: [id number], 1951-03-10   Age/ Sex: 68 y.o., male    PCP: [name], NP   Reason for Endocrinology Evaluation: Type 2 Diabetes Mellitus     Date of Initial Endocrinology Visit: 08/04/2019     PATIENT IDENTIFIER: Raymond [name] is a 68 y.o. male with a past medical history of T2DM, HTN  And OSA. The patient presented for initial endocrinology clinic visit on 08/04/2019 for consultative assistance with his diabetes management.    HPI: [name] was    Diagnosed with T2DM  In 2014 Prior Medications tried/Intolerance: no Currently checking blood sugars 2 x / day,  before breakfast and evening  Hypoglycemia episodes : no               Hemoglobin A1c has ranged from 6.5% in 2017, peaking at 8.2% in 2020. Patient required assistance for hypoglycemia: no  Patient has required hospitalization within the last 1 year from hyper or hypoglycemia: no  In terms of diet, the patient eats 2-3 meals a day, snacks on average 1-2 x a day. Drinks sweet tea and juice.    HOME DIABETES REGIMEN: Metformin 1000 mg BID   Statin: yes ACE-I/ARB: yes Prior Diabetic Education: yes   METER DOWNLOAD SUMMARY: Did not bring  Fasting 130-140 mg/dL  Evening 90 mg/dL    DIABETIC COMPLICATIONS: Microvascular complications:    Denies: CKD, retinopathy , neuropathy  Last eye exam: Completed 07/2019  Macrovascular complications:    Denies: CAD, PVD, CVA   PAST HISTORY: Past Medical History:  Past Medical History:  Diagnosis Date  . Diabetes mellitus without complication ([place])   . Edema   . GERD (gastroesophageal reflux disease)   . Hyperlipidemia   . Hypertension   . Obesity, unspecified   . Problems with sexual function   . Sleep apnea    released per MD, no CPAP  . Unspecified sleep apnea   . Unspecified vitamin D deficiency    Past Surgical History:  Past Surgical History:  Procedure Laterality Date  . CATARACT EXTRACTION  1964   Dr.Freed   . COLONOSCOPY    . HERNIA REPAIR  1987   Right Side, Dr.Kaufman   . POLYPECTOMY        Social History:  reports that he has never smoked. He has never used smokeless tobacco. He reports that he does not drink alcohol or use drugs. Family History:  Family History  Problem Relation Age of Onset  . Colon cancer Mother 25  . Breast cancer Sister   . Breast cancer Father   . Ovarian cancer Sister   . Esophageal cancer Neg Hx   . Stomach cancer Neg Hx   . Rectal cancer Neg Hx   . Colon polyps Neg Hx      HOME MEDICATIONS: Allergies as of 08/04/2019   No Known Allergies     Medication List       Accurate as of August 04, 2019 12:28 PM. If you have any questions, ask your nurse or doctor.        aspirin 81 MG tablet Take 81 mg by mouth daily.   atorvastatin 40 MG tablet Commonly known as: LIPITOR TAKE 1 TABLET BY MOUTH EVERYDAY AT BEDTIME   cholecalciferol 1000 units tablet Commonly known as: VITAMIN D Take 1,000 Units by mouth daily.   enalapril 5 MG tablet Commonly known as: VASOTEC TAKE 1 TABLET BY MOUTH EVERY DAY   glipiZIDE 5  MG tablet Commonly known as: GLUCOTROL Take 1 tablet (5 mg total) by mouth 2 (two) times daily before a meal.   glucose blood test strip Freestyle Lite Test Strip: Use to check blood sugar 2-3 x weekly Dx: E11.9   metFORMIN 1000 MG tablet Commonly known as: GLUCOPHAGE TAKE 1 TABLET (1,000 MG TOTAL) BY MOUTH 2 (TWO) TIMES DAILY WITH A MEAL.        ALLERGIES: No Known Allergies   REVIEW OF SYSTEMS: A comprehensive ROS was conducted with the patient and is negative except as per HPI and below:  Review of Systems  Constitutional: Negative for chills and fever.  HENT: Negative for congestion and sore throat.   Eyes: Negative for blurred vision and pain.  Respiratory: Negative for cough and shortness of breath.   Cardiovascular: Negative for chest pain and palpitations.  Gastrointestinal: Negative for diarrhea and  nausea.  Genitourinary: Positive for frequency.  Neurological: Negative for tingling and tremors.  Endo/Heme/Allergies: Negative for polydipsia.  Psychiatric/Behavioral: Negative for depression. The patient is not nervous/anxious.       OBJECTIVE:   VITAL SIGNS: BP 132/82 (BP Location: Left Arm, Patient Position: Sitting, Cuff Size: Large)   Pulse 74   Temp 97.6 F (36.4 C)   Ht 6' (1.829 m)   Wt 267 lb 3.2 oz (121.2 kg)   SpO2 98%   BMI 36.24 kg/m    PHYSICAL EXAM:  General: Pt appears well and is in NAD  HEENT:  Eyes: External eye exam normal without stare, lid lag or exophthalmos.  EOM intact.   Neck: General: Supple without adenopathy or carotid bruits. Thyroid: Thyroid size normal.  No goiter or nodules appreciated. No thyroid bruit.  Lungs: Clear with good BS bilat with no rales, rhonchi, or wheezes  Heart: RRR with normal S1 and S2 and no gallops; no murmurs; no rub  Abdomen: Normoactive bowel sounds, soft, nontender, without masses or organomegaly palpable  Extremities:  Lower extremities - No pretibial edema.   Skin: Normal texture and temperature to palpation. No rash noted. No Acanthosis nigricans/skin tags. No lipohypertrophy.  Neuro: MS is good with appropriate affect, pt is alert and Ox3    DM foot exam: 08/04/2019  The skin of the feet is intact without sores or ulcerations. The pedal pulses are 2+ on right and 2+ on left. The sensation is intact to a screening 5.07, 10 gram monofilament bilaterally   DATA REVIEWED:  Lab Results  Component Value Date   HGBA1C 8.1 (H) 06/18/2019   HGBA1C 8.2 (H) 12/15/2018   HGBA1C 7.7 (H) 05/30/2018   Lab Results  Component Value Date   MICROALBUR 0.4 05/30/2018   LDLCALC 57 12/15/2018   CREATININE 1.02 06/22/2019   Lab Results  Component Value Date   MICRALBCREAT 10 05/30/2018    Lab Results  Component Value Date   CHOL 108 12/15/2018   HDL 28 (L) 12/15/2018   LDLCALC 57 12/15/2018   TRIG 143  12/15/2018   CHOLHDL 3.9 12/15/2018        ASSESSMENT / PLAN / RECOMMENDATIONS:   1) Type 2 Diabetes Mellitus, Sub-Optimally controlled, Without complications - Most recent A1c of 8.1 %. Goal A1c < 7.0 %.    Plan: GENERAL: I have discussed with the patient the pathophysiology of diabetes. We went over the natural progression of the disease. We talked about both insulin resistance and insulin deficiency. We stressed the importance of lifestyle changes including diet and exercise. I explained the complications associated with  diabetes including retinopathy, nephropathy, neuropathy as well as increased risk of cardiovascular disease. We went over the benefit seen with glycemic control.    I explained to the patient that diabetic patients are at higher than normal risk for amputations.  We discussed the importance of diet and exercise in glycemic control  We discussed add-on therapy. After discussing the benefits and pricing on some, pt opted with Glipizide as he just got this new insurance and he is not sure about coverage.   We discussed the risk of hypoglycemia and weight gain with glipizide.      MEDICATIONS: Continue Metformin 1000 mg Twice daily  Start Glipizide 5 mg before breakfast and supper    EDUCATION / INSTRUCTIONS:  BG monitoring instructions: Patient is instructed to check his blood sugars 2 times a day, fasting and supper time.  Call [facility] if: BG persistently < 70 or > 300. . I reviewed the Rule of 15 for the treatment of hypoglycemia in detail with the patient. Literature supplied.   2) Diabetic complications:   Eye: Does not  have known diabetic retinopathy.   Neuro/ Feet: Does not have known diabetic peripheral neuropathy.  Renal: Patient does not have known baseline CKD. He is  on an ACEI/ARB at present.  3) Lipids: Patient is  on a statin. We discussed the cardiovascular benefits of statins. LDL is at goal .   4) Hypertension: He  is at goal of < 140/90 mmHg.   F/u in 3 months    Signed electronically by: Mack Guise, MD  [facility] Endocrinology  [facility] [address] [place], [place] [address] Phone: [phone] FAX: [phone]   CC: [name], NP [place] [address] Phone: [phone]  Fax: [phone]    Return to Endocrinology clinic as below: Future Appointments  Date Time Provider [place]  08/28/2019  8:00 AM Christella Hartigan, RD [place] [facility]  11/10/2019  8:30 AM Shameka Aggarwal, Melanie Crazier, MD [facility] None  12/17/2019 10:00 AM [name], NP [facility] None  12/21/2019  8:30 AM [facility] LAB [facility] None  12/25/2019  9:30 AM [name], NP [facility] None  12/25/2019 10:30 AM [name], NP [facility] None

## 2019-08-04 NOTE — Patient Instructions (Signed)
-   Continue Metformin 1000 mg Twice daily  - Start Glipizide 5 mg before breakfast and supper      - Check sugar before Breakfast and Supper   - Choose healthy, lower carb lower calorie snacks: toss salad, cooked vegetables, cottage cheese, peanut butter, low fat cheese / string cheese, lower sodium deli meat, tuna salad or chicken salad      HOW TO TREAT LOW BLOOD SUGARS (Blood sugar LESS THAN 70 MG/DL)  Please follow the RULE OF 15 for the treatment of hypoglycemia treatment (when your (blood sugars are less than 70 mg/dL)    STEP 1: Take 15 grams of carbohydrates when your blood sugar is low, which includes:   3-4 GLUCOSE TABS  OR  3-4 OZ OF JUICE OR REGULAR SODA OR  ONE TUBE OF GLUCOSE GEL     STEP 2: RECHECK blood sugar in 15 MINUTES STEP 3: If your blood sugar is still low at the 15 minute recheck --> then, go back to STEP 1 and treat AGAIN with another 15 grams of carbohydrates.

## 2019-08-17 DIAGNOSIS — L578 Other skin changes due to chronic exposure to nonionizing radiation: Secondary | ICD-10-CM | POA: Diagnosis not present

## 2019-08-17 DIAGNOSIS — L570 Actinic keratosis: Secondary | ICD-10-CM | POA: Diagnosis not present

## 2019-08-28 ENCOUNTER — Encounter: Payer: Self-pay | Admitting: Registered"

## 2019-08-28 ENCOUNTER — Encounter: Payer: Medicare Other | Attending: Nurse Practitioner | Admitting: Registered"

## 2019-08-28 ENCOUNTER — Other Ambulatory Visit: Payer: Self-pay

## 2019-08-28 DIAGNOSIS — E1165 Type 2 diabetes mellitus with hyperglycemia: Secondary | ICD-10-CM | POA: Diagnosis not present

## 2019-08-28 DIAGNOSIS — E119 Type 2 diabetes mellitus without complications: Secondary | ICD-10-CM

## 2019-08-28 NOTE — Patient Instructions (Signed)
Great job on cutting out soda! Consider adding some nuts to your morning oatmeal for protein. Aim to eat a more substantial lunch when you are at work, may help prevent low blood sugar.  When you have the symptoms of low blood sugar, check and treat if low. If you continue to have low blood sugar events, let your doctor know before your next appointment with her in April.

## 2019-08-29 NOTE — Progress Notes (Signed)
Diabetes Self-Management Education  Visit Type: Follow-up  Appt. Start Time: 0810 Appt. End Time: 0840  08/28/2019  [name], identified by name and date of birth, is a 69 y.o. male with a diagnosis of Diabetes: Type 2.   ASSESSMENT  There were no vitals taken for this visit. There is no height or weight on file to calculate BMI.  Pt states he has cut out soda. Sometimes he drinks diet green tea. Pt has also increased his vegetable intake.   Pt reports 2 hypoglycemic events since last visit. One episode he states he checked BG d/t feeling off, not terrible, but states it is hard to describe. Pt reports reading was in 59s. Pt states his BG goes down in afternoon, sometimes gets busy and doesn't eat much in the day. Pt states he was instructed by MD to take glipizide before he eats to help prevent low blood sugar.  Pt states he will return for f/u after his next A1c is done.  Diabetes Self-Management Education - 08/28/19 0817      Visit Information   Visit Type  Follow-up      Initial Visit   Diabetes Type  Type 2      Complications   How often do you check your blood sugar?  1-2 times/day    Fasting Blood glucose range (mg/dL)  70-129;130-179    Number of hypoglycemic episodes per month  1    Can you tell when your blood sugar is low?  Yes    What do you do if your blood sugar is low?  peppermint candy      Dietary Intake   Breakfast  coffee, oatmeal    Snack (morning)  none    Lunch  salad, meat    Snack (afternoon)  apple slices, PB    Dinner  salad, chicken OR chili mac    Snack (evening)  honey roasted peanuts    Beverage(s)  water, coffee, flavored water      Patient Education   Nutrition management   Role of diet in the treatment of diabetes and the relationship between the three main macronutrients and blood glucose level;Meal options for control of blood glucose level and chronic complications.    Physical activity and exercise   Identified with patient  nutritional and/or medication changes necessary with exercise.      Individualized Goals (developed by patient)   Nutrition  General guidelines for healthy choices and portions discussed    Monitoring   test my blood glucose as discussed    Reducing Risk  treat hypoglycemia with 15 grams of carbs if blood glucose less than 69m/dL      Outcomes   Expected Outcomes  Demonstrated interest in learning. Expect positive outcomes    Future DMSE  3-4 months    Program Status  Not Completed      Subsequent Visit   Since your last visit have you continued or begun to take your medications as prescribed?  Yes    Since your last visit, are you checking your blood glucose at least once a day?  Yes       Individualized Plan for Diabetes Self-Management Training:   Learning Objective:  Patient will have a greater understanding of diabetes self-management. Patient education plan is to attend individual and/or group sessions per assessed needs and concerns.   Patient Instructions  GDoristine Devoidjob on cutting out soda! Consider adding some nuts to your morning oatmeal for protein. Aim to eat  a more substantial lunch when you are at work, may help prevent low blood sugar.  When you have the symptoms of low blood sugar, check and treat if low. If you continue to have low blood sugar events, let your doctor know before your next appointment with her in April.    Expected Outcomes:  Demonstrated interest in learning. Expect positive outcomes  Education material provided: none  If problems or questions, patient to contact team via:  Phone and MyChart  Future DSME appointment: 3-4 months

## 2019-10-27 ENCOUNTER — Other Ambulatory Visit: Payer: Self-pay | Admitting: Internal Medicine

## 2019-11-09 NOTE — Progress Notes (Signed)
Name: Raymond [name]  Age/ Sex: 69 y.o., male   MRN/ DOB: [id number], 1951/07/16     PCP: [name], NP   Reason for Endocrinology Evaluation: Type 2 Diabetes Mellitus  Initial Endocrine Consultative Visit: 08/04/2019    PATIENT IDENTIFIER: [name] is a 69 y.o. male with a past medical history of T2DM, HTN and OSA. The patient has followed with Endocrinology clinic since 08/04/2019 for consultative assistance with management of his diabetes.  DIABETIC HISTORY:  Raymond [name] was diagnosed with DM in 2014. He has been on Metformin since his diagnosis. His hemoglobin A1c has ranged from 6.5% in 2017, peaking at 8.2% in 2020.  On his initial visit to our clinic his A1c was 8.1%, he was on max dose of metformin, glipizide was added.  SUBJECTIVE:   During the last visit (08/04/2019): A1c 8.1%, continued Metformin and added glipizide  Today (11/11/2019): Raymond [name] is here for a follow up on diabetes management.  He checks his blood sugars 2 times daily, preprandial to breakfast and . The patient has not had hypoglycemic episodes since the last clinic visit.    HOME DIABETES REGIMEN:  Metformin 1000 mg twice daily Glipizide 5 mg twice daily     Statin: Yes ACE-I/ARB: Yes   GLUCOSE LOG:       DIABETIC COMPLICATIONS: Microvascular complications:    Denies: CKD, retinopathy , neuropathy  Last eye exam: Completed 07/2019  Macrovascular complications:    Denies: CAD, PVD, CVA    HISTORY:  Past Medical History:  Past Medical History:  Diagnosis Date  . Diabetes mellitus without complication ([place])   . Edema   . GERD (gastroesophageal reflux disease)   . Hyperlipidemia   . Hypertension   . Obesity, unspecified   . Problems with sexual function   . Sleep apnea    released per MD, no CPAP  . Unspecified sleep apnea   . Unspecified vitamin D deficiency    Past Surgical History:  Past Surgical History:  Procedure Laterality Date  .  CATARACT EXTRACTION  1964   Dr.Freed   . COLONOSCOPY    . HERNIA REPAIR  1987   Right Side, Dr.Kaufman   . POLYPECTOMY      Social History:  reports that he has never smoked. He has never used smokeless tobacco. He reports that he does not drink alcohol or use drugs. Family History:  Family History  Problem Relation Age of Onset  . Colon cancer Mother 44  . Breast cancer Sister   . Breast cancer Father   . Ovarian cancer Sister   . Esophageal cancer Neg Hx   . Stomach cancer Neg Hx   . Rectal cancer Neg Hx   . Colon polyps Neg Hx      HOME MEDICATIONS: Allergies as of 11/10/2019   No Known Allergies     Medication List       Accurate as of November 10, 2019 11:59 PM. If you have any questions, ask your nurse or doctor.        aspirin 81 MG tablet Take 81 mg by mouth daily.   atorvastatin 40 MG tablet Commonly known as: LIPITOR TAKE 1 TABLET BY MOUTH EVERYDAY AT BEDTIME   cholecalciferol 1000 units tablet Commonly known as: VITAMIN D Take 1,000 Units by mouth daily.   enalapril 5 MG tablet Commonly known as: VASOTEC TAKE 1 TABLET BY MOUTH EVERY DAY   glipiZIDE 5 MG tablet Commonly  known as: GLUCOTROL Take 0.5 tablets (2.5 mg total) by mouth daily before breakfast AND 1 tablet (5 mg total) daily before supper. What changed: See the new instructions. Changed by: Dorita Sciara, MD   glucose blood test strip Freestyle Lite Test Strip: Use to check blood sugar 2-3 x weekly Dx: E11.9   metFORMIN 1000 MG tablet Commonly known as: GLUCOPHAGE TAKE 1 TABLET (1,000 MG TOTAL) BY MOUTH 2 (TWO) TIMES DAILY WITH A MEAL.        OBJECTIVE:   Vital Signs: BP 118/82 (BP Location: Left Arm, Patient Position: Sitting, Cuff Size: Large)   Pulse 67   Temp 98.2 F (36.8 C)   Ht 6' (1.829 m)   Wt 250 lb 6.4 oz (113.6 kg)   SpO2 97%   BMI 33.96 kg/m   Wt Readings from Last 3 Encounters:  11/10/19 250 lb 6.4 oz (113.6 kg)  08/04/19 267 lb 3.2 oz (121.2 kg)    07/21/19 270 lb (122.5 kg)     Exam: General: Pt appears well and is in NAD  Lungs: Clear with good BS bilat with no rales, rhonchi, or wheezes  Heart: RRR with normal S1 and S2 and no gallops; no murmurs; no rub  Abdomen: Normoactive bowel sounds, soft, nontender, without masses or organomegaly palpable  Extremities: 1 + pretibial edema.  Neuro: MS is good with appropriate affect, pt is alert and Ox3      DM foot exam: 08/04/2019  The skin of the feet is intact without sores or ulcerations. The pedal pulses are 2+ on right and 2+ on left. The sensation is intact to a screening 5.07, 10 gram monofilament bilaterally    DATA REVIEWED:  Lab Results  Component Value Date   HGBA1C 6.3 (A) 11/10/2019   HGBA1C 8.1 (H) 06/18/2019   HGBA1C 8.2 (H) 12/15/2018   Lab Results  Component Value Date   MICROALBUR 0.9 11/10/2019   LDLCALC 57 12/15/2018   CREATININE 1.02 06/22/2019   Lab Results  Component Value Date   MICRALBCREAT 0.5 11/10/2019     Lab Results  Component Value Date   CHOL 108 12/15/2018   HDL 28 (L) 12/15/2018   LDLCALC 57 12/15/2018   TRIG 143 12/15/2018   CHOLHDL 3.9 12/15/2018         ASSESSMENT / PLAN / RECOMMENDATIONS:   1) Type 2 Diabetes Mellitus, Optimally controlled, Without complications, Most recent A1c of 6.3 %. Goal A1c < 7.0 %.      -I have praised the pt on recent weight loss (~16 lbs), patient has done a lot of dietary changes, I have encouraged him to continue with the current lifestyle changes, he is motivated to continue to do some more. -In review of his glucose log he was noted to have tight BG's at suppertime, we will adjust his morning dose of glipizide as below.    Plan: MEDICATIONS:  Metformin 1000 mg twice daily  Glipizide 5 mg, half a tablet before breakfast and 1 tablet before supper  EDUCATION / INSTRUCTIONS:  BG monitoring instructions: Patient is instructed to check his blood sugars 2 times a day, before  breakfast and supper.  Call [facility] if: BG persistently < 70 or > 300. . I reviewed the Rule of 15 for the treatment of hypoglycemia in detail with the patient. Literature supplied.     2) Diabetic complications:   Eye: Does not have known diabetic retinopathy.   Neuro/ Feet: Does not have known diabetic  peripheral neuropathy .   Renal: Patient does not have known baseline CKD. He   is  on an ACEI/ARB at present.  Today's check urine albumin/creatinine ratio is normal     F/U in 4 months   Signed electronically by: Mack Guise, MD  [facility] Endocrinology  [facility] [address] [place], [place] [address] Phone: [phone] FAX: [phone]   CC: [name], NP [place] [address] Phone: [phone]  Fax: [phone]  Return to Endocrinology clinic as below: Future Appointments  Date Time Provider [place]  11/16/2019  8:30 AM Christella Hartigan, RD [place] NDM  12/17/2019 10:00 AM [name], NP [facility] None  12/25/2019  9:30 AM [name], NP [facility] None  12/25/2019 10:30 AM [name], NP [facility] None  03/11/2020  9:10 AM Kalana Yust, Melanie Crazier, MD [facility] None

## 2019-11-10 ENCOUNTER — Encounter: Payer: Self-pay | Admitting: Internal Medicine

## 2019-11-10 ENCOUNTER — Other Ambulatory Visit: Payer: Self-pay

## 2019-11-10 ENCOUNTER — Ambulatory Visit: Payer: Medicare Other | Admitting: Internal Medicine

## 2019-11-10 VITALS — BP 118/82 | HR 67 | Temp 98.2°F | Ht 72.0 in | Wt 250.4 lb

## 2019-11-10 DIAGNOSIS — E119 Type 2 diabetes mellitus without complications: Secondary | ICD-10-CM | POA: Diagnosis not present

## 2019-11-10 LAB — MICROALBUMIN / CREATININE URINE RATIO
Creatinine,U: 167.5 mg/dL
Microalb Creat Ratio: 0.5 mg/g (ref 0.0–30.0)
Microalb, Ur: 0.9 mg/dL (ref 0.0–1.9)

## 2019-11-10 LAB — POCT GLYCOSYLATED HEMOGLOBIN (HGB A1C): Hemoglobin A1C: 6.3 % — AB (ref 4.0–5.6)

## 2019-11-10 MED ORDER — GLIPIZIDE 5 MG PO TABS: ORAL_TABLET | ORAL | 3 refills | Status: DC

## 2019-11-10 NOTE — Patient Instructions (Signed)
-   Continue Metformin 1000 mg Twice daily  -  Glipizide 5 mg, HALF a tablet  before breakfast and ONE tablet before supper        HOW TO TREAT LOW BLOOD SUGARS (Blood sugar LESS THAN 70 MG/DL)  Please follow the RULE OF 15 for the treatment of hypoglycemia treatment (when your (blood sugars are less than 70 mg/dL)    STEP 1: Take 15 grams of carbohydrates when your blood sugar is low, which includes:   3-4 GLUCOSE TABS  OR  3-4 OZ OF JUICE OR REGULAR SODA OR  ONE TUBE OF GLUCOSE GEL     STEP 2: RECHECK blood sugar in 15 MINUTES STEP 3: If your blood sugar is still low at the 15 minute recheck --> then, go back to STEP 1 and treat AGAIN with another 15 grams of carbohydrates.

## 2019-11-16 ENCOUNTER — Other Ambulatory Visit: Payer: Self-pay

## 2019-11-16 ENCOUNTER — Encounter: Payer: Medicare Other | Attending: Nurse Practitioner | Admitting: Registered"

## 2019-11-16 ENCOUNTER — Encounter: Payer: Self-pay | Admitting: Registered"

## 2019-11-16 DIAGNOSIS — E1165 Type 2 diabetes mellitus with hyperglycemia: Secondary | ICD-10-CM | POA: Insufficient documentation

## 2019-11-16 DIAGNOSIS — E119 Type 2 diabetes mellitus without complications: Secondary | ICD-10-CM

## 2019-11-16 NOTE — Progress Notes (Signed)
Diabetes Self-Management Education  Visit Type: Follow-up  Appt. Start Time: 0835 Appt. End Time: 0905  11/20/2019  [name], identified by name and date of birth, is a 69 y.o. male with a diagnosis of Diabetes: Type 2.   ASSESSMENT  There were no vitals taken for this visit. There is no height or weight on file to calculate BMI.   Pt states he has lost weight before, but it gradually comes back. Pt states his wife cooks country style protein, greens, butter beans, corn, green peas. Pt states he doesn't want to tell her what to cook. Pt states he eats salalds often. Pt reports changes in the summer are not too hard, but states the winter holidays and all the comfort food he enjoys is hard to resist.  Patient reports checking blood sugar: FBS: 110-125 mg/dL Pre-dinner: 79-110 mg/dL Patient states he was not aware of reason for post-meal readings and plans to start checking after some meals  Patient plans to return in August to talk about strategies to keep him on course during the holidays.  Diabetes Self-Management Education - 11/16/19 0800      Visit Information   Visit Type  Follow-up      Initial Visit   Diabetes Type  Type 2      Pre-Education Assessment   Patient understands monitoring blood glucose, interpreting and using results  Needs Review      Complications   Fasting Blood glucose range (mg/dL)  70-129   110-125     Dietary Intake   Breakfast  oatmeal, nuts OR eggs, bacon    Lunch  salads    Dinner  meat, greens, small amount of carbs    Beverage(s)  water, diet soda, 2 glasses sweet tea.      Patient Education   Nutrition management   Reviewed blood glucose goals for pre and post meals and how to evaluate the patients' food intake on their blood glucose level.    Monitoring  Purpose and frequency of SMBG.      Individualized Goals (developed by patient)   Nutrition  --   find comfort food low in carbs   Monitoring   test my blood glucose as  discussed      Patient Self-Evaluation of Goals - Patient rates self as meeting previously set goals (% of time)   Nutrition  >75%    Monitoring  50 - 75 %      Post-Education Assessment   Patient understands monitoring blood glucose, interpreting and using results  Demonstrates understanding / competency      Outcomes   Expected Outcomes  Demonstrated interest in learning. Expect positive outcomes    Future DMSE  6 months    Program Status  Completed      Subsequent Visit   Since your last visit have you continued or begun to take your medications as prescribed?  Yes    Since your last visit have you had your blood pressure checked?  Yes    Is your most recent blood pressure lower, unchanged, or higher since your last visit?  Unchanged    Since your last visit have you experienced any weight changes?  Loss    Weight Loss (lbs)  16    Since your last visit, are you checking your blood glucose at least once a day?  Yes       Individualized Plan for Diabetes Self-Management Training:   Learning Objective:  Patient will have a greater understanding  of diabetes self-management. Patient education plan is to attend individual and/or group sessions per assessed needs and concerns.   Plan:   Patient Instructions  Checking 1 1/2 - 2 hrs after a meal will tell you how that meal has affected your blood sugar.  Consider diluting the sweet tea, good that you are not drinking it often. Good work on finding ways to increase water.  When it gets closer to the fall, start experimenting with comfort food that is lower in carbohydrates.  Great work on the changes you have made, remember to use indicators of health that you have more control over such as increased exercise, more vegetables.   Expected Outcomes:  Demonstrated interest in learning. Expect positive outcomes  Education material provided: none  If problems or questions, patient to contact team via:  Phone and Mychart  Future  DSME appointment: 6 months

## 2019-11-16 NOTE — Patient Instructions (Addendum)
Checking 1 1/2 - 2 hrs after a meal will tell you how that meal has affected your blood sugar.  Consider diluting the sweet tea, good that you are not drinking it often. Good work on finding ways to increase water.  When it gets closer to the fall, start experimenting with comfort food that is lower in carbohydrates.  Great work on the changes you have made, remember to use indicators of health that you have more control over such as increased exercise, more vegetables.

## 2019-12-03 ENCOUNTER — Other Ambulatory Visit: Payer: Self-pay | Admitting: Nurse Practitioner

## 2019-12-07 ENCOUNTER — Ambulatory Visit: Payer: Medicare Other | Admitting: Podiatry

## 2019-12-07 ENCOUNTER — Other Ambulatory Visit: Payer: Self-pay

## 2019-12-07 ENCOUNTER — Encounter: Payer: Self-pay | Admitting: Podiatry

## 2019-12-07 DIAGNOSIS — M79674 Pain in right toe(s): Secondary | ICD-10-CM

## 2019-12-07 DIAGNOSIS — Q828 Other specified congenital malformations of skin: Secondary | ICD-10-CM | POA: Insufficient documentation

## 2019-12-07 DIAGNOSIS — M216X2 Other acquired deformities of left foot: Secondary | ICD-10-CM | POA: Insufficient documentation

## 2019-12-07 DIAGNOSIS — B351 Tinea unguium: Secondary | ICD-10-CM | POA: Diagnosis not present

## 2019-12-07 DIAGNOSIS — M79675 Pain in left toe(s): Secondary | ICD-10-CM

## 2019-12-07 NOTE — Progress Notes (Signed)
This patient returns to my office for at risk foot care.  This patient requires this care by a professional since this patient will be at risk due to having diabetes.   He says he has injured his big toenail left foot and it has regrown thick and disfigured,  His big toenail right foot is also thickened.  He has been  experiencing pain in both toes but especially his left great toenail.  He also has occasional burning sub 5th metatarsal head left foot.  This patient is unable to cut nails himself since the patient cannot reach his nails.These nails are painful walking and wearing shoes.  This patient presents for at risk foot care today.  General Appearance  Alert, conversant and in no acute stress.  Vascular  Dorsalis pedis and posterior tibial  pulses are palpable  bilaterally.  Capillary return is within normal limits  bilaterally. Temperature is within normal limits  bilaterally.  Neurologic  Senn-Weinstein monofilament wire test within normal limits  bilaterally. Muscle power within normal limits bilaterally.  Nails Thick disfigured discolored nails with subungual debris  from hallux to fifth toes bilaterally. No evidence of bacterial infection or drainage bilaterally.  Orthopedic  No limitations of motion  feet .  No crepitus or effusions noted.  No bony pathology or digital deformities noted. Mild  HAV  B/L.  Plantar flex fifth metatarsal left foot.  Skin  normotropic skin with no porokeratosis noted bilaterally.  No signs of infections or ulcers noted.     Onychomycosis  Pain in right toes  Pain in left toes.  Porokeratosis sub 5th met left foot.  Consent was obtained for treatment procedures.   Mechanical debridement of nails 1-5  bilaterally performed with a nail nipper.  Filed with dremel without incident. Discussed possible nail surgery for permanent elimination of nail plate left hallux.  Debride callus sub 5th with dremel tool.  Padding dispensed and patient was instructed to apply  padding to his insole.  Return office visit  3 months.                   Told patient to return for periodic foot care and evaluation due to potential at risk complications.   Gregory Mayer DPM  

## 2019-12-17 ENCOUNTER — Encounter: Payer: Medicare Other | Admitting: Nurse Practitioner

## 2019-12-21 ENCOUNTER — Other Ambulatory Visit: Payer: Medicare Other

## 2019-12-25 ENCOUNTER — Encounter: Payer: Self-pay | Admitting: Nurse Practitioner

## 2019-12-25 ENCOUNTER — Other Ambulatory Visit: Payer: Self-pay

## 2019-12-25 ENCOUNTER — Ambulatory Visit (INDEPENDENT_AMBULATORY_CARE_PROVIDER_SITE_OTHER): Payer: Medicare Other | Admitting: Nurse Practitioner

## 2019-12-25 VITALS — BP 128/80 | HR 71 | Temp 96.8°F | Ht 72.0 in | Wt 251.0 lb

## 2019-12-25 VITALS — BP 128/80 | Temp 96.8°F | Ht 72.0 in | Wt 251.0 lb

## 2019-12-25 DIAGNOSIS — E785 Hyperlipidemia, unspecified: Secondary | ICD-10-CM

## 2019-12-25 DIAGNOSIS — I10 Essential (primary) hypertension: Secondary | ICD-10-CM

## 2019-12-25 DIAGNOSIS — E559 Vitamin D deficiency, unspecified: Secondary | ICD-10-CM

## 2019-12-25 DIAGNOSIS — E1165 Type 2 diabetes mellitus with hyperglycemia: Secondary | ICD-10-CM

## 2019-12-25 DIAGNOSIS — L989 Disorder of the skin and subcutaneous tissue, unspecified: Secondary | ICD-10-CM

## 2019-12-25 DIAGNOSIS — Z0000 Encounter for general adult medical examination without abnormal findings: Secondary | ICD-10-CM | POA: Diagnosis not present

## 2019-12-25 DIAGNOSIS — Z6836 Body mass index (BMI) 36.0-36.9, adult: Secondary | ICD-10-CM

## 2019-12-25 NOTE — Patient Instructions (Addendum)
Raymond [name] , Thank you for taking time to come for your Medicare Wellness Visit. I appreciate your ongoing commitment to your health goals. Please review the following plan we discussed and let me know if I can assist you in the future.   Screening recommendations/referrals: Colonoscopy up to date 2023 Recommended yearly ophthalmology/optometry visit for glaucoma screening and checkup Recommended yearly dental visit for hygiene and checkup  Vaccinations: Influenza vaccine up to date, DUE September 2021 Pneumococcal vaccine up to date Tdap vaccine up to date Shingles vaccine RECOMMEND to get- at local pharmacy and get shingrix     Advanced directives: recommend to complete and bring to place on file  Conditions/risks identified: advance age, cardiovascular disease, obesity, diabetes, hypertension, hyperlipidemia.   Next appointment: 1 year  Preventive Care 62 Years and Older, Male Preventive care refers to lifestyle choices and visits with your health care provider that can promote health and wellness. What does preventive care include?  A yearly physical exam. This is also called an annual well check.  Dental exams once or twice a year.  Routine eye exams. Ask your health care provider how often you should have your eyes checked.  Personal lifestyle choices, including:  Daily care of your teeth and gums.  Regular physical activity.  Eating a healthy diet.  Avoiding tobacco and drug use.  Limiting alcohol use.  Practicing safe sex.  Taking low doses of aspirin every day.  Taking vitamin and mineral supplements as recommended by your health care provider. What happens during an annual well check? The services and screenings done by your health care provider during your annual well check will depend on your age, overall health, lifestyle risk factors, and family history of disease. Counseling  Your health care provider may ask you questions about your:  Alcohol  use.  Tobacco use.  Drug use.  Emotional well-being.  Home and relationship well-being.  Sexual activity.  Eating habits.  History of falls.  Memory and ability to understand (cognition).  Work and work Statistician. Screening  You may have the following tests or measurements:  Height, weight, and BMI.  Blood pressure.  Lipid and cholesterol levels. These may be checked every 5 years, or more frequently if you are over 36 years old.  Skin check.  Lung cancer screening. You may have this screening every year starting at age 65 if you have a 30-pack-year history of smoking and currently smoke or have quit within the past 15 years.  Fecal occult blood test (FOBT) of the stool. You may have this test every year starting at age 40.  Flexible sigmoidoscopy or colonoscopy. You may have a sigmoidoscopy every 5 years or a colonoscopy every 10 years starting at age 50.  Prostate cancer screening. Recommendations will vary depending on your family history and other risks.  Hepatitis C blood test.  Hepatitis B blood test.  Sexually transmitted disease (STD) testing.  Diabetes screening. This is done by checking your blood sugar (glucose) after you have not eaten for a while (fasting). You may have this done every 1-3 years.  Abdominal aortic aneurysm (AAA) screening. You may need this if you are a current or former smoker.  Osteoporosis. You may be screened starting at age 29 if you are at high risk. Talk with your health care provider about your test results, treatment options, and if necessary, the need for more tests. Vaccines  Your health care provider may recommend certain vaccines, such as:  Influenza vaccine. This is recommended  every year.  Tetanus, diphtheria, and acellular pertussis (Tdap, Td) vaccine. You may need a Td booster every 10 years.  Zoster vaccine. You may need this after age 50.  Pneumococcal 13-valent conjugate (PCV13) vaccine. One dose is  recommended after age 14.  Pneumococcal polysaccharide (PPSV23) vaccine. One dose is recommended after age 61. Talk to your health care provider about which screenings and vaccines you need and how often you need them. This information is not intended to replace advice given to you by your health care provider. Make sure you discuss any questions you have with your health care provider. Document Released: 08/19/2015 Document Revised: 04/11/2016 Document Reviewed: 05/24/2015 Elsevier Interactive Patient Education  2017 [place] Prevention in the Home Falls can cause injuries. They can happen to people of all ages. There are many things you can do to make your home safe and to help prevent falls. What can I do on the outside of my home?  Regularly fix the edges of walkways and driveways and fix any cracks.  Remove anything that might make you trip as you walk through a door, such as a raised step or threshold.  Trim any bushes or trees on the path to your home.  Use bright outdoor lighting.  Clear any walking paths of anything that might make someone trip, such as rocks or tools.  Regularly check to see if handrails are loose or broken. Make sure that both sides of any steps have handrails.  Any raised decks and porches should have guardrails on the edges.  Have any leaves, snow, or ice cleared regularly.  Use sand or salt on walking paths during winter.  Clean up any spills in your garage right away. This includes oil or grease spills. What can I do in the bathroom?  Use night lights.  Install grab bars by the toilet and in the tub and shower. Do not use towel bars as grab bars.  Use non-skid mats or decals in the tub or shower.  If you need to sit down in the shower, use a plastic, non-slip stool.  Keep the floor dry. Clean up any water that spills on the floor as soon as it happens.  Remove soap buildup in the tub or shower regularly.  Attach bath mats  securely with double-sided non-slip rug tape.  Do not have throw rugs and other things on the floor that can make you trip. What can I do in the bedroom?  Use night lights.  Make sure that you have a light by your bed that is easy to reach.  Do not use any sheets or blankets that are too big for your bed. They should not hang down onto the floor.  Have a firm chair that has side arms. You can use this for support while you get dressed.  Do not have throw rugs and other things on the floor that can make you trip. What can I do in the kitchen?  Clean up any spills right away.  Avoid walking on wet floors.  Keep items that you use a lot in easy-to-reach places.  If you need to reach something above you, use a strong step stool that has a grab bar.  Keep electrical cords out of the way.  Do not use floor polish or wax that makes floors slippery. If you must use wax, use non-skid floor wax.  Do not have throw rugs and other things on the floor that can make you trip. What  can I do with my stairs?  Do not leave any items on the stairs.  Make sure that there are handrails on both sides of the stairs and use them. Fix handrails that are broken or loose. Make sure that handrails are as long as the stairways.  Check any carpeting to make sure that it is firmly attached to the stairs. Fix any carpet that is loose or worn.  Avoid having throw rugs at the top or bottom of the stairs. If you do have throw rugs, attach them to the floor with carpet tape.  Make sure that you have a light switch at the top of the stairs and the bottom of the stairs. If you do not have them, ask someone to add them for you. What else can I do to help prevent falls?  Wear shoes that:  Do not have high heels.  Have rubber bottoms.  Are comfortable and fit you well.  Are closed at the toe. Do not wear sandals.  If you use a stepladder:  Make sure that it is fully opened. Do not climb a closed  stepladder.  Make sure that both sides of the stepladder are locked into place.  Ask someone to hold it for you, if possible.  Clearly mark and make sure that you can see:  Any grab bars or handrails.  First and last steps.  Where the edge of each step is.  Use tools that help you move around (mobility aids) if they are needed. These include:  Canes.  Walkers.  Scooters.  Crutches.  Turn on the lights when you go into a dark area. Replace any light bulbs as soon as they burn out.  Set up your furniture so you have a clear path. Avoid moving your furniture around.  If any of your floors are uneven, fix them.  If there are any pets around you, be aware of where they are.  Review your medicines with your doctor. Some medicines can make you feel dizzy. This can increase your chance of falling. Ask your doctor what other things that you can do to help prevent falls. This information is not intended to replace advice given to you by your health care provider. Make sure you discuss any questions you have with your health care provider. Document Released: 05/19/2009 Document Revised: 12/29/2015 Document Reviewed: 08/27/2014 Elsevier Interactive Patient Education  2017 [employer].

## 2019-12-25 NOTE — Patient Instructions (Signed)
  Dr Leontine Locket [employer] [place] [id number]  [phone]     Increase physical activity 30 mins of physical activity 5 days a week as tolerated

## 2019-12-25 NOTE — Progress Notes (Signed)
Subjective:   Raymond [name] is a 69 y.o. male who presents for Medicare Annual/Subsequent preventive examination.  Review of Systems:   Cardiac Risk Factors include: advanced age (>68men, >73 women);obesity (BMI >30kg/m2);diabetes mellitus;dyslipidemia;hypertension;male gender;family history of premature cardiovascular disease     Objective:    Vitals: BP 128/80 (BP Location: Left Arm, Patient Position: Sitting, Cuff Size: Large)   Temp (!) 96.8 F (36 C) (Temporal)   Ht 6' (1.829 m)   Wt 251 lb (113.9 kg)   SpO2 96%   BMI 34.04 kg/m   Body mass index is 34.04 kg/m.  Advanced Directives 12/25/2019 12/25/2019 07/16/2019 06/22/2019 12/15/2018 12/12/2018 02/13/2018  Does Patient Have a Medical Advance Directive? No No No No No No No  Does patient want to make changes to medical advance directive? Yes (MAU/Ambulatory/Procedural Areas - Information given) Yes (MAU/Ambulatory/Procedural Areas - Information given) - - - - -  Would patient like information on creating a medical advance directive? - - No - Patient declined Yes (MAU/Ambulatory/Procedural Areas - Information given) Yes (MAU/Ambulatory/Procedural Areas - Information given) No - Patient declined No - Patient declined    Tobacco Social History   Tobacco Use  Smoking Status Never Smoker  Smokeless Tobacco Never Used     Counseling given: Not Answered   Clinical Intake:  Pre-visit preparation completed: Yes  Pain : No/denies pain     BMI - recorded: 34.04 Nutritional Status: BMI > 30  Obese Nutritional Risks: None Diabetes: Yes  How often do you need to have someone help you when you read instructions, pamphlets, or other written materials from your doctor or pharmacy?: 1 - Never  Interpreter Needed?: No     Past Medical History:  Diagnosis Date  . Diabetes mellitus without complication ([place])   . Edema   . GERD (gastroesophageal reflux disease)   . Hyperlipidemia   . Hypertension   . Obesity, unspecified    . Problems with sexual function   . Sleep apnea    released per MD, no CPAP  . Unspecified sleep apnea   . Unspecified vitamin D deficiency    Past Surgical History:  Procedure Laterality Date  . CATARACT EXTRACTION  1964   Dr.Freed   . COLONOSCOPY    . HERNIA REPAIR  1987   Right Side, Dr.Kaufman   . POLYPECTOMY     Family History  Problem Relation Age of Onset  . Colon cancer Mother 42  . Breast cancer Sister   . Breast cancer Father   . Ovarian cancer Sister   . Esophageal cancer Neg Hx   . Stomach cancer Neg Hx   . Rectal cancer Neg Hx   . Colon polyps Neg Hx    Social History   Socioeconomic History  . Marital status: Married    Spouse name: Not on file  . Number of children: Not on file  . Years of education: Not on file  . Highest education level: Not on file  Occupational History  . Not on file  Tobacco Use  . Smoking status: Never Smoker  . Smokeless tobacco: Never Used  Substance and Sexual Activity  . Alcohol use: No  . Drug use: No  . Sexual activity: Yes  Other Topics Concern  . Not on file  Social History Narrative  . Not on file   Social Determinants of Health   Financial Resource Strain:   . Difficulty of Paying Living Expenses:   Food Insecurity:   . Worried About Running  Out of Food in the Last Year:   . [place] in the Last Year:   Transportation Needs:   . Lack of Transportation (Medical):   Marland Kitchen Lack of Transportation (Non-Medical):   Physical Activity:   . Days of Exercise per Week:   . Minutes of Exercise per Session:   Stress:   . Feeling of Stress :   Social Connections:   . Frequency of Communication with Friends and Family:   . Frequency of Social Gatherings with Friends and Family:   . Attends Religious Services:   . Active Member of Clubs or Organizations:   . Attends Archivist Meetings:   Marland Kitchen Marital Status:     Outpatient Encounter Medications as of 12/25/2019  Medication Sig  . aspirin 81 MG tablet  Take 81 mg by mouth daily.    Marland Kitchen atorvastatin (LIPITOR) 40 MG tablet TAKE 1 TABLET BY MOUTH EVERYDAY AT BEDTIME  . cholecalciferol (VITAMIN D) 1000 UNITS tablet Take 1,000 Units by mouth daily.   . enalapril (VASOTEC) 5 MG tablet TAKE 1 TABLET BY MOUTH EVERY DAY  . glipiZIDE (GLUCOTROL) 5 MG tablet Take 0.5 tablets (2.5 mg total) by mouth daily before breakfast AND 1 tablet (5 mg total) daily before supper.  Marland Kitchen glucose blood test strip Freestyle Lite Test Strip: Use to check blood sugar 2-3 x weekly Dx: E11.9  . metFORMIN (GLUCOPHAGE) 1000 MG tablet TAKE 1 TABLET (1,000 MG TOTAL) BY MOUTH 2 (TWO) TIMES DAILY WITH A MEAL.   No facility-administered encounter medications on file as of 12/25/2019.    Activities of Daily Living In your present state of health, do you have any difficulty performing the following activities: 12/25/2019  Hearing? N  Vision? N  Difficulty concentrating or making decisions? N  Walking or climbing stairs? N  Dressing or bathing? N  Doing errands, shopping? N  Preparing Food and eating ? N  Using the Toilet? N  In the past six months, have you accidently leaked urine? N  Do you have problems with loss of bowel control? N  Managing your Medications? N  Managing your Finances? N  Housekeeping or managing your Housekeeping? N  Some recent data might be hidden    Patient Care Team: [name], NP as PCP - General (Nurse Practitioner) [name], MD as Consulting Physician (Ophthalmology)   Assessment:   This is a routine wellness examination for [facility].  Exercise Activities and Dietary recommendations Current Exercise Habits: The patient does not participate in regular exercise at present  Goals    . Weight (lb) < 225 lb (102.1 kg)       Fall Risk Fall Risk  12/25/2019 12/25/2019 07/16/2019 06/22/2019 12/15/2018  Falls in the past year? 0 0 0 0 0  Number falls in past yr: 0 0 - 0 0  Injury with Fall? 0 0 - 0 0   Is the patient's home free of  loose throw rugs in walkways, pet beds, electrical cords, etc?   no      Grab bars in the bathroom? yes      Handrails on the stairs?   yes      Adequate lighting?   yes  Timed Get Up and Go Performed: na  Depression Screen PHQ 2/9 Scores 12/25/2019 07/16/2019 06/22/2019 12/15/2018  PHQ - 2 Score 0 0 0 0    Cognitive Function MMSE - Mini Mental State Exam 12/25/2019 01/27/2018  Orientation to time 5 5  Orientation  to Place 5 5  Registration 3 3  Attention/ Calculation 5 5  Recall 2 3  Language- name 2 objects 2 2  Language- repeat 1 1  Language- follow 3 step command 3 3  Language- read & follow direction 1 1  Write a sentence 1 1  Copy design 1 1  Total score 29 30     6CIT Screen 12/15/2018  What Year? 0 points  What month? 0 points  What time? 0 points  Count back from 20 0 points  Months in reverse 0 points  Repeat phrase 0 points  Total Score 0    Immunization History  Administered Date(s) Administered  . Fluad Quad(high Dose 65+) 06/22/2019  . Influenza, High Dose Seasonal PF 08/26/2017  . Influenza,inj,Quad PF,6+ Mos 04/01/2013, 07/12/2014, 05/26/2015, 04/17/2016, 05/30/2018  . PFIZER SARS-COV-2 Vaccination 10/30/2019, 11/20/2019  . Pneumococcal Conjugate-13 07/19/2016  . Pneumococcal Polysaccharide-23 09/20/2011, 01/27/2018  . Td 08/06/2002  . Tdap 12/01/2013  . Zoster 09/16/2012    Qualifies for Shingles Vaccine? Yes, recommend   Screening Tests Health Maintenance  Topic Date Due  . INFLUENZA VACCINE  03/06/2020  . HEMOGLOBIN A1C  05/11/2020  . FOOT EXAM  06/21/2020  . OPHTHALMOLOGY EXAM  07/28/2020  . COLONOSCOPY  07/20/2022  . TETANUS/TDAP  12/02/2023  . COVID-19 Vaccine  Completed  . Hepatitis C Screening  Completed  . PNA vac Low Risk Adult  Completed   Cancer Screenings: Lung: Low Dose CT Chest recommended if Age 73-80 years, 30 pack-year currently smoking OR have quit w/in 15years. Patient does not qualify. Colorectal: due  2023  Additional Screenings:  Hepatitis C Screening:completed       Plan:     I have personally reviewed and noted the following in the patient's chart:   . Medical and social history . Use of alcohol, tobacco or illicit drugs  . Current medications and supplements . Functional ability and status . Nutritional status . Physical activity . Advanced directives . List of other physicians . Hospitalizations, surgeries, and ER visits in previous 12 months . Vitals . Screenings to include cognitive, depression, and falls . Referrals and appointments  In addition, I have reviewed and discussed with patient certain preventive protocols, quality metrics, and best practice recommendations. A written personalized care plan for preventive services as well as general preventive health recommendations were provided to patient.     [name], NP  12/25/2019

## 2019-12-25 NOTE — Progress Notes (Signed)
Careteam: Patient Care Team: [name], NP as PCP - General (Nurse Practitioner) [name], MD as Consulting Physician (Ophthalmology)  PLACE OF SERVICE:  [place] Directive information Does Patient Have a Medical Advance Directive?: No, Does patient want to make changes to medical advance directive?: Yes (MAU/Ambulatory/Procedural Areas - Information given)(Patient already has paperwork)  No Known Allergies  Chief Complaint  Patient presents with  . Medication Management    6 month follow-up   . Abdominal Discomfort    Patient c/o left side abdominal/rib area discomfort      HPI: Patient is a 69 y.o. male for routine follow up. Fasting today  Hyperlipidemia- due for blood work today  htn- at goal on current regimen  DM- A1c at goal. Continues to follow up with endocrinology. Continues to work on Occupational psychologist.   Left sided discomfort- not every day. On and off. Lays on that side and feels like its related to this but continues to lay on side to prevent snoring. Is not a pain but aware of it. Bothered by discomfort a few times a week- notices it on and office.   Following with dermatologist at this time.   Review of Systems:  Review of Systems  Constitutional: Negative for chills, fever and weight loss.  HENT: Negative for tinnitus.   Respiratory: Negative for cough, sputum production and shortness of breath.   Cardiovascular: Negative for chest pain, palpitations and leg swelling.  Gastrointestinal: Negative for abdominal pain, constipation, diarrhea and heartburn.  Genitourinary: Negative for dysuria, frequency and urgency.  Musculoskeletal: Negative for back pain, joint pain and myalgias.  Skin: Negative.   Neurological: Negative for dizziness and headaches.  Psychiatric/Behavioral: Negative for depression and memory loss. The patient does not have insomnia.     Past Medical History:  Diagnosis Date  . Diabetes mellitus without  complication ([place])   . Edema   . GERD (gastroesophageal reflux disease)   . Hyperlipidemia   . Hypertension   . Obesity, unspecified   . Problems with sexual function   . Sleep apnea    released per MD, no CPAP  . Unspecified sleep apnea   . Unspecified vitamin D deficiency    Past Surgical History:  Procedure Laterality Date  . CATARACT EXTRACTION  1964   Dr.Freed   . COLONOSCOPY    . HERNIA REPAIR  1987   Right Side, Dr.Kaufman   . POLYPECTOMY     Social History:   reports that he has never smoked. He has never used smokeless tobacco. He reports that he does not drink alcohol or use drugs.  Family History  Problem Relation Age of Onset  . Colon cancer Mother 54  . Breast cancer Sister   . Breast cancer Father   . Ovarian cancer Sister   . Esophageal cancer Neg Hx   . Stomach cancer Neg Hx   . Rectal cancer Neg Hx   . Colon polyps Neg Hx     Medications: Patient's Medications  New Prescriptions   No medications on file  Previous Medications   ASPIRIN 81 MG TABLET    Take 81 mg by mouth daily.     ATORVASTATIN (LIPITOR) 40 MG TABLET    TAKE 1 TABLET BY MOUTH EVERYDAY AT BEDTIME   CHOLECALCIFEROL (VITAMIN D) 1000 UNITS TABLET    Take 1,000 Units by mouth daily.    ENALAPRIL (VASOTEC) 5 MG TABLET    TAKE 1 TABLET BY MOUTH EVERY DAY  GLIPIZIDE (GLUCOTROL) 5 MG TABLET    Take 0.5 tablets (2.5 mg total) by mouth daily before breakfast AND 1 tablet (5 mg total) daily before supper.   GLUCOSE BLOOD TEST STRIP    Freestyle Lite Test Strip: Use to check blood sugar 2-3 x weekly Dx: E11.9   METFORMIN (GLUCOPHAGE) 1000 MG TABLET    TAKE 1 TABLET (1,000 MG TOTAL) BY MOUTH 2 (TWO) TIMES DAILY WITH A MEAL.  Modified Medications   No medications on file  Discontinued Medications   No medications on file    Physical Exam:  Vitals:   12/25/19 0940  BP: 128/80  Pulse: 71  Temp: (!) 96.8 F (36 C)  TempSrc: Temporal  SpO2: 96%  Weight: 251 lb (113.9 kg)  Height: 6'  (1.829 m)   Body mass index is 34.04 kg/m. Wt Readings from Last 3 Encounters:  12/25/19 251 lb (113.9 kg)  11/10/19 250 lb 6.4 oz (113.6 kg)  08/04/19 267 lb 3.2 oz (121.2 kg)    Physical Exam Constitutional:      General: He is not in acute distress.    Appearance: He is well-developed. He is not diaphoretic.  HENT:     Head: Normocephalic and atraumatic.  Eyes:     Conjunctiva/sclera: Conjunctivae normal.     Pupils: Pupils are equal, round, and reactive to light.  Cardiovascular:     Rate and Rhythm: Normal rate and regular rhythm.     Heart sounds: Normal heart sounds.  Pulmonary:     Effort: Pulmonary effort is normal.     Breath sounds: Normal breath sounds.  Abdominal:     General: Bowel sounds are normal.     Palpations: Abdomen is soft.  Musculoskeletal:        General: No tenderness.     Cervical back: Normal range of motion and neck supple.  Skin:    General: Skin is warm and dry.  Neurological:     Mental Status: He is alert and oriented to person, place, and time.  Psychiatric:        Mood and Affect: Mood normal.        Behavior: Behavior normal.     Labs reviewed: Basic Metabolic Panel: Recent Labs    06/22/19 0948  NA 134*  K 4.3  CL 103  CO2 20  GLUCOSE 145*  BUN 17  CREATININE 1.02  CALCIUM 9.6   Liver Function Tests: Recent Labs    06/22/19 0948  AST 26  ALT 36  BILITOT 0.6  PROT 7.2   No results for input(s): LIPASE, AMYLASE in the last 8760 hours. No results for input(s): AMMONIA in the last 8760 hours. CBC: Recent Labs    06/22/19 0948  WBC 4.8  NEUTROABS 2,261  HGB 14.6  HCT 44.4  MCV 81.6  PLT 238   Lipid Panel: No results for input(s): CHOL, HDL, LDLCALC, TRIG, CHOLHDL, LDLDIRECT in the last 8760 hours. TSH: No results for input(s): TSH in the last 8760 hours. A1C: Lab Results  Component Value Date   HGBA1C 6.3 (A) 11/10/2019     Assessment/Plan 1. Essential hypertension -at goal on enalapril. Continue  on dietary modifications - CBC with Differential/Platelet - COMPLETE METABOLIC PANEL WITH GFR  2. Class 2 severe obesity due to excess calories with serious comorbidity and body mass index (BMI) of 36.0 to 36.9 in adult [facility]) Continues to work on weight reduction, with decrease in calories and increase in physical activity. Encouraged 30 mins 5  days a week   3. Vitamin D deficiency -continues on vit d supplement - Vitamin D, 25-hydroxy  4. Hyperlipidemia, unspecified hyperlipidemia type -will follow up lipid today, continues on lipitor  - Lipid Panel - COMPLETE METABOLIC PANEL WITH GFR  5. Skin lesion -stable, followed by dermatology   6. Type 2 diabetes mellitus with hyperglycemia, without long-term current use of insulin (H[facility]) -A1c at goal. Continues to cut back on sugar and is very active. Continues on metformin 1000 mg BID and glipizide BID. Encouraged dietary compliance, routine foot care/monitoring and to keep up with diabetic eye exams through ophthalmology   Next appt: 6 months.  Carlos American. [place], [facility] [phone]

## 2019-12-26 LAB — CBC WITH DIFFERENTIAL/PLATELET
Absolute Monocytes: 420 cells/uL (ref 200–950)
Basophils Absolute: 8 cells/uL (ref 0–200)
Basophils Relative: 0.2 %
Eosinophils Absolute: 189 cells/uL (ref 15–500)
Eosinophils Relative: 4.5 %
HCT: 42.9 % (ref 38.5–50.0)
Hemoglobin: 14.3 g/dL (ref 13.2–17.1)
Lymphs Abs: 1382 cells/uL (ref 850–3900)
MCH: 27.4 pg (ref 27.0–33.0)
MCHC: 33.3 g/dL (ref 32.0–36.0)
MCV: 82.2 fL (ref 80.0–100.0)
MPV: 10 fL (ref 7.5–12.5)
Monocytes Relative: 10 %
Neutro Abs: 2201 cells/uL (ref 1500–7800)
Neutrophils Relative %: 52.4 %
Platelets: 246 10*3/uL (ref 140–400)
RBC: 5.22 10*6/uL (ref 4.20–5.80)
RDW: 14.2 % (ref 11.0–15.0)
Total Lymphocyte: 32.9 %
WBC: 4.2 10*3/uL (ref 3.8–10.8)

## 2019-12-26 LAB — LIPID PANEL
Cholesterol: 124 mg/dL (ref <200)
HDL: 34 mg/dL — ABNORMAL LOW (ref >=40)
LDL Cholesterol (Calc): 70 mg/dL (calc)
Non-HDL Cholesterol (Calc): 90 mg/dL (calc) (ref <130)
Total CHOL/HDL Ratio: 3.6 (calc) (ref <5.0)
Triglycerides: 117 mg/dL (ref <150)

## 2019-12-26 LAB — VITAMIN D 25 HYDROXY (VIT D DEFICIENCY, FRACTURES): Vit D, 25-Hydroxy: 30 ng/mL (ref 30–100)

## 2019-12-26 LAB — COMPLETE METABOLIC PANEL WITH GFR
AG Ratio: 1.8 (calc) (ref 1.0–2.5)
ALT: 23 U/L (ref 9–46)
AST: 19 U/L (ref 10–35)
Albumin: 4.4 g/dL (ref 3.6–5.1)
Alkaline phosphatase (APISO): 55 U/L (ref 35–144)
BUN: 17 mg/dL (ref 7–25)
CO2: 23 mmol/L (ref 20–32)
Calcium: 9.6 mg/dL (ref 8.6–10.3)
Chloride: 104 mmol/L (ref 98–110)
Creat: 0.89 mg/dL (ref 0.70–1.25)
GFR, Est African American: 103 mL/min/{1.73_m2} (ref >=60)
GFR, Est Non African American: 88 mL/min/{1.73_m2} (ref >=60)
Globulin: 2.4 g/dL (calc) (ref 1.9–3.7)
Glucose, Bld: 101 mg/dL — ABNORMAL HIGH (ref 65–99)
Potassium: 4.3 mmol/L (ref 3.5–5.3)
Sodium: 136 mmol/L (ref 135–146)
Total Bilirubin: 0.7 mg/dL (ref 0.2–1.2)
Total Protein: 6.8 g/dL (ref 6.1–8.1)

## 2020-01-14 ENCOUNTER — Other Ambulatory Visit: Payer: Self-pay | Admitting: Nurse Practitioner

## 2020-01-14 DIAGNOSIS — E785 Hyperlipidemia, unspecified: Secondary | ICD-10-CM

## 2020-01-15 ENCOUNTER — Other Ambulatory Visit: Payer: Self-pay | Admitting: Nurse Practitioner

## 2020-02-24 DIAGNOSIS — L570 Actinic keratosis: Secondary | ICD-10-CM | POA: Diagnosis not present

## 2020-02-24 DIAGNOSIS — L819 Disorder of pigmentation, unspecified: Secondary | ICD-10-CM | POA: Diagnosis not present

## 2020-03-08 ENCOUNTER — Other Ambulatory Visit: Payer: Medicare Other

## 2020-03-08 ENCOUNTER — Other Ambulatory Visit: Payer: Self-pay

## 2020-03-08 DIAGNOSIS — Z20822 Contact with and (suspected) exposure to covid-19: Secondary | ICD-10-CM

## 2020-03-09 LAB — SPECIMEN STATUS REPORT

## 2020-03-09 LAB — NOVEL CORONAVIRUS, NAA: SARS-CoV-2, NAA: NOT DETECTED

## 2020-03-09 LAB — SARS-COV-2, NAA 2 DAY TAT

## 2020-03-10 ENCOUNTER — Ambulatory Visit: Payer: Medicare Other | Admitting: Podiatry

## 2020-03-11 ENCOUNTER — Encounter: Payer: Self-pay | Admitting: Internal Medicine

## 2020-03-11 ENCOUNTER — Other Ambulatory Visit: Payer: Self-pay

## 2020-03-11 ENCOUNTER — Ambulatory Visit: Payer: Medicare Other | Admitting: Internal Medicine

## 2020-03-11 VITALS — BP 142/80 | HR 70 | Ht 72.0 in | Wt 257.6 lb

## 2020-03-11 DIAGNOSIS — E119 Type 2 diabetes mellitus without complications: Secondary | ICD-10-CM

## 2020-03-11 LAB — POCT GLYCOSYLATED HEMOGLOBIN (HGB A1C): Hemoglobin A1C: 6.2 % — AB (ref 4.0–5.6)

## 2020-03-11 MED ORDER — GLIPIZIDE 5 MG PO TABS: 5.0000 mg | ORAL_TABLET | Freq: Every day | ORAL | 3 refills | Status: DC

## 2020-03-11 NOTE — Progress Notes (Signed)
Name: Raymond [name]  Age/ Sex: 69 y.o., male   MRN/ DOB: [id number], Aug 25, 1950     PCP: Raymond Chandler, NP   Reason for Endocrinology Evaluation: Type 2 Diabetes Mellitus  Initial Endocrine Consultative Visit: 08/04/2019    PATIENT IDENTIFIER: Raymond [name] is a 69 y.o. male with a past medical history of T2DM, HTN and OSA. The patient has followed with Endocrinology clinic since 08/04/2019 for consultative assistance with management of his diabetes.  DIABETIC HISTORY:  Raymond [name] was diagnosed with DM in 2014. He has been on Metformin since his diagnosis. His hemoglobin A1c has ranged from 6.5% in 2017, peaking at 8.2% in 2020.  On his initial visit to our clinic his A1c was 8.1%, he was on max dose of metformin, glipizide was added.  SUBJECTIVE:   During the last visit (11/10/2019): A1c 6.3%, continued Metformin, adjusted glipizide  Today (03/11/2020): Raymond [name] is here for a follow up on diabetes management.  He checks his blood sugars 1 times daily. The patient has  had hypoglycemic episodes since the last clinic visit, rare before dinner    HOME DIABETES REGIMEN:  Metformin 1000 mg twice daily Glipizide 5 mg, half a tablet before breakfast and 1 tablet before supper     Statin: Yes ACE-I/ARB: Yes   GLUCOSE LOG: Did not bring   Memory recall this AM 144 mg/dL     DIABETIC COMPLICATIONS: Microvascular complications:    Denies: CKD, retinopathy , neuropathy  Last eye exam: Completed 07/2019  Macrovascular complications:    Denies: CAD, PVD, CVA    HISTORY:  Past Medical History:  Past Medical History:  Diagnosis Date  . Diabetes mellitus without complication ([place])   . Edema   . GERD (gastroesophageal reflux disease)   . Hyperlipidemia   . Hypertension   . Obesity, unspecified   . Problems with sexual function   . Sleep apnea    released per MD, no CPAP  . Unspecified sleep apnea   . Unspecified vitamin D deficiency    Past  Surgical History:  Past Surgical History:  Procedure Laterality Date  . CATARACT EXTRACTION  1964   Dr.Freed   . COLONOSCOPY    . HERNIA REPAIR  1987   Right Side, Dr.Kaufman   . POLYPECTOMY      Social History:  reports that he has never smoked. He has never used smokeless tobacco. He reports that he does not drink alcohol and does not use drugs. Family History:  Family History  Problem Relation Age of Onset  . Colon cancer Mother 53  . Breast cancer Sister   . Breast cancer Father   . Ovarian cancer Sister   . Esophageal cancer Neg Hx   . Stomach cancer Neg Hx   . Rectal cancer Neg Hx   . Colon polyps Neg Hx      HOME MEDICATIONS: Allergies as of 03/11/2020   No Known Allergies     Medication List       Accurate as of March 11, 2020  9:03 AM. If you have any questions, ask your nurse or doctor.        aspirin 81 MG tablet Take 81 mg by mouth daily.   atorvastatin 40 MG tablet Commonly known as: LIPITOR TAKE 1 TABLET BY MOUTH EVERYDAY AT BEDTIME   enalapril 5 MG tablet Commonly known as: VASOTEC TAKE 1 TABLET BY MOUTH EVERY DAY   glipiZIDE 5 MG tablet Commonly  known as: GLUCOTROL Take 0.5 tablets (2.5 mg total) by mouth daily before breakfast AND 1 tablet (5 mg total) daily before supper.   glucose blood test strip Freestyle Lite Test Strip: Use to check blood sugar 2-3 x weekly Dx: E11.9   metFORMIN 1000 MG tablet Commonly known as: GLUCOPHAGE TAKE 1 TABLET (1,000 MG TOTAL) BY MOUTH 2 (TWO) TIMES DAILY WITH A MEAL.   Vitamin D 50 MCG (2000 UT) tablet Take 2,000 Units by mouth daily.        OBJECTIVE:   Vital Signs: BP (!) 142/80   Pulse 70   Ht 6' (1.829 m)   Wt 257 lb 9.6 oz (116.8 kg)   SpO2 95%   BMI 34.94 kg/m   Wt Readings from Last 3 Encounters:  03/11/20 257 lb 9.6 oz (116.8 kg)  12/25/19 251 lb (113.9 kg)  12/25/19 251 lb (113.9 kg)     Exam: General: Pt appears well and is in NAD  Lungs: Clear with good BS bilat with no  rales, rhonchi, or wheezes  Heart: RRR with normal S1 and S2 and no gallops; no murmurs; no rub  Abdomen: Normoactive bowel sounds, soft, nontender, without masses or organomegaly palpable  Extremities: Trace  pretibial edema.  Neuro: MS is good with appropriate affect, pt is alert and Ox3      DM foot exam: 03/11/2020  The skin of the feet is intact without sores or ulcerations. The pedal pulses are 1+ on right and 1+ on left. The sensation is intact to a screening 5.07, 10 gram monofilament bilaterally    DATA REVIEWED:  Lab Results  Component Value Date   HGBA1C 6.3 (A) 11/10/2019   HGBA1C 8.1 (H) 06/18/2019   HGBA1C 8.2 (H) 12/15/2018   Lab Results  Component Value Date   MICROALBUR 0.9 11/10/2019   LDLCALC 70 12/25/2019   CREATININE 0.89 12/25/2019   Lab Results  Component Value Date   MICRALBCREAT 0.5 11/10/2019     Lab Results  Component Value Date   CHOL 124 12/25/2019   HDL 34 (L) 12/25/2019   LDLCALC 70 12/25/2019   TRIG 117 12/25/2019   CHOLHDL 3.6 12/25/2019         ASSESSMENT / PLAN / RECOMMENDATIONS:   1) Type 2 Diabetes Mellitus, Optimally controlled, Without complications, Most recent A1c of 6.2 %. Goal A1c < 7.0 %.      -I have praised the pt on continued optimal glucose control, he tends to have hypoglycemia ( rare) but usually happens during the day despite taking half a tablet of glipizide with breakfast, will stop this and continue the 1 tablet before supper    Plan: MEDICATIONS:  Metformin 1000 mg twice daily  Decrease Glipizide 5 mg, to 1 tablet before supper ( stop morning dose)  EDUCATION / INSTRUCTIONS:  BG monitoring instructions: Patient is instructed to check his blood sugars 1times a day, before breakfast  Call [facility] if: BG persistently < 70  . I reviewed the Rule of 15 for the treatment of hypoglycemia in detail with the patient. Literature supplied.     2) Diabetic complications:   Eye: Does  not have known diabetic retinopathy.   Neuro/ Feet: Does not have known diabetic peripheral neuropathy .   Renal: Patient does not have known baseline CKD. He   is  on an ACEI/ARB at present.     F/U in 6 months   Signed electronically by: Mack Guise, MD  [facility]  [facility] [address] [place], [place] [address] Phone: [phone] FAX: [phone]   CC: Raymond Chandler, NP [place] [address] Phone: [phone]  Fax: [phone]  Return to Endocrinology clinic as below: Future Appointments  Date Time Provider [place]  03/11/2020  9:10 AM Cornesha Radziewicz, Melanie Crazier, MD [facility] None  03/14/2020  8:00 AM Christella Hartigan, RD [place] [facility]  03/24/2020  1:15 PM Gardiner Barefoot, DPM [facility]  06/27/2020  8:30 AM Dewaine Oats Carlos American, NP [facility] None

## 2020-03-11 NOTE — Patient Instructions (Addendum)
-   Continue Metformin 1000 mg Twice daily  - Stop morning Glipizide but continue ONE tablet before supper      HOW TO TREAT LOW BLOOD SUGARS (Blood sugar LESS THAN 70 MG/DL)  Please follow the RULE OF 15 for the treatment of hypoglycemia treatment (when your (blood sugars are less than 70 mg/dL)    STEP 1: Take 15 grams of carbohydrates when your blood sugar is low, which includes:   3-4 GLUCOSE TABS  OR  3-4 OZ OF JUICE OR REGULAR SODA OR  ONE TUBE OF GLUCOSE GEL     STEP 2: RECHECK blood sugar in 15 MINUTES STEP 3: If your blood sugar is still low at the 15 minute recheck --> then, go back to STEP 1 and treat AGAIN with another 15 grams of carbohydrates.

## 2020-03-14 ENCOUNTER — Encounter: Payer: Medicare Other | Attending: Nurse Practitioner | Admitting: Registered"

## 2020-03-14 ENCOUNTER — Other Ambulatory Visit: Payer: Self-pay

## 2020-03-14 ENCOUNTER — Encounter: Payer: Self-pay | Admitting: Registered"

## 2020-03-14 DIAGNOSIS — E1165 Type 2 diabetes mellitus with hyperglycemia: Secondary | ICD-10-CM

## 2020-03-14 NOTE — Patient Instructions (Signed)
The changes you have made are great! Keep up the exercise and tolerated and your plan to get back in the gym Continue eating balanced meals, consider balancing your snacks Continue drinking mostly water

## 2020-03-14 NOTE — Progress Notes (Signed)
Diabetes Self-Management Education  Visit Type: Follow-up  Appt. Start Time: 0810 Appt. End Time: 0840 03/14/2020  [name], identified by name and date of birth, is a 69 y.o. male with a diagnosis of Diabetes:  .   ASSESSMENT  There were no vitals taken for this visit. There is no height or weight on file to calculate BMI.   Pt states changes since last visit he has increased his exercise, walking 45 min daily. Pt states he and his wife retired in July and he is trying to motivate her to also start exercise more with him and plans to get into routine 3x week at gym.   Pt states he feels he is still eating to large of portions sometimes but tries to be more mindful in the evening of his portions. Pt reports he has started choosing green vegetables sides instead of carbs when eating out. Paying attention to the portions in evening meal.  Pt states he feels he will be okay with the holidays coming up and plans to enjoy the foods he in enjoys in moderation.   Diabetes Self-Management Education - 03/14/20 0842      Visit Information   Visit Type Follow-up      Complications   Last HgB A1C per patient/outside source 6.2 %    How often do you check your blood sugar? 1-2 times/day    Fasting Blood glucose range (mg/dL) 70-129   115-120   Number of hypoglycemic episodes per month 2   has not had event since reducing glipizide     Dietary Intake   Breakfast 2 boiled eggs, toast, 1 strip bacon     Lunch Sandwich, deli meat    Dinner [employer] salad grilled chicken    Snack (evening) fruit, cheese, almonds    Beverage(s) 1-2 c coffee, water, sweet tea 1x week      Exercise   Exercise Type Light (walking / raking leaves)    How many days per week to you exercise? 7    How many minutes per day do you exercise? 45    Total minutes per week of exercise 315      Patient Education   Nutrition management  Other (comment)   fruit    Physical activity and exercise  Role of exercise on  diabetes management, blood pressure control and cardiac health.    Medications Reviewed patients medication for diabetes, action, purpose, timing of dose and side effects.      Individualized Goals (developed by patient)   Nutrition General guidelines for healthy choices and portions discussed    Physical Activity Exercise 5-7 days per week    Problem Solving --   plan for holidays     Patient Self-Evaluation of Goals - Patient rates self as meeting previously set goals (% of time)   Nutrition >75%    Physical Activity >75%      Outcomes   Expected Outcomes Demonstrated interest in learning. Expect positive outcomes    Future DMSE 2 months    Program Status Not Completed      Subsequent Visit   Since your last visit have you continued or begun to take your medications as prescribed? Yes   glipizide, metformin   Since your last visit have you had your blood pressure checked? Yes    Is your most recent blood pressure lower, unchanged, or higher since your last visit? Unchanged    Since your last visit, are you checking your blood glucose  at least once a day? Yes           Individualized Plan for Diabetes Self-Management Training:   Learning Objective:  Patient will have a greater understanding of diabetes self-management. Patient education plan is to attend individual and/or group sessions per assessed needs and concerns.   Plan:   Patient Instructions  The changes you have made are great! Keep up the exercise and tolerated and your plan to get back in the gym Continue eating balanced meals, consider balancing your snacks Continue drinking mostly water   Expected Outcomes:  Demonstrated interest in learning. Expect positive outcomes  Education material provided: none  If problems or questions, patient to contact team via:  Phone and Email  Future DSME appointment: 2 months

## 2020-03-24 ENCOUNTER — Encounter: Payer: Self-pay | Admitting: Podiatry

## 2020-03-24 ENCOUNTER — Ambulatory Visit: Payer: Medicare Other | Admitting: Podiatry

## 2020-03-24 ENCOUNTER — Other Ambulatory Visit: Payer: Self-pay

## 2020-03-24 DIAGNOSIS — E1165 Type 2 diabetes mellitus with hyperglycemia: Secondary | ICD-10-CM

## 2020-03-24 DIAGNOSIS — B351 Tinea unguium: Secondary | ICD-10-CM

## 2020-03-24 DIAGNOSIS — M79675 Pain in left toe(s): Secondary | ICD-10-CM | POA: Diagnosis not present

## 2020-03-24 DIAGNOSIS — M79674 Pain in right toe(s): Secondary | ICD-10-CM | POA: Diagnosis not present

## 2020-03-24 NOTE — Progress Notes (Signed)
This patient returns to my office for at risk foot care.  This patient requires this care by a professional since this patient will be at risk due to having diabetes.   He says he has injured his big toenail left foot and it has regrown thick and disfigured,  His big toenail right foot is also thickened.  He has been  experiencing pain in both toes but especially his left great toenail.    This patient is unable to cut nails himself since the patient cannot reach his nails.These nails are painful walking and wearing shoes.  This patient presents for at risk foot care today.  General Appearance  Alert, conversant and in no acute stress.  Vascular  Dorsalis pedis and posterior tibial  pulses are palpable  bilaterally.  Capillary return is within normal limits  bilaterally. Temperature is within normal limits  bilaterally.  Neurologic  Senn-Weinstein monofilament wire test within normal limits  bilaterally. Muscle power within normal limits bilaterally.  Nails Thick disfigured discolored nails with subungual debris  from hallux to fifth toes bilaterally. No evidence of bacterial infection or drainage bilaterally.  Orthopedic  No limitations of motion  feet .  No crepitus or effusions noted.  No bony pathology or digital deformities noted. Mild  HAV  B/L.  Plantar flex fifth metatarsal left foot.  Skin  normotropic skin with no porokeratosis noted bilaterally.  No signs of infections or ulcers noted.     Onychomycosis  Pain in right toes  Pain in left toes.  Porokeratosis sub 5th met left foot.  Consent was obtained for treatment procedures.   Mechanical debridement of nails 1-5  bilaterally performed with a nail nipper.  Filed with dremel without incident.   Return office visit  3 months.                   Told patient to return for periodic foot care and evaluation due to potential at risk complications.   Gardiner Barefoot DPM

## 2020-06-13 ENCOUNTER — Other Ambulatory Visit: Payer: Self-pay

## 2020-06-13 ENCOUNTER — Encounter: Payer: Self-pay | Admitting: Registered"

## 2020-06-13 ENCOUNTER — Encounter: Payer: Medicare Other | Attending: Nurse Practitioner | Admitting: Registered"

## 2020-06-13 DIAGNOSIS — E1165 Type 2 diabetes mellitus with hyperglycemia: Secondary | ICD-10-CM | POA: Insufficient documentation

## 2020-06-13 NOTE — Progress Notes (Signed)
Diabetes Self-Management Education  Visit Type: Follow-up  Appt. Start Time: 0930 Appt. End Time: 1000  06/13/2020  [name], identified by name and date of birth, is a 69 y.o. male with a diagnosis of Diabetes:  .   ASSESSMENT  There were no vitals taken for this visit. There is no height or weight on file to calculate BMI.   Pt states he has not been able to get into the habit of walking 3x week yet but would like to continue with this goal.   Pt states he is working part-time for [employer] and will often go ~5 hours without eating and have feeling of low blood sugar. Pt states he has check blood sugar in the past during similar episodes and it was 56-75 mg/dL.  Pt states he wants ideas for breakfast, currently eating eggs most days. Pt states he sometimes has a hard time resisting ice cream bars in the evening when his wife offers it to him.   Pt reports that some mornings his FBS is elevated and he believes it might be from late night snack.    Diabetes Self-Management Education - 06/13/20 1300      Visit Information   Visit Type Follow-up      Complications   Number of hypoglycemic episodes per month 2   started having again since skipping lunch   Can you tell when your blood sugar is low? Yes      Dietary Intake   Breakfast same - eggs most days, sometimes toast and/or breakfast meat    Dinner meat & potatoes    Snack (evening) cheese      Patient Education   Physical activity and exercise  Role of exercise on diabetes management, blood pressure control and cardiac health.;Other (comment)   helps to build good eating habits too   Medications Reviewed patients medication for diabetes, action, purpose, timing of dose and side effects.   hypoglycemia   Psychosocial adjustment Other (comment)   non-hunger eating     Individualized Goals (developed by patient)   Nutrition General guidelines for healthy choices and portions discussed    Physical Activity Exercise 5-7  days per week      Outcomes   Expected Outcomes Demonstrated interest in learning. Expect positive outcomes    Future DMSE 4-6 wks    Program Status Not Completed      Subsequent Visit   Since your last visit have you continued or begun to take your medications as prescribed? Yes    Since your last visit have you experienced any weight changes? Gain    Weight Gain (lbs) --   a little          Individualized Plan for Diabetes Self-Management Training:   Learning Objective:  Patient will have a greater understanding of diabetes self-management. Patient education plan is to attend individual and/or group sessions per assessed needs and concerns.  Patient Instructions  Goal for walking in the morning at least 3x/week for 30 min Keep snack with you when working, pro + carb Use the flow chart when deciding if and what to eat at night. Consider asking your wife to support you by not offering you ice cream   Expected Outcomes:  Demonstrated interest in learning. Expect positive outcomes  Education material provided: Am I hungry flow sheet, snacks, Nighttime eating  If problems or questions, patient to contact team via:  Phone and Mychart  Future DSME appointment: 4-6 wks

## 2020-06-13 NOTE — Patient Instructions (Signed)
Goal for walking in the morning at least 3x/week for 30 min Keep snack with you when working, pro + carb Use the flow chart when deciding if and what to eat at night. Consider asking your wife to support you by not offering you ice cream

## 2020-06-18 ENCOUNTER — Other Ambulatory Visit: Payer: Self-pay | Admitting: Nurse Practitioner

## 2020-06-18 DIAGNOSIS — E785 Hyperlipidemia, unspecified: Secondary | ICD-10-CM

## 2020-06-27 ENCOUNTER — Other Ambulatory Visit: Payer: Self-pay

## 2020-06-27 ENCOUNTER — Encounter: Payer: Self-pay | Admitting: Nurse Practitioner

## 2020-06-27 ENCOUNTER — Ambulatory Visit (INDEPENDENT_AMBULATORY_CARE_PROVIDER_SITE_OTHER): Payer: Medicare Other | Admitting: Nurse Practitioner

## 2020-06-27 ENCOUNTER — Encounter: Payer: Self-pay | Admitting: Podiatry

## 2020-06-27 ENCOUNTER — Ambulatory Visit: Payer: Medicare Other | Admitting: Podiatry

## 2020-06-27 DIAGNOSIS — B351 Tinea unguium: Secondary | ICD-10-CM | POA: Diagnosis not present

## 2020-06-27 DIAGNOSIS — N3943 Post-void dribbling: Secondary | ICD-10-CM

## 2020-06-27 DIAGNOSIS — I10 Essential (primary) hypertension: Secondary | ICD-10-CM

## 2020-06-27 DIAGNOSIS — Z23 Encounter for immunization: Secondary | ICD-10-CM | POA: Diagnosis not present

## 2020-06-27 DIAGNOSIS — M79675 Pain in left toe(s): Secondary | ICD-10-CM | POA: Diagnosis not present

## 2020-06-27 DIAGNOSIS — E785 Hyperlipidemia, unspecified: Secondary | ICD-10-CM | POA: Diagnosis not present

## 2020-06-27 DIAGNOSIS — N401 Enlarged prostate with lower urinary tract symptoms: Secondary | ICD-10-CM

## 2020-06-27 DIAGNOSIS — M79674 Pain in right toe(s): Secondary | ICD-10-CM

## 2020-06-27 DIAGNOSIS — E1165 Type 2 diabetes mellitus with hyperglycemia: Secondary | ICD-10-CM | POA: Diagnosis not present

## 2020-06-27 DIAGNOSIS — M216X2 Other acquired deformities of left foot: Secondary | ICD-10-CM | POA: Diagnosis not present

## 2020-06-27 NOTE — Progress Notes (Signed)
This patient returns to my office for at risk foot care.  This patient requires this care by a professional since this patient will be at risk due to having diabetes.  This patient is unable to cut nails himself since the patient cannot reach his nails.These nails are painful walking and wearing shoes.  This patient presents for at risk foot care today.  General Appearance  Alert, conversant and in no acute stress.  Vascular  Dorsalis pedis and posterior tibial  pulses are palpable  bilaterally.  Capillary return is within normal limits  bilaterally. Temperature is within normal limits  bilaterally.  Neurologic  Senn-Weinstein monofilament wire test within normal limits  bilaterally. Muscle power within normal limits bilaterally.  Nails Thick disfigured discolored nails with subungual debris  from hallux to fifth toes bilaterally. No evidence of bacterial infection or drainage bilaterally.  Orthopedic  No limitations of motion  feet .  No crepitus or effusions noted.  No bony pathology or digital deformities noted. Plantarflexed fifth met.   Skin  normotropic skin with no porokeratosis noted bilaterally.  No signs of infections or ulcers noted.   Asymptomatic porokeratosis.  Onychomycosis  Pain in right toes  Pain in left toes  Consent was obtained for treatment procedures.   Mechanical debridement of nails 1-5  bilaterally performed with a nail nipper.  Filed with dremel without incident.    Return office visit    3 months                 Told patient to return for periodic foot care and evaluation due to potential at risk complications.   Gardiner Barefoot DPM

## 2020-06-27 NOTE — Progress Notes (Signed)
Careteam: Patient Care Team: [name], NP as PCP - General (Nurse Practitioner) [name], MD as Consulting Physician (Ophthalmology)  PLACE OF SERVICE:  [facility] Directive information Does Patient Have a Medical Advance Directive?: No, Would patient like information on creating a medical advance directive?: Yes (MAU/Ambulatory/Procedural Areas - Information given) ([place] given again)  No Known Allergies  Chief Complaint  Patient presents with  . Medical Management of Chronic Issues    6 month follow-up. Foot exam today. Discuss need for flu vaccine, patient is planning on getting shingrix vaccine today.      HPI: Patient is a 69 y.o. male here today in office for follow up.   DM- a1c at goal. Followed by endocrinology. Continues to work on diet and exercise. Followed by nutrition as well.  Has appt with podiatrist this afternoon Continues on metformin . Callus to bottom of feet Occasional neurpathy.   Getting 2nd shrigrix today  htn- at goal on enalapril and lifestyle modifications.   Hyperlipidemia- continues on lipitor and dietary modifications  Vit d- on supplement.   Review of Systems:  Review of Systems  Constitutional: Negative for chills, fever and weight loss.  HENT: Negative for tinnitus.   Respiratory: Negative for cough, sputum production and shortness of breath.   Cardiovascular: Negative for chest pain, palpitations and leg swelling.  Gastrointestinal: Negative for abdominal pain, constipation, diarrhea and heartburn.  Genitourinary: Positive for frequency. Negative for dysuria and urgency.  Musculoskeletal: Negative for back pain, falls, joint pain and myalgias.  Skin: Negative.   Neurological: Negative for dizziness and headaches.  Psychiatric/Behavioral: Negative for depression and memory loss. The patient does not have insomnia.     Past Medical History:  Diagnosis Date  . Diabetes mellitus without complication  ([place])   . Edema   . GERD (gastroesophageal reflux disease)   . Hyperlipidemia   . Hypertension   . Obesity, unspecified   . Problems with sexual function   . Sleep apnea    released per MD, no CPAP  . Unspecified sleep apnea   . Unspecified vitamin D deficiency    Past Surgical History:  Procedure Laterality Date  . CATARACT EXTRACTION  1964   Dr.Freed   . COLONOSCOPY    . HERNIA REPAIR  1987   Right Side, Dr.Kaufman   . POLYPECTOMY     Social History:   reports that he has never smoked. He has never used smokeless tobacco. He reports that he does not drink alcohol and does not use drugs.  Family History  Problem Relation Age of Onset  . Colon cancer Mother 45  . Breast cancer Sister   . Breast cancer Father   . Ovarian cancer Sister   . Esophageal cancer Neg Hx   . Stomach cancer Neg Hx   . Rectal cancer Neg Hx   . Colon polyps Neg Hx     Medications: Patient's Medications  New Prescriptions   No medications on file  Previous Medications   ASPIRIN 81 MG TABLET    Take 81 mg by mouth daily.     ATORVASTATIN (LIPITOR) 40 MG TABLET    TAKE 1 TABLET BY MOUTH EVERYDAY AT BEDTIME   CHOLECALCIFEROL (VITAMIN D) 50 MCG (2000 UT) TABLET    Take 2,000 Units by mouth daily.   ENALAPRIL (VASOTEC) 5 MG TABLET    TAKE 1 TABLET BY MOUTH EVERY DAY   GLIPIZIDE (GLUCOTROL) 5 MG TABLET    Take 1 tablet (  5 mg total) by mouth daily before supper.   GLUCOSE BLOOD TEST STRIP    Freestyle Lite Test Strip: Use to check blood sugar 2-3 x weekly Dx: E11.9   METFORMIN (GLUCOPHAGE) 1000 MG TABLET    TAKE 1 TABLET (1,000 MG TOTAL) BY MOUTH 2 (TWO) TIMES DAILY WITH A MEAL.  Modified Medications   No medications on file  Discontinued Medications   No medications on file    Physical Exam:  Vitals:   06/27/20 0943  BP: 132/88  Pulse: 72  Temp: (!) 97.1 F (36.2 C)  TempSrc: Temporal  SpO2: 96%  Weight: 261 lb (118.4 kg)  Height: 6' (1.829 m)   Body mass index is 35.4 kg/m. Wt  Readings from Last 3 Encounters:  06/27/20 261 lb (118.4 kg)  03/11/20 257 lb 9.6 oz (116.8 kg)  12/25/19 251 lb (113.9 kg)    Physical Exam Constitutional:      General: He is not in acute distress.    Appearance: He is well-developed. He is not diaphoretic.  HENT:     Head: Normocephalic and atraumatic.     Mouth/Throat:     Pharynx: No oropharyngeal exudate.  Eyes:     Conjunctiva/sclera: Conjunctivae normal.     Pupils: Pupils are equal, round, and reactive to light.  Cardiovascular:     Rate and Rhythm: Normal rate and regular rhythm.     Heart sounds: Normal heart sounds.  Pulmonary:     Effort: Pulmonary effort is normal.     Breath sounds: Normal breath sounds.  Abdominal:     General: Bowel sounds are normal.     Palpations: Abdomen is soft.  Musculoskeletal:        General: No tenderness.     Cervical back: Normal range of motion and neck supple.  Skin:    General: Skin is warm and dry.  Neurological:     Mental Status: He is alert and oriented to person, place, and time.      Labs reviewed: Basic Metabolic Panel: Recent Labs    12/25/19 1021  NA 136  K 4.3  CL 104  CO2 23  GLUCOSE 101*  BUN 17  CREATININE 0.89  CALCIUM 9.6   Liver Function Tests: Recent Labs    12/25/19 1021  AST 19  ALT 23  BILITOT 0.7  PROT 6.8   No results for input(s): LIPASE, AMYLASE in the last 8760 hours. No results for input(s): AMMONIA in the last 8760 hours. CBC: Recent Labs    12/25/19 1021  WBC 4.2  NEUTROABS 2,201  HGB 14.3  HCT 42.9  MCV 82.2  PLT 246   Lipid Panel: Recent Labs    12/25/19 1021  CHOL 124  HDL 34*  LDLCALC 70  TRIG 117  CHOLHDL 3.6   TSH: No results for input(s): TSH in the last 8760 hours. A1C: Lab Results  Component Value Date   HGBA1C 6.2 (A) 03/11/2020     Assessment/Plan 1. Morbid obesity ([place]) -BMI of 35.4 with hypertension, hyperlipidemia and DM. Discussed need for weight loss with proper dietary modifications  and increase in physical activity.  2. Essential hypertension - controlled on enalapril with lifestyle modifications.  - CMP with eGFR([facility])  3. Hyperlipidemia, unspecified hyperlipidemia type -LDL at goal on lipitor, encouraged lifestyle modifications as well. - CMP with eGFR([facility])  4. Type 2 diabetes mellitus with hyperglycemia, without long-term current use of insulin ([facility]) -continues to follow up with endocrine and nutritional services to maintain  A1c at goal.  -Encouraged dietary compliance, routine foot care/monitoring and to keep up with diabetic eye exams through ophthalmology  -will continue medications as prescribed.  - CMP with eGFR([facility]) -has podiatry appt today  5. Benign prostatic hyperplasia with post-void dribbling - PSA  6. Need for influenza vaccination - Flu Vaccine QUAD High Dose(Fluad)  Next appt: 6 months, labs at time of appt. Carlos American. [place], [facility] [phone]

## 2020-06-27 NOTE — Patient Instructions (Signed)
Go on youtube for exercises  Example- say yes to next series   Follow up in 6 months- will get fasting labs at this appt

## 2020-06-28 LAB — COMPLETE METABOLIC PANEL WITH GFR
AG Ratio: 1.8 (calc) (ref 1.0–2.5)
ALT: 35 U/L (ref 9–46)
AST: 24 U/L (ref 10–35)
Albumin: 4.4 g/dL (ref 3.6–5.1)
Alkaline phosphatase (APISO): 47 U/L (ref 35–144)
BUN: 16 mg/dL (ref 7–25)
CO2: 22 mmol/L (ref 20–32)
Calcium: 9.3 mg/dL (ref 8.6–10.3)
Chloride: 105 mmol/L (ref 98–110)
Creat: 1.01 mg/dL (ref 0.70–1.25)
GFR, Est African American: 88 mL/min/{1.73_m2} (ref >=60)
GFR, Est Non African American: 76 mL/min/{1.73_m2} (ref >=60)
Globulin: 2.5 g/dL (calc) (ref 1.9–3.7)
Glucose, Bld: 112 mg/dL — ABNORMAL HIGH (ref 65–99)
Potassium: 4.5 mmol/L (ref 3.5–5.3)
Sodium: 136 mmol/L (ref 135–146)
Total Bilirubin: 0.8 mg/dL (ref 0.2–1.2)
Total Protein: 6.9 g/dL (ref 6.1–8.1)

## 2020-06-28 LAB — PSA: PSA: 4.49 ng/mL — ABNORMAL HIGH (ref <=4.0)

## 2020-07-05 ENCOUNTER — Other Ambulatory Visit: Payer: Self-pay | Admitting: Nurse Practitioner

## 2020-07-05 DIAGNOSIS — R972 Elevated prostate specific antigen [PSA]: Secondary | ICD-10-CM

## 2020-07-05 DIAGNOSIS — Z8042 Family history of malignant neoplasm of prostate: Secondary | ICD-10-CM

## 2020-07-05 DIAGNOSIS — N3943 Post-void dribbling: Secondary | ICD-10-CM

## 2020-07-14 ENCOUNTER — Other Ambulatory Visit: Payer: Self-pay | Admitting: Nurse Practitioner

## 2020-07-18 ENCOUNTER — Other Ambulatory Visit: Payer: Self-pay

## 2020-07-18 ENCOUNTER — Ambulatory Visit: Payer: Medicare Other | Admitting: Urology

## 2020-07-18 ENCOUNTER — Encounter: Payer: Self-pay | Admitting: Urology

## 2020-07-18 VITALS — BP 132/87 | HR 76 | Ht 72.0 in | Wt 245.0 lb

## 2020-07-18 DIAGNOSIS — N400 Enlarged prostate without lower urinary tract symptoms: Secondary | ICD-10-CM

## 2020-07-18 DIAGNOSIS — N5201 Erectile dysfunction due to arterial insufficiency: Secondary | ICD-10-CM

## 2020-07-18 DIAGNOSIS — R972 Elevated prostate specific antigen [PSA]: Secondary | ICD-10-CM | POA: Diagnosis not present

## 2020-07-18 DIAGNOSIS — N401 Enlarged prostate with lower urinary tract symptoms: Secondary | ICD-10-CM | POA: Diagnosis not present

## 2020-07-18 LAB — MICROSCOPIC EXAMINATION
Bacteria, UA: NONE SEEN
Epithelial Cells (non renal): NONE SEEN /hpf (ref 0–10)
RBC, Urine: NONE SEEN /hpf (ref 0–2)

## 2020-07-18 LAB — URINALYSIS, COMPLETE
Bilirubin, UA: NEGATIVE
Glucose, UA: NEGATIVE
Ketones, UA: NEGATIVE
Leukocytes,UA: NEGATIVE
Nitrite, UA: NEGATIVE
Protein,UA: NEGATIVE
RBC, UA: NEGATIVE
Specific Gravity, UA: >1.03 — ABNORMAL HIGH (ref 1.005–1.030)
Urobilinogen, Ur: 0.2 mg/dL (ref 0.2–1.0)
pH, UA: 5 (ref 5.0–7.5)

## 2020-07-18 LAB — BLADDER SCAN AMB NON-IMAGING: Scan Result: 25

## 2020-07-18 MED ORDER — TAMSULOSIN HCL 0.4 MG PO CAPS: 0.4000 mg | ORAL_CAPSULE | Freq: Every day | ORAL | 0 refills | Status: DC

## 2020-07-18 MED ORDER — SILDENAFIL CITRATE 100 MG PO TABS: ORAL_TABLET | ORAL | 0 refills | Status: AC

## 2020-07-18 NOTE — Progress Notes (Signed)
07/18/2020 9:15 AM   Raymond [name] April 15, 1951 [id number]  Referring provider: [name], NP [place],  [place] [address]  Chief Complaint  Patient presents with  . Benign Prostatic Hypertrophy    HPI: Raymond [name] is a 69 y.o. male seen at the request of Raymond Mustache, NP for evaluation of BPH and a mild PSA elevation.   Mild to moderate LUTS including sensation incomplete emptying, frequency, intermittent urinary stream, nocturia x1-2 and postvoid dribbling  IPSS 9/35  Denies dysuria, gross hematuria  Denies flank, abdominal or pelvic pain  PSA 4.49  Family history of prostate cancer but not in first-degree relatives  Also wanted discussed ED  Difficulty keeping and maintaining an erection  Organic risk factors include diabetes, hypertension, hyperlipidemia and antihypertensive medication; no prior tobacco use       PMH: Past Medical History:  Diagnosis Date  . Diabetes mellitus without complication ([place])   . Edema   . GERD (gastroesophageal reflux disease)   . Hyperlipidemia   . Hypertension   . Obesity, unspecified   . Problems with sexual function   . Sleep apnea    released per MD, no CPAP  . Unspecified sleep apnea   . Unspecified vitamin D deficiency     Surgical History: Past Surgical History:  Procedure Laterality Date  . CATARACT EXTRACTION  1964   Dr.Freed   . COLONOSCOPY    . HERNIA REPAIR  1987   Right Side, Dr.Kaufman   . POLYPECTOMY      Home Medications:  Allergies as of 07/18/2020   No Known Allergies     Medication List       Accurate as of July 18, 2020  9:15 AM. If you have any questions, ask your nurse or doctor.        aspirin 81 MG tablet Take 81 mg by mouth daily.   atorvastatin 40 MG tablet Commonly known as: LIPITOR TAKE 1 TABLET BY MOUTH EVERYDAY AT BEDTIME   enalapril 5 MG tablet Commonly known as: VASOTEC TAKE 1 TABLET BY MOUTH EVERY DAY   glipiZIDE 5 MG  tablet Commonly known as: GLUCOTROL Take 1 tablet (5 mg total) by mouth daily before supper.   glucose blood test strip Freestyle Lite Test Strip: Use to check blood sugar 2-3 x weekly Dx: E11.9   metFORMIN 1000 MG tablet Commonly known as: GLUCOPHAGE TAKE 1 TABLET (1,000 MG TOTAL) BY MOUTH 2 (TWO) TIMES DAILY WITH A MEAL.   Vitamin D 50 MCG (2000 UT) tablet Take 2,000 Units by mouth daily.       Allergies: No Known Allergies  Family History: Family History  Problem Relation Age of Onset  . Colon cancer Mother 43  . Breast cancer Sister   . Breast cancer Father   . Ovarian cancer Sister   . Esophageal cancer Neg Hx   . Stomach cancer Neg Hx   . Rectal cancer Neg Hx   . Colon polyps Neg Hx     Social History:  reports that he has never smoked. He has never used smokeless tobacco. He reports that he does not drink alcohol and does not use drugs.   Physical Exam: There were no vitals taken for this visit.  Constitutional:  Alert, No acute distress. HEENT: [place] AT, moist mucus membranes.  Trachea midline, no masses. Cardiovascular: No clubbing, cyanosis, or edema. Respiratory: Normal respiratory effort, no increased work of breathing. GI: Abdomen is soft, nontender, nondistended, no abdominal masses  GU: Phallus without lesions, testes descended bilaterally without masses or tenderness; prostate 40 g, smooth without nodules Skin: No rashes, bruises or suspicious lesions. Neurologic: Grossly intact, no focal deficits, moving all 4 extremities. Psychiatric: Normal mood and affect.   Assessment & Plan:    1.  Elevated PSA  Although PSA is a prostate cancer screening test he was informed that cancer is not the most common cause of an elevated PSA. Other potential causes including BPH and inflammation were discussed. He was informed that the only way to adequately diagnose prostate cancer would be a transrectal ultrasound and biopsy of the prostate. The procedure was discussed  including potential risks of bleeding and infection/sepsis. He was also informed that a negative biopsy does not conclusively rule out the possibility that prostate cancer may be present and that continued monitoring is required. The use of newer adjunctive blood tests including PHI and 4kScore were discussed. The use of multiparametric prostate MRI was also discussed however is not typically used for initial evaluation of an elevated PSA. Continued periodic surveillance was also discussed.  Since PSA mildly elevated will initially treat with a 30-day course tamsulosin and schedule follow-up lab visit for repeat PSA  He will think over these options if repeat PSA remains elevated   2. Benign prostatic hyperplasia with LUTS  Mild to moderate lower urinary tract symptoms  States symptoms are currently not bothersome enough that he desires medication  30-day course tamsulosin as above and if he notes significant improvement will give the option of continuing this medication  Bladder scan PVR 25 mL  3.  Erectile dysfunction  He was interested in a PDE 5 inhibitor trial and Rx sildenafil 100 mg sent   Raymond Sons, MD  [facility] [address], [place], [place] [address] [phone]

## 2020-08-09 ENCOUNTER — Other Ambulatory Visit: Payer: Self-pay | Admitting: Urology

## 2020-08-17 ENCOUNTER — Other Ambulatory Visit: Payer: Self-pay | Admitting: *Deleted

## 2020-08-17 DIAGNOSIS — N401 Enlarged prostate with lower urinary tract symptoms: Secondary | ICD-10-CM

## 2020-08-18 ENCOUNTER — Other Ambulatory Visit: Payer: Self-pay

## 2020-08-18 ENCOUNTER — Other Ambulatory Visit: Payer: PPO

## 2020-08-18 DIAGNOSIS — N401 Enlarged prostate with lower urinary tract symptoms: Secondary | ICD-10-CM | POA: Diagnosis not present

## 2020-08-19 LAB — PSA: Prostate Specific Ag, Serum: 5.8 ng/mL — ABNORMAL HIGH (ref 0.0–4.0)

## 2020-08-21 ENCOUNTER — Telehealth: Payer: Self-pay | Admitting: Urology

## 2020-08-21 NOTE — Telephone Encounter (Signed)
Repeat PSA higher at 5.8.  Recommend scheduling prostate biopsy or prostate MRI

## 2020-08-23 NOTE — Telephone Encounter (Signed)
Patient called back.  He is wanting more information on the prostate biopsy vs. Prostate MRI.  He is not sure which way to proceed.    Call routed to clinical.

## 2020-08-23 NOTE — Telephone Encounter (Signed)
Notified patient as instructed, patient pleased.Patient would like prostate MRI done

## 2020-08-24 NOTE — Telephone Encounter (Signed)
A prostate MRI can identify areas that may be suspicious for high-grade prostate cancer and specialized biopsy can be performed to directly target these areas.  An MRI however can be normal and prostate cancer can still be present.  Based on the rise of his PSA if he had a negative MRI I would still recommend a standard prostate biopsy.

## 2020-08-29 DIAGNOSIS — L819 Disorder of pigmentation, unspecified: Secondary | ICD-10-CM | POA: Diagnosis not present

## 2020-08-29 DIAGNOSIS — L570 Actinic keratosis: Secondary | ICD-10-CM | POA: Diagnosis not present

## 2020-08-29 DIAGNOSIS — L814 Other melanin hyperpigmentation: Secondary | ICD-10-CM | POA: Diagnosis not present

## 2020-08-29 DIAGNOSIS — L821 Other seborrheic keratosis: Secondary | ICD-10-CM | POA: Diagnosis not present

## 2020-08-29 DIAGNOSIS — D1801 Hemangioma of skin and subcutaneous tissue: Secondary | ICD-10-CM | POA: Diagnosis not present

## 2020-08-29 DIAGNOSIS — B351 Tinea unguium: Secondary | ICD-10-CM | POA: Diagnosis not present

## 2020-08-29 DIAGNOSIS — D229 Melanocytic nevi, unspecified: Secondary | ICD-10-CM | POA: Diagnosis not present

## 2020-08-29 DIAGNOSIS — L728 Other follicular cysts of the skin and subcutaneous tissue: Secondary | ICD-10-CM | POA: Diagnosis not present

## 2020-09-02 ENCOUNTER — Other Ambulatory Visit: Payer: Self-pay | Admitting: Urology

## 2020-09-05 ENCOUNTER — Other Ambulatory Visit: Payer: Self-pay | Admitting: Urology

## 2020-09-05 DIAGNOSIS — R972 Elevated prostate specific antigen [PSA]: Secondary | ICD-10-CM

## 2020-09-12 ENCOUNTER — Other Ambulatory Visit: Payer: Self-pay | Admitting: Nurse Practitioner

## 2020-09-12 ENCOUNTER — Encounter: Payer: Self-pay | Admitting: Internal Medicine

## 2020-09-12 ENCOUNTER — Ambulatory Visit: Payer: PPO | Admitting: Internal Medicine

## 2020-09-12 ENCOUNTER — Other Ambulatory Visit: Payer: Self-pay

## 2020-09-12 ENCOUNTER — Ambulatory Visit: Payer: Medicare Other | Admitting: Internal Medicine

## 2020-09-12 ENCOUNTER — Encounter: Payer: Self-pay | Admitting: Registered"

## 2020-09-12 ENCOUNTER — Encounter: Payer: PPO | Attending: Nurse Practitioner | Admitting: Registered"

## 2020-09-12 VITALS — BP 136/86 | HR 81 | Ht 72.0 in | Wt 265.1 lb

## 2020-09-12 DIAGNOSIS — E119 Type 2 diabetes mellitus without complications: Secondary | ICD-10-CM

## 2020-09-12 DIAGNOSIS — E1165 Type 2 diabetes mellitus with hyperglycemia: Secondary | ICD-10-CM | POA: Diagnosis not present

## 2020-09-12 LAB — POCT GLUCOSE (DEVICE FOR HOME USE): POC Glucose: 150 mg/dl — AB (ref 70–99)

## 2020-09-12 LAB — POCT GLYCOSYLATED HEMOGLOBIN (HGB A1C): Hemoglobin A1C: 6.6 % — AB (ref 4.0–5.6)

## 2020-09-12 MED ORDER — GLIPIZIDE 5 MG PO TABS: ORAL_TABLET | ORAL | 3 refills | Status: DC

## 2020-09-12 MED ORDER — METFORMIN HCL 1000 MG PO TABS: 1000.0000 mg | ORAL_TABLET | Freq: Two times a day (BID) | ORAL | 3 refills | Status: DC

## 2020-09-12 NOTE — Progress Notes (Signed)
Name: Raymond [name]  Age/ Sex: 70 y.o., male   MRN/ DOB: [id number], 11-Nov-1950     PCP: [name], NP   Reason for Endocrinology Evaluation: Type 2 Diabetes Mellitus  Initial Endocrine Consultative Visit: 08/04/2019    PATIENT IDENTIFIER: Raymond [name] is a 70 y.o. male with a past medical history of T2DM, HTN and OSA. The patient has followed with Endocrinology clinic since 08/04/2019 for consultative assistance with management of his diabetes.  DIABETIC HISTORY:  Raymond [name] was diagnosed with DM in 2014. He has been on Metformin since his diagnosis. His hemoglobin A1c has ranged from 6.5% in 2017, peaking at 8.2% in 2020.  On his initial visit to our clinic his A1c was 8.1%, he was on max dose of metformin, glipizide was added.  SUBJECTIVE:   During the last visit (03/11/2020): A1c 6.2%, continued Metformin, and decreased  Glipizide      Today (09/12/2020): Raymond [name] is here for a follow up on diabetes management.  He checks his blood sugars 1 times daily. The patient has  had hypoglycemic episodes since the last clinic visit, rare before dinner    He has been following with urology for BPH and elevated PSA.  Has been seeing podiatry    HOME DIABETES REGIMEN:  Metformin 1000 mg twice daily Glipizide 5 mg, 1 tablet before supper- still doing half a tablet before Breakfast in addition to 1 tablet before Supper      Statin: Yes ACE-I/ARB: Yes   GLUCOSE LOG: Did not bring   Memory recall this 130's mg/dL    DIABETIC COMPLICATIONS: Microvascular complications:    Denies: CKD, retinopathy , neuropathy  Last eye exam: Completed 07/2019- missed 2021 appointment   Macrovascular complications:    Denies: CAD, PVD, CVA    HISTORY:  Past Medical History:  Past Medical History:  Diagnosis Date  . Diabetes mellitus without complication ([place])   . Edema   . GERD (gastroesophageal reflux disease)   . Hyperlipidemia   . Hypertension   .  Obesity, unspecified   . Problems with sexual function   . Sleep apnea    released per MD, no CPAP  . Unspecified sleep apnea   . Unspecified vitamin D deficiency    Past Surgical History:  Past Surgical History:  Procedure Laterality Date  . CATARACT EXTRACTION  1964   Dr.Freed   . COLONOSCOPY    . HERNIA REPAIR  1987   Right Side, Dr.Kaufman   . POLYPECTOMY      Social History:  reports that he has never smoked. He has never used smokeless tobacco. He reports that he does not drink alcohol and does not use drugs. Family History:  Family History  Problem Relation Age of Onset  . Colon cancer Mother 9  . Breast cancer Sister   . Breast cancer Father   . Ovarian cancer Sister   . Esophageal cancer Neg Hx   . Stomach cancer Neg Hx   . Rectal cancer Neg Hx   . Colon polyps Neg Hx      HOME MEDICATIONS: Allergies as of 09/12/2020   No Known Allergies     Medication List       Accurate as of September 12, 2020  9:01 AM. If you have any questions, ask your nurse or doctor.        aspirin 81 MG tablet Take 81 mg by mouth daily.   atorvastatin 40 MG tablet  Commonly known as: LIPITOR TAKE 1 TABLET BY MOUTH EVERYDAY AT BEDTIME   enalapril 5 MG tablet Commonly known as: VASOTEC TAKE 1 TABLET BY MOUTH EVERY DAY   FreeStyle Lite Devi by Does not apply route.   glipiZIDE 5 MG tablet Commonly known as: GLUCOTROL Take 1 tablet (5 mg total) by mouth daily before supper. What changed:   how much to take  when to take this   glucose blood test strip Freestyle Lite Test Strip: Use to check blood sugar 2-3 x weekly Dx: E11.9   metFORMIN 1000 MG tablet Commonly known as: GLUCOPHAGE TAKE 1 TABLET (1,000 MG TOTAL) BY MOUTH 2 (TWO) TIMES DAILY WITH A MEAL.   sildenafil 100 MG tablet Commonly known as: VIAGRA 0.5-1 tab 1 hour prior to intercourse   tamsulosin 0.4 MG Caps capsule Commonly known as: FLOMAX TAKE 1 CAPSULE BY MOUTH EVERY DAY   Vitamin D 50 MCG (2000  UT) tablet Take 2,000 Units by mouth daily.        OBJECTIVE:   Vital Signs: BP 136/86   Pulse 81   Ht 6' (1.829 m)   Wt 265 lb 2 oz (120.3 kg)   SpO2 97%   BMI 35.96 kg/m   Wt Readings from Last 3 Encounters:  09/12/20 265 lb 2 oz (120.3 kg)  07/18/20 245 lb (111.1 kg)  06/27/20 261 lb (118.4 kg)     Exam: General: Pt appears well and is in NAD  Lungs: Clear with good BS bilat with no rales, rhonchi, or wheezes  Heart: RRR with normal S1 and S2 and no gallops; no murmurs; no rub  Abdomen: Normoactive bowel sounds, soft, nontender, without masses or organomegaly palpable  Extremities: 1+  pretibial edema.  Neuro: MS is good with appropriate affect, pt is alert and Ox3      DM foot exam: 09/12/2020  The skin of the feet is intact without sores or ulcerations. The pedal pulses are 1+ on right and 1+ on left. The sensation is decreased to a screening 5.07, 10 gram monofilament bilaterally     DATA REVIEWED:  Lab Results  Component Value Date   HGBA1C 6.6 (A) 09/12/2020   HGBA1C 6.2 (A) 03/11/2020   HGBA1C 6.3 (A) 11/10/2019   Lab Results  Component Value Date   MICROALBUR 0.9 11/10/2019   LDLCALC 70 12/25/2019   CREATININE 1.01 06/27/2020   Lab Results  Component Value Date   MICRALBCREAT 0.5 11/10/2019     Lab Results  Component Value Date   CHOL 124 12/25/2019   HDL 34 (L) 12/25/2019   LDLCALC 70 12/25/2019   TRIG 117 12/25/2019   CHOLHDL 3.6 12/25/2019        In- Office BG 150 mg/dL    ASSESSMENT / PLAN / RECOMMENDATIONS:   1) Type 2 Diabetes Mellitus, Optimally controlled, With Neuropathic  complications, Most recent A1c of 6.6 %. Goal A1c < 7.0 %.    - A1c continues to be at goal but it has increased from 6.2% to 6.6 % despite not discontinuing the morning dose of Glipizide. I have emphasized the importance of low carb diet .  - No changes today  MEDICATIONS:  Continue Metformin 1000 mg twice daily  Continue  Glipizide 5 mg, half a  tablet before Breakfast and 1 tablet before supper  EDUCATION / INSTRUCTIONS:  BG monitoring instructions: Patient is instructed to check his blood sugars 1times a day, before breakfast  Call [facility] if: BG persistently <  70  . I reviewed the Rule of 15 for the treatment of hypoglycemia in detail with the patient. Literature supplied.     2) Diabetic complications:   Eye: Does not have known diabetic retinopathy.   Neuro/ Feet: Does have known diabetic peripheral neuropathy ( based on foot exam 09/2020)  .   Renal: Patient does not have known baseline CKD. He   is  on an ACEI/ARB at present.     F/U in 6 months   Signed electronically by: Mack Guise, MD  [facility]  [facility] [address] [place],  [address] Phone: [phone] FAX: [phone]   CC: [name], NP [place] [address] Phone: [phone]  Fax: [phone]  Return to Endocrinology clinic as below: Future Appointments  Date Time Provider [place]  09/12/2020  9:30 AM Christella Hartigan, RD [place] [facility]  09/13/2020 10:00 AM [facility]  10/03/2020 11:15 AM Gardiner Barefoot, DPM [facility]  12/21/2020  8:30 AM [name], NP [facility] None

## 2020-09-12 NOTE — Patient Instructions (Addendum)
Goals:  Increase your activity level, consider participating in the research study.  Re-think your Nighttime routine and how snacking plays a part in it. Consider involving your wife in your goas

## 2020-09-12 NOTE — Progress Notes (Signed)
Diabetes Self-Management Education  Visit Type: Follow-up  Appt. Start Time: 0930 Appt. End Time: 1000  09/12/2020  [name], identified by name and date of birth, is a 70 y.o. male with a diagnosis of Diabetes:  .   ASSESSMENT  There were no vitals taken for this visit. There is no height or weight on file to calculate BMI.   Per lab in chart A1c is 6.6% today, up from 6.2% 6 months ago. Patient states he had been eating more sweets during the Christmas holiday. Pt reports he continue to bring cookies and chips into the house, but no more ice cream. Pt states he is willing to consider changing his nighttime routine so he is not eating snack food while watching TV.  Pt also states he has not increased his physical activity since last visit. Pt states his plan was to go back to the gym but is still not willing to be in that environment with COVID concerns.  Pt reports his schedule is: wakes up 7:30-8 am; eating dinner 5-6 pm and awake until midnight watching TV.   Diabetes Self-Management Education - 09/12/20 0900      Visit Information   Visit Type Follow-up      Complications   Last HgB A1C per patient/outside source 6.6 %      Dietary Intake   Breakfast 1 sausage patty, fried egg, ww toast, coffee, cream & sugar    Lunch sandwich, deli meat ww toast    Dinner beef, cabbage, brussle sprouts, broccoli, a little potato salad    Snack (evening) cookies and chips on occasion      Exercise   Exercise Type ADL's      Patient Education   Nutrition management  Other (comment)   environmental and social cues for snacking   Physical activity and exercise  Other (comment)   research project for motivation for exercise   Psychosocial adjustment Other (comment)   effect of watching TV late at night and sleep     Individualized Goals (developed by patient)   Nutrition Other (comment)   reduce snacking at night   Physical Activity Exercise 3-5 times per week   enroll in 12-week  research project   Problem Solving --   enlist wife in new nighttime structure/habit building     Patient Self-Evaluation of Goals - Patient rates self as meeting previously set goals (% of time)   Physical Activity 25 - 50%      Outcomes   Expected Outcomes Demonstrated interest in learning. Expect positive outcomes    Future DMSE 6 months    Program Status Not Completed           Individualized Plan for Diabetes Self-Management Training:   Learning Objective:  Patient will have a greater understanding of diabetes self-management. Patient education plan is to attend individual and/or group sessions per assessed needs and concerns.   Patient Instructions  Goals:  Increase your activity level, consider participating in the research study.  Re-think your Nighttime routine and how snacking plays a part in it. Consider involving your wife in your goas   Expected Outcomes:  Demonstrated interest in learning. Expect positive outcomes  Education material provided: none  If problems or questions, patient to contact team via:  Phone and Mychart  Future DSME appointment: 6 months

## 2020-09-12 NOTE — Patient Instructions (Signed)
-   Continue Metformin 1000 mg Twice daily  - Continue Glipizide 5 mg, HALF  a tablet before Breakfast and ONE tablet before supper      HOW TO TREAT LOW BLOOD SUGARS (Blood sugar LESS THAN 70 MG/DL)  Please follow the RULE OF 15 for the treatment of hypoglycemia treatment (when your (blood sugars are less than 70 mg/dL)    STEP 1: Take 15 grams of carbohydrates when your blood sugar is low, which includes:   3-4 GLUCOSE TABS  OR  3-4 OZ OF JUICE OR REGULAR SODA OR  ONE TUBE OF GLUCOSE GEL     STEP 2: RECHECK blood sugar in 15 MINUTES STEP 3: If your blood sugar is still low at the 15 minute recheck --> then, go back to STEP 1 and treat AGAIN with another 15 grams of carbohydrates.

## 2020-09-12 NOTE — Telephone Encounter (Signed)
Diabetes managed by endocrinologist

## 2020-09-13 ENCOUNTER — Ambulatory Visit
Admission: RE | Admit: 2020-09-13 | Discharge: 2020-09-13 | Disposition: A | Discharge status: Home / Self Care | Payer: PPO | Source: Ambulatory Visit | Attending: Urology | Admitting: Urology

## 2020-09-13 ENCOUNTER — Other Ambulatory Visit: Payer: Self-pay

## 2020-09-13 DIAGNOSIS — R972 Elevated prostate specific antigen [PSA]: Secondary | ICD-10-CM | POA: Diagnosis not present

## 2020-09-13 DIAGNOSIS — R590 Localized enlarged lymph nodes: Secondary | ICD-10-CM | POA: Diagnosis not present

## 2020-09-13 DIAGNOSIS — N400 Enlarged prostate without lower urinary tract symptoms: Secondary | ICD-10-CM | POA: Diagnosis not present

## 2020-09-13 DIAGNOSIS — C61 Malignant neoplasm of prostate: Secondary | ICD-10-CM | POA: Diagnosis not present

## 2020-09-13 MED ORDER — GADOBUTROL 1 MMOL/ML IV SOLN
10.0000 mL | Freq: Once | INTRAVENOUS | Status: AC | PRN
  Administered 2020-09-13: 10 mL via INTRAVENOUS

## 2020-09-15 ENCOUNTER — Encounter: Payer: Self-pay | Admitting: Urology

## 2020-09-22 DIAGNOSIS — H40013 Open angle with borderline findings, low risk, bilateral: Secondary | ICD-10-CM | POA: Diagnosis not present

## 2020-09-22 DIAGNOSIS — H2512 Age-related nuclear cataract, left eye: Secondary | ICD-10-CM | POA: Diagnosis not present

## 2020-09-22 DIAGNOSIS — H524 Presbyopia: Secondary | ICD-10-CM | POA: Diagnosis not present

## 2020-09-22 DIAGNOSIS — E119 Type 2 diabetes mellitus without complications: Secondary | ICD-10-CM | POA: Diagnosis not present

## 2020-09-22 DIAGNOSIS — H25012 Cortical age-related cataract, left eye: Secondary | ICD-10-CM | POA: Diagnosis not present

## 2020-09-22 LAB — HM DIABETES EYE EXAM

## 2020-09-26 ENCOUNTER — Encounter: Payer: Self-pay | Admitting: *Deleted

## 2020-09-26 ENCOUNTER — Telehealth: Payer: Self-pay | Admitting: *Deleted

## 2020-09-26 NOTE — Telephone Encounter (Signed)
Tried to schedule standard prostate biopsy in office . Patient give me another number to call and no one answered

## 2020-09-26 NOTE — Telephone Encounter (Signed)
Talked with patient and went over prostate biopsy instructions with patient. Send pcp a clearance to stop aspirin .

## 2020-09-29 ENCOUNTER — Ambulatory Visit: Payer: Medicare Other | Admitting: Podiatry

## 2020-10-03 ENCOUNTER — Ambulatory Visit: Payer: PPO | Admitting: Podiatry

## 2020-10-03 ENCOUNTER — Other Ambulatory Visit: Payer: Self-pay

## 2020-10-03 ENCOUNTER — Encounter: Payer: Self-pay | Admitting: Podiatry

## 2020-10-03 DIAGNOSIS — B351 Tinea unguium: Secondary | ICD-10-CM

## 2020-10-03 DIAGNOSIS — M79674 Pain in right toe(s): Secondary | ICD-10-CM

## 2020-10-03 DIAGNOSIS — M79675 Pain in left toe(s): Secondary | ICD-10-CM | POA: Diagnosis not present

## 2020-10-03 DIAGNOSIS — E1165 Type 2 diabetes mellitus with hyperglycemia: Secondary | ICD-10-CM | POA: Diagnosis not present

## 2020-10-03 NOTE — Progress Notes (Signed)
This patient returns to my office for at risk foot care.  This patient requires this care by a professional since this patient will be at risk due to having diabetes.  This patient is unable to cut nails himself since the patient cannot reach his nails.These nails are painful walking and wearing shoes.  This patient presents for at risk foot care today.  General Appearance  Alert, conversant and in no acute stress.  Vascular  Dorsalis pedis and posterior tibial  pulses are palpable  bilaterally.  Capillary return is within normal limits  bilaterally. Temperature is within normal limits  bilaterally.  Neurologic  Senn-Weinstein monofilament wire test within normal limits  bilaterally. Muscle power within normal limits bilaterally.  Nails Thick disfigured discolored nails with subungual debris  from hallux to fifth toes bilaterally. No evidence of bacterial infection or drainage bilaterally.  Orthopedic  No limitations of motion  feet .  No crepitus or effusions noted.  No bony pathology or digital deformities noted. Plantarflexed fifth met left foot.   Skin  normotropic skin with no porokeratosis noted bilaterally.  No signs of infections or ulcers noted.   Asymptomatic porokeratosis.  Onychomycosis  Pain in right toes  Pain in left toes  Consent was obtained for treatment procedures.   Mechanical debridement of nails 1-5  bilaterally performed with a nail nipper.  Filed with dremel without incident.    Return office visit    3 months                 Told patient to return for periodic foot care and evaluation due to potential at risk complications.   Gardiner Barefoot DPM

## 2020-10-04 ENCOUNTER — Other Ambulatory Visit: Payer: Self-pay | Admitting: Urology

## 2020-10-12 ENCOUNTER — Ambulatory Visit: Payer: PPO | Admitting: Urology

## 2020-10-12 ENCOUNTER — Encounter: Payer: Self-pay | Admitting: Urology

## 2020-10-12 ENCOUNTER — Other Ambulatory Visit: Payer: Self-pay

## 2020-10-12 VITALS — BP 144/83 | HR 85 | Ht 72.0 in | Wt 247.0 lb

## 2020-10-12 DIAGNOSIS — R972 Elevated prostate specific antigen [PSA]: Secondary | ICD-10-CM | POA: Diagnosis not present

## 2020-10-12 DIAGNOSIS — Z7689 Persons encountering health services in other specified circumstances: Secondary | ICD-10-CM | POA: Diagnosis not present

## 2020-10-12 MED ORDER — LEVOFLOXACIN 500 MG PO TABS
500.0000 mg | ORAL_TABLET | Freq: Once | ORAL | Status: AC
  Administered 2020-10-12: 500 mg via ORAL

## 2020-10-12 MED ORDER — LIDOCAINE HCL 2 % IJ SOLN
10.0000 mL | Freq: Once | INTRAMUSCULAR | Status: AC
  Administered 2020-10-12: 200 mg

## 2020-10-12 MED ORDER — GENTAMICIN SULFATE 40 MG/ML IJ SOLN
80.0000 mg | Freq: Once | INTRAMUSCULAR | Status: AC
Start: 2020-10-12 — End: 2020-10-12
  Administered 2020-10-12: 80 mg via INTRAMUSCULAR

## 2020-10-12 NOTE — Progress Notes (Signed)
Prostate Biopsy Procedure   Informed consent was obtained after discussing risks/benefits of the procedure.  A time out was performed to ensure correct patient identity.  Pre-Procedure: - Last PSA Level:  Lab Results  Component Value Date   PSA 4.49 (H) 06/27/2020   PSA 3.2 06/22/2019   PSA 2.2 01/23/2018   - Prostate MRI with large PI-RADS 5 lesion left mid PZ - For cognitive biopsy - Gentamicin given prophylactically - Levaquin 500 mg administered PO -Transrectal Ultrasound performed revealing a 76.3 gm prostate - Left PZ hypoechoic mid-base  Procedure: - Prostate block performed using 10 cc 1% lidocaine and biopsies taken from sextant areas, a total of 12 under ultrasound guidance.  Post-Procedure: - Patient tolerated the procedure well - He was counseled to seek immediate medical attention if experiences any severe pain, significant bleeding, or fevers - Return in one week to discuss biopsy results   John Giovanni, MD

## 2020-10-13 LAB — SURGICAL PATHOLOGY

## 2020-10-17 ENCOUNTER — Other Ambulatory Visit: Payer: Self-pay | Admitting: Urology

## 2020-10-20 ENCOUNTER — Ambulatory Visit (INDEPENDENT_AMBULATORY_CARE_PROVIDER_SITE_OTHER): Payer: PPO | Admitting: Urology

## 2020-10-20 ENCOUNTER — Other Ambulatory Visit: Payer: Self-pay

## 2020-10-20 ENCOUNTER — Encounter: Payer: Self-pay | Admitting: Urology

## 2020-10-20 ENCOUNTER — Other Ambulatory Visit: Payer: Self-pay | Admitting: Urology

## 2020-10-20 VITALS — BP 149/87 | HR 79 | Ht 73.0 in | Wt 247.0 lb

## 2020-10-20 DIAGNOSIS — C61 Malignant neoplasm of prostate: Secondary | ICD-10-CM | POA: Diagnosis not present

## 2020-10-20 MED ORDER — TAMSULOSIN HCL 0.4 MG PO CAPS: 0.4000 mg | ORAL_CAPSULE | Freq: Every day | ORAL | 12 refills | Status: AC

## 2020-10-21 NOTE — Progress Notes (Signed)
10/20/2020 4:26 PM   Raymond [name] June 18, 1951 [id number]  Referring provider: [name], NP [place],  [place] [address]  Chief Complaint  Patient presents with  . Other    HPI: 70 y.o. male presents for prostate biopsy follow-up.   Initially referred 07/2020 for PSA 4.49  Prior PSA results 2.2 in 2019 and 3.2 in 2020  Repeat PSA was 5.8  Prostate MRI 09/2020 with 76 g gland and PI-RADS 5 lesion left mid gland PZ  Cognitive biopsy 10/12/2020; 76 g gland; left PZ hypoechoic; standard 12 core biopsies  No postbiopsy complaints  Pathology: 6/6 left-sided cores positive for high-volume disease as below with Gleason score 4+3 to 4+5 involving 95-100% submitted cores  Right apex with focal Gleason 3+3      PMH: Past Medical History:  Diagnosis Date  . Diabetes mellitus without complication ([place])   . Edema   . GERD (gastroesophageal reflux disease)   . Hyperlipidemia   . Hypertension   . Obesity, unspecified   . Problems with sexual function   . Sleep apnea    released per MD, no CPAP  . Unspecified sleep apnea   . Unspecified vitamin D deficiency     Surgical History: Past Surgical History:  Procedure Laterality Date  . CATARACT EXTRACTION  1964   Dr.Freed   . COLONOSCOPY    . HERNIA REPAIR  1987   Right Side, Dr.Kaufman   . POLYPECTOMY      Home Medications:  Allergies as of 10/20/2020   No Known Allergies     Medication List       Accurate as of October 20, 2020 11:59 PM. If you have any questions, ask your nurse or doctor.        aspirin 81 MG tablet Take 81 mg by mouth daily.   atorvastatin 40 MG tablet Commonly known as: LIPITOR TAKE 1 TABLET BY MOUTH EVERYDAY AT BEDTIME   enalapril 5 MG tablet Commonly known as: VASOTEC TAKE 1 TABLET BY MOUTH EVERY DAY   FreeStyle Lite Devi by Does not apply route.   glipiZIDE 5 MG tablet Commonly known as: GLUCOTROL Take 0.5 tablets (2.5 mg total) by mouth daily before  breakfast AND 1 tablet (5 mg total) daily before supper.   glucose blood test strip Freestyle Lite Test Strip: Use to check blood sugar 2-3 x weekly Dx: E11.9   metFORMIN 1000 MG tablet Commonly known as: GLUCOPHAGE Take 1 tablet (1,000 mg total) by mouth 2 (two) times daily with a meal.   sildenafil 100 MG tablet Commonly known as: VIAGRA 0.5-1 tab 1 hour prior to intercourse   tamsulosin 0.4 MG Caps capsule Commonly known as: FLOMAX Take 1 capsule (0.4 mg total) by mouth daily.   Vitamin D 50 MCG (2000 UT) tablet Take 2,000 Units by mouth daily.       Allergies: No Known Allergies  Family History: Family History  Problem Relation Age of Onset  . Colon cancer Mother 10  . Breast cancer Sister   . Breast cancer Father   . Ovarian cancer Sister   . Esophageal cancer Neg Hx   . Stomach cancer Neg Hx   . Rectal cancer Neg Hx   . Colon polyps Neg Hx     Social History:  reports that he has never smoked. He has never used smokeless tobacco. He reports that he does not drink alcohol and does not use drugs.   Physical Exam: BP (!) 149/87  Pulse 79   Ht 6\' 1"  (1.854 m)   Wt 247 lb (112 kg)   BMI 32.59 kg/m   Constitutional:  Alert and oriented, No acute distress. HEENT: [place] AT, moist mucus membranes.  Trachea midline, no masses. Cardiovascular: No clubbing, cyanosis, or edema. Respiratory: Normal respiratory effort, no increased work of breathing.   Assessment & Plan:    1. 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 Colgate and his wife  The risk stratification was reviewed  Prostate MRI showed no evidence of extracapsular disease or lymphadenopathy  Bone scan will be scheduled  He has a >5-year life expectancy had bone scan negative we discussed curative treatment options including radiation modalities with ADT  Radical prostatectomy was also discussed  Pros, cons and side effects/complications of treatments  were reviewed  He was provided literature on treatment options for prostate cancer  He will think over these options and will be notified with his bone scan results  I spent 35 total minutes on the day of the encounter including pre-visit review of the medical record, face-to-face time with the patient, and post visit ordering of labs/imaging/tests.   Abbie Sons, [place] [address], [place], [place] [address] [phone]

## 2020-10-22 ENCOUNTER — Encounter: Payer: Self-pay | Admitting: Urology

## 2020-11-02 ENCOUNTER — Encounter
Admission: RE | Admit: 2020-11-02 | Discharge: 2020-11-02 | Disposition: A | Discharge status: Home / Self Care | Payer: PPO | Source: Ambulatory Visit | Attending: Urology | Admitting: Urology

## 2020-11-02 ENCOUNTER — Other Ambulatory Visit: Payer: Self-pay

## 2020-11-02 DIAGNOSIS — C61 Malignant neoplasm of prostate: Secondary | ICD-10-CM | POA: Diagnosis not present

## 2020-11-02 MED ORDER — TECHNETIUM TC 99M MEDRONATE IV KIT
20.0000 | PACK | Freq: Once | INTRAVENOUS | Status: AC | PRN
  Administered 2020-11-02: 21.526 via INTRAVENOUS

## 2020-11-03 ENCOUNTER — Encounter: Payer: Self-pay | Admitting: Urology

## 2020-11-06 ENCOUNTER — Other Ambulatory Visit: Payer: Self-pay | Admitting: Urology

## 2020-11-06 DIAGNOSIS — C61 Malignant neoplasm of prostate: Secondary | ICD-10-CM

## 2020-11-26 ENCOUNTER — Ambulatory Visit
Admission: RE | Admit: 2020-11-26 | Discharge: 2020-11-26 | Disposition: A | Discharge status: Home / Self Care | Payer: PPO | Source: Ambulatory Visit | Attending: Urology | Admitting: Urology

## 2020-11-26 ENCOUNTER — Other Ambulatory Visit: Payer: Self-pay

## 2020-11-26 DIAGNOSIS — C61 Malignant neoplasm of prostate: Secondary | ICD-10-CM

## 2020-11-26 DIAGNOSIS — M4699 Unspecified inflammatory spondylopathy, multiple sites in spine: Secondary | ICD-10-CM | POA: Diagnosis not present

## 2020-11-26 DIAGNOSIS — M47817 Spondylosis without myelopathy or radiculopathy, lumbosacral region: Secondary | ICD-10-CM | POA: Diagnosis not present

## 2020-11-26 DIAGNOSIS — R948 Abnormal results of function studies of other organs and systems: Secondary | ICD-10-CM | POA: Diagnosis not present

## 2020-11-26 DIAGNOSIS — M5126 Other intervertebral disc displacement, lumbar region: Secondary | ICD-10-CM | POA: Diagnosis not present

## 2020-11-26 DIAGNOSIS — Z8546 Personal history of malignant neoplasm of prostate: Secondary | ICD-10-CM | POA: Diagnosis not present

## 2020-11-26 MED ORDER — GADOBUTROL 1 MMOL/ML IV SOLN
10.0000 mL | Freq: Once | INTRAVENOUS | Status: AC | PRN
  Administered 2020-11-26: 10 mL via INTRAVENOUS

## 2020-11-27 ENCOUNTER — Encounter: Payer: Self-pay | Admitting: Urology

## 2020-12-05 ENCOUNTER — Ambulatory Visit: Payer: PPO | Admitting: Urology

## 2020-12-16 ENCOUNTER — Other Ambulatory Visit: Payer: Self-pay | Admitting: Nurse Practitioner

## 2020-12-16 DIAGNOSIS — E785 Hyperlipidemia, unspecified: Secondary | ICD-10-CM

## 2020-12-21 ENCOUNTER — Other Ambulatory Visit: Payer: Self-pay

## 2020-12-21 ENCOUNTER — Ambulatory Visit (INDEPENDENT_AMBULATORY_CARE_PROVIDER_SITE_OTHER): Payer: PPO | Admitting: Nurse Practitioner

## 2020-12-21 ENCOUNTER — Encounter: Payer: Self-pay | Admitting: Nurse Practitioner

## 2020-12-21 DIAGNOSIS — E785 Hyperlipidemia, unspecified: Secondary | ICD-10-CM | POA: Diagnosis not present

## 2020-12-21 DIAGNOSIS — E559 Vitamin D deficiency, unspecified: Secondary | ICD-10-CM

## 2020-12-21 DIAGNOSIS — I10 Essential (primary) hypertension: Secondary | ICD-10-CM

## 2020-12-21 DIAGNOSIS — E1142 Type 2 diabetes mellitus with diabetic polyneuropathy: Secondary | ICD-10-CM

## 2020-12-21 DIAGNOSIS — C61 Malignant neoplasm of prostate: Secondary | ICD-10-CM | POA: Diagnosis not present

## 2020-12-21 NOTE — Progress Notes (Signed)
Careteam: Patient Care Team: Raymond Chandler, NP as PCP - General (Nurse Practitioner) Raymond Fam, MD as Consulting Physician (Ophthalmology)  PLACE OF SERVICE:  [place] Directive information Does Patient Have a Medical Advance Directive?: Yes, Type of Advance Directive: [place];Living will, Does patient want to make changes to medical advance directive?: No - Patient declined  No Known Allergies  Chief Complaint  Patient presents with  . Medical Management of Chronic Issues    6 month follow up.Discuss need for COVID booster (2nd one). Would like to discuss the issues with his prostate.     HPI: Patient is a 70 y.o. male for routine follow-up. He had elevated PSA and was referred to urologist- diagnosised with cancer He was referred to radiation oncologist. He wanted to discussed this with his urologist but he came down with COVID and now the next follow up is May 26th to discussed treatment options.   He retired February 26, 2020.  He has been exercising, has program. Remaining very active.   DM- fasting 120-130. Lower in the afternoon. One time blood sugar was 38. (he had not eaten lunch and was active, exercised in the morning)  Afternoon blood sugars (around 1pm) 85- before lunch.  Sometimes waits too long to eat  Review of Systems:  Review of Systems  Constitutional: Negative for chills, fever and weight loss.  HENT: Negative for tinnitus.   Respiratory: Negative for cough, sputum production and shortness of breath.   Cardiovascular: Negative for chest pain, palpitations and leg swelling.  Gastrointestinal: Negative for abdominal pain, constipation, diarrhea and heartburn.  Genitourinary: Negative for dysuria, frequency and urgency.  Musculoskeletal: Negative for back pain, falls, joint pain and myalgias.  Skin: Negative.   Neurological: Negative for dizziness and headaches.  Psychiatric/Behavioral: Negative for depression and  memory loss. The patient does not have insomnia.     Past Medical History:  Diagnosis Date  . Diabetes mellitus without complication ([place])   . Edema   . GERD (gastroesophageal reflux disease)   . Hyperlipidemia   . Hypertension   . Obesity, unspecified   . Problems with sexual function   . Sleep apnea    released per MD, no CPAP  . Unspecified sleep apnea   . Unspecified vitamin D deficiency    Past Surgical History:  Procedure Laterality Date  . CATARACT EXTRACTION  1964   Dr.Freed   . COLONOSCOPY    . HERNIA REPAIR  1987   Right Side, Dr.Kaufman   . POLYPECTOMY    . PROSTATE BIOPSY     Social History:   reports that he has never smoked. He has never used smokeless tobacco. He reports that he does not drink alcohol and does not use drugs.  Family History  Problem Relation Age of Onset  . Colon cancer Mother 40  . Breast cancer Sister   . Breast cancer Father   . Ovarian cancer Sister   . Esophageal cancer Neg Hx   . Stomach cancer Neg Hx   . Rectal cancer Neg Hx   . Colon polyps Neg Hx     Medications: Patient's Medications  New Prescriptions   No medications on file  Previous Medications   ASPIRIN 81 MG TABLET    Take 81 mg by mouth daily.   ATORVASTATIN (LIPITOR) 40 MG TABLET    TAKE 1 TABLET BY MOUTH EVERYDAY AT BEDTIME   BLOOD GLUCOSE MONITORING SUPPL (FREESTYLE LITE) DEVI  by Does not apply route.   CHOLECALCIFEROL (VITAMIN D) 50 MCG (2000 UT) TABLET    Take 2,000 Units by mouth daily.   ENALAPRIL (VASOTEC) 5 MG TABLET    TAKE 1 TABLET BY MOUTH EVERY DAY   GLIPIZIDE (GLUCOTROL) 5 MG TABLET    Take 0.5 tablets (2.5 mg total) by mouth daily before breakfast AND 1 tablet (5 mg total) daily before supper.   GLUCOSE BLOOD TEST STRIP    Freestyle Lite Test Strip: Use to check blood sugar 2-3 x weekly Dx: E11.9   METFORMIN (GLUCOPHAGE) 1000 MG TABLET    Take 1 tablet (1,000 mg total) by mouth 2 (two) times daily with a meal.   SILDENAFIL (VIAGRA) 100 MG TABLET     0.5-1 tab 1 hour prior to intercourse   TAMSULOSIN (FLOMAX) 0.4 MG CAPS CAPSULE    Take 1 capsule (0.4 mg total) by mouth daily.  Modified Medications   No medications on file  Discontinued Medications   No medications on file    Physical Exam:  Vitals:   12/21/20 0833  BP: 140/90  Pulse: 75  Temp: (!) 97.3 F (36.3 C)  TempSrc: Temporal  SpO2: 97%  Weight: 261 lb 6.4 oz (118.6 kg)  Height: 6' 1"  (1.854 m)   Body mass index is 34.49 kg/m. Wt Readings from Last 3 Encounters:  12/21/20 261 lb 6.4 oz (118.6 kg)  10/20/20 247 lb (112 kg)  10/12/20 247 lb (112 kg)    Physical Exam Constitutional:      General: He is not in acute distress.    Appearance: He is well-developed. He is not diaphoretic.  HENT:     Head: Normocephalic and atraumatic.     Mouth/Throat:     Pharynx: No oropharyngeal exudate.  Eyes:     Conjunctiva/sclera: Conjunctivae normal.     Pupils: Pupils are equal, round, and reactive to light.  Cardiovascular:     Rate and Rhythm: Normal rate and regular rhythm.     Heart sounds: Normal heart sounds.  Pulmonary:     Effort: Pulmonary effort is normal.     Breath sounds: Normal breath sounds.  Abdominal:     General: Bowel sounds are normal.     Palpations: Abdomen is soft.  Musculoskeletal:        General: No tenderness.     Cervical back: Normal range of motion and neck supple.  Skin:    General: Skin is warm and dry.  Neurological:     Mental Status: He is alert and oriented to person, place, and time.    Labs reviewed: Basic Metabolic Panel: Recent Labs    12/25/19 1021 06/27/20 1016  NA 136 136  K 4.3 4.5  CL 104 105  CO2 23 22  GLUCOSE 101* 112*  BUN 17 16  CREATININE 0.89 1.01  CALCIUM 9.6 9.3   Liver Function Tests: Recent Labs    12/25/19 1021 06/27/20 1016  AST 19 24  ALT 23 35  BILITOT 0.7 0.8  PROT 6.8 6.9   No results for input(s): LIPASE, AMYLASE in the last 8760 hours. No results for input(s): AMMONIA in  the last 8760 hours. CBC: Recent Labs    12/25/19 1021  WBC 4.2  NEUTROABS 2,201  HGB 14.3  HCT 42.9  MCV 82.2  PLT 246   Lipid Panel: Recent Labs    12/25/19 1021  CHOL 124  HDL 34*  LDLCALC 70  TRIG 117  CHOLHDL 3.6  TSH: No results for input(s): TSH in the last 8760 hours. A1C: Lab Results  Component Value Date   HGBA1C 6.6 (A) 09/12/2020     Assessment/Plan 1. Morbid obesity ([place]) -education provided on healthy weight loss through increase in physical activity and proper nutrition   2. Type 2 diabetes mellitus with diabetic polyneuropathy, without long-term current use of insulin ([facility]) -Encouraged dietary compliance, routine foot care/monitoring and to keep up with diabetic eye exams through ophthalmology  -encouraged eating 3 meals a day, not skipping a meal -may need to stop glipizide at breakfast and only take daily at supper - Hemoglobin A1c  3. Prostate cancer [facility]) -new dx, waiting in person visit with urologist to discuss treatment options.  4. Hyperlipidemia, unspecified hyperlipidemia type -continues on statin. Encouraged dietary modifications. - Lipid Panel - CMP with eGFR([facility])  5. Essential hypertension -stable. Continue lifestyle modifications with diet and exercise. Continues on enalapril 5 mg daily  - CMP with eGFR([facility]) - CBC with Differential/Platelet  6. Vitamin D deficiency -taking vit d 2000 units daily  - Vitamin D, 25-hydroxy  Next appt: 6 months.  Raymond [name]. [place], [facility] [phone]

## 2020-12-21 NOTE — Patient Instructions (Signed)
To work on making sure you are eating 3 meals a day- not skipping meals Good protein intake, make sure you focus on decreasing carbohydrates and look at more complex carbohydrates.

## 2020-12-22 LAB — CBC WITH DIFFERENTIAL/PLATELET
Absolute Monocytes: 467 cells/uL (ref 200–950)
Basophils Absolute: 19 cells/uL (ref 0–200)
Basophils Relative: 0.5 %
Eosinophils Absolute: 171 cells/uL (ref 15–500)
Eosinophils Relative: 4.5 %
HCT: 40.1 % (ref 38.5–50.0)
Hemoglobin: 13 g/dL — ABNORMAL LOW (ref 13.2–17.1)
Lymphs Abs: 1376 cells/uL (ref 850–3900)
MCH: 26.7 pg — ABNORMAL LOW (ref 27.0–33.0)
MCHC: 32.4 g/dL (ref 32.0–36.0)
MCV: 82.3 fL (ref 80.0–100.0)
MPV: 10.6 fL (ref 7.5–12.5)
Monocytes Relative: 12.3 %
Neutro Abs: 1767 cells/uL (ref 1500–7800)
Neutrophils Relative %: 46.5 %
Platelets: 214 10*3/uL (ref 140–400)
RBC: 4.87 10*6/uL (ref 4.20–5.80)
RDW: 13.7 % (ref 11.0–15.0)
Total Lymphocyte: 36.2 %
WBC: 3.8 10*3/uL (ref 3.8–10.8)

## 2020-12-22 LAB — HEMOGLOBIN A1C
Hgb A1c MFr Bld: 6.9 % of total Hgb — ABNORMAL HIGH (ref <5.7)
Mean Plasma Glucose: 151 mg/dL
eAG (mmol/L): 8.4 mmol/L

## 2020-12-22 LAB — LIPID PANEL
Cholesterol: 109 mg/dL (ref <200)
HDL: 30 mg/dL — ABNORMAL LOW (ref >=40)
LDL Cholesterol (Calc): 55 mg/dL (calc)
Non-HDL Cholesterol (Calc): 79 mg/dL (calc) (ref <130)
Total CHOL/HDL Ratio: 3.6 (calc) (ref <5.0)
Triglycerides: 159 mg/dL — ABNORMAL HIGH (ref <150)

## 2020-12-22 LAB — COMPLETE METABOLIC PANEL WITH GFR
AG Ratio: 1.9 (calc) (ref 1.0–2.5)
ALT: 27 U/L (ref 9–46)
AST: 19 U/L (ref 10–35)
Albumin: 4.2 g/dL (ref 3.6–5.1)
Alkaline phosphatase (APISO): 53 U/L (ref 35–144)
BUN: 16 mg/dL (ref 7–25)
CO2: 22 mmol/L (ref 20–32)
Calcium: 9.1 mg/dL (ref 8.6–10.3)
Chloride: 105 mmol/L (ref 98–110)
Creat: 0.9 mg/dL (ref 0.70–1.25)
GFR, Est African American: 101 mL/min/{1.73_m2} (ref >=60)
GFR, Est Non African American: 87 mL/min/{1.73_m2} (ref >=60)
Globulin: 2.2 g/dL (calc) (ref 1.9–3.7)
Glucose, Bld: 99 mg/dL (ref 65–99)
Potassium: 4.4 mmol/L (ref 3.5–5.3)
Sodium: 137 mmol/L (ref 135–146)
Total Bilirubin: 0.6 mg/dL (ref 0.2–1.2)
Total Protein: 6.4 g/dL (ref 6.1–8.1)

## 2020-12-22 LAB — VITAMIN D 25 HYDROXY (VIT D DEFICIENCY, FRACTURES): Vit D, 25-Hydroxy: 35 ng/mL (ref 30–100)

## 2020-12-26 DIAGNOSIS — B029 Zoster without complications: Secondary | ICD-10-CM | POA: Diagnosis not present

## 2020-12-29 ENCOUNTER — Ambulatory Visit (INDEPENDENT_AMBULATORY_CARE_PROVIDER_SITE_OTHER): Payer: PPO | Admitting: Urology

## 2020-12-29 ENCOUNTER — Encounter: Payer: Self-pay | Admitting: Urology

## 2020-12-29 ENCOUNTER — Other Ambulatory Visit: Payer: Self-pay

## 2020-12-29 VITALS — BP 136/85 | HR 80 | Ht 73.0 in | Wt 244.0 lb

## 2020-12-29 DIAGNOSIS — C61 Malignant neoplasm of prostate: Secondary | ICD-10-CM | POA: Diagnosis not present

## 2020-12-29 NOTE — Progress Notes (Signed)
12/29/2020 7:15 PM   [name] [birth year] [id number]  Referring provider: [name], NP [place],  [place] [address]  Chief Complaint  Patient presents with  . Other    HPI: 70 y.o. male presents for follow-up prostate cancer.   T1c high risk prostate cancer  Biopsy 10/12/2020; 76 g gland  6/12 cores positive Gleason 4+3 to Gleason 4+5  1 core Gleason 3+3     MRI without evidence of extracapsular disease  Bone scan with increased activity lower lumbar spine though felt metastatic disease could not be excluded  Lumbar MRI showed these findings consistent with degenerative change   PMH: Past Medical History:  Diagnosis Date  . Diabetes mellitus without complication ([place])   . Edema   . GERD (gastroesophageal reflux disease)   . Hyperlipidemia   . Hypertension   . Obesity, unspecified   . Problems with sexual function   . Sleep apnea    released per MD, no CPAP  . Unspecified sleep apnea   . Unspecified vitamin D deficiency     Surgical History: Past Surgical History:  Procedure Laterality Date  . CATARACT EXTRACTION  1964   Dr.Freed   . COLONOSCOPY    . HERNIA REPAIR  1987   Right Side, Dr.Kaufman   . POLYPECTOMY    . PROSTATE BIOPSY      Home Medications:  Allergies as of 12/29/2020   No Known Allergies     Medication List       Accurate as of Dec 29, 2020 11:59 PM. If you have any questions, ask your nurse or doctor.        aspirin 81 MG tablet Take 81 mg by mouth daily.   atorvastatin 40 MG tablet Commonly known as: LIPITOR TAKE 1 TABLET BY MOUTH EVERYDAY AT BEDTIME   enalapril 5 MG tablet Commonly known as: VASOTEC TAKE 1 TABLET BY MOUTH EVERY DAY   FreeStyle Lite Devi by Does not apply route.   glipiZIDE 5 MG tablet Commonly known as: GLUCOTROL Take 0.5 tablets (2.5 mg total) by mouth daily before breakfast AND 1 tablet (5 mg total) daily before supper.   glucose blood test strip Freestyle Lite  Test Strip: Use to check blood sugar 2-3 x weekly Dx: E11.9   metFORMIN 1000 MG tablet Commonly known as: GLUCOPHAGE Take 1 tablet (1,000 mg total) by mouth 2 (two) times daily with a meal.   sildenafil 100 MG tablet Commonly known as: VIAGRA 0.5-1 tab 1 hour prior to intercourse   tamsulosin 0.4 MG Caps capsule Commonly known as: FLOMAX Take 1 capsule (0.4 mg total) by mouth daily.   Vitamin D 50 MCG (2000 UT) tablet Take 2,000 Units by mouth daily.       Allergies: No Known Allergies  Family History: Family History  Problem Relation Age of Onset  . Colon cancer Mother 2  . Breast cancer Sister   . Breast cancer Father   . Ovarian cancer Sister   . Esophageal cancer Neg Hx   . Stomach cancer Neg Hx   . Rectal cancer Neg Hx   . Colon polyps Neg Hx     Social History:  reports that he has never smoked. He has never used smokeless tobacco. He reports that he does not drink alcohol and does not use drugs.   Physical Exam: BP 136/85   Pulse 80   Ht 6\' 1"  (1.854 m)   Wt 244 lb (110.7 kg)  BMI 32.19 kg/m   Constitutional:  Alert and oriented, No acute distress.   Assessment & Plan:    1.  T1c high risk prostate cancer  I again reviewed management options.  Based on his pathology report he does have a higher risk of microscopic extracapsular disease and radical prostatectomy may not be curable.  We discussed RALP and I informed him I do not perform this procedure however my partners and many providers in the area do perform  Radiation modalities were also discussed, primarily IMRT + ADT  He lives closer to [facility] and did request Referral to [facility] to discuss radical prostatectomy and with the [facility] in [place] to discuss radiation  I spent 30 total minutes on the day of the encounter including pre-visit review of the medical record, face-to-face time with the patient, and post visit ordering of labs/imaging/tests.   Abbie Sons,  [place] [address] [place], [place] [address] [phone]

## 2020-12-30 ENCOUNTER — Encounter: Payer: Self-pay | Admitting: Urology

## 2021-01-04 ENCOUNTER — Other Ambulatory Visit: Payer: Self-pay | Admitting: Nurse Practitioner

## 2021-01-05 ENCOUNTER — Encounter: Payer: Self-pay | Admitting: Radiation Oncology

## 2021-01-05 ENCOUNTER — Ambulatory Visit
Admission: RE | Admit: 2021-01-05 | Discharge: 2021-01-05 | Disposition: A | Discharge status: Home / Self Care | Payer: PPO | Source: Ambulatory Visit | Attending: Radiation Oncology | Admitting: Radiation Oncology

## 2021-01-05 ENCOUNTER — Other Ambulatory Visit: Payer: Self-pay

## 2021-01-05 ENCOUNTER — Encounter: Payer: Self-pay | Admitting: Podiatry

## 2021-01-05 ENCOUNTER — Ambulatory Visit: Payer: PPO | Admitting: Podiatry

## 2021-01-05 VITALS — BP 142/78 | HR 80 | Temp 96.7°F | Resp 18 | Ht 73.0 in | Wt 261.0 lb

## 2021-01-05 DIAGNOSIS — Z8041 Family history of malignant neoplasm of ovary: Secondary | ICD-10-CM | POA: Insufficient documentation

## 2021-01-05 DIAGNOSIS — E559 Vitamin D deficiency, unspecified: Secondary | ICD-10-CM | POA: Insufficient documentation

## 2021-01-05 DIAGNOSIS — K219 Gastro-esophageal reflux disease without esophagitis: Secondary | ICD-10-CM | POA: Diagnosis not present

## 2021-01-05 DIAGNOSIS — Z800 Family history of malignant neoplasm of digestive organs: Secondary | ICD-10-CM | POA: Diagnosis not present

## 2021-01-05 DIAGNOSIS — Z803 Family history of malignant neoplasm of breast: Secondary | ICD-10-CM | POA: Insufficient documentation

## 2021-01-05 DIAGNOSIS — E119 Type 2 diabetes mellitus without complications: Secondary | ICD-10-CM | POA: Diagnosis not present

## 2021-01-05 DIAGNOSIS — Z7984 Long term (current) use of oral hypoglycemic drugs: Secondary | ICD-10-CM | POA: Insufficient documentation

## 2021-01-05 DIAGNOSIS — E669 Obesity, unspecified: Secondary | ICD-10-CM | POA: Insufficient documentation

## 2021-01-05 DIAGNOSIS — E785 Hyperlipidemia, unspecified: Secondary | ICD-10-CM | POA: Diagnosis not present

## 2021-01-05 DIAGNOSIS — M79674 Pain in right toe(s): Secondary | ICD-10-CM

## 2021-01-05 DIAGNOSIS — G4730 Sleep apnea, unspecified: Secondary | ICD-10-CM | POA: Insufficient documentation

## 2021-01-05 DIAGNOSIS — I10 Essential (primary) hypertension: Secondary | ICD-10-CM | POA: Insufficient documentation

## 2021-01-05 DIAGNOSIS — Z79899 Other long term (current) drug therapy: Secondary | ICD-10-CM | POA: Diagnosis not present

## 2021-01-05 DIAGNOSIS — C61 Malignant neoplasm of prostate: Secondary | ICD-10-CM | POA: Insufficient documentation

## 2021-01-05 DIAGNOSIS — M79675 Pain in left toe(s): Secondary | ICD-10-CM

## 2021-01-05 DIAGNOSIS — Z7982 Long term (current) use of aspirin: Secondary | ICD-10-CM | POA: Diagnosis not present

## 2021-01-05 DIAGNOSIS — B351 Tinea unguium: Secondary | ICD-10-CM

## 2021-01-05 DIAGNOSIS — E1165 Type 2 diabetes mellitus with hyperglycemia: Secondary | ICD-10-CM

## 2021-01-05 HISTORY — DX: Malignant neoplasm of prostate: C61

## 2021-01-05 NOTE — Progress Notes (Signed)
GU Location of Tumor / Histology: prostatic adenocarcinoma  If Prostate Cancer, Gleason Score is (4 + 5) and PSA is (). Prostate volume: 76 g  [name] presented:  Initially referred 07/2020 for PSA 4.49  Prior PSA results 2.2 in 2019 and 3.2 in 2020  Repeat PSA was 5.8  Prostate MRI 09/2020 with 76 g gland and PI-RADS 5 lesion left mid gland PZ  Cognitive biopsy 10/12/2020; 76 g gland; left PZ hypoechoic   Biopsies of prostate (if applicable) revealed:    Past/Anticipated interventions by urology, if any: prostate biopsy, MRI (without extracapsular disease), Bone scan (lumber spine), MRI lumbar spine (degenerative disease), referral to [facility] to discuss prostatectomy, referral to Dr. Tammi Klippel to discuss radiation options.  Past/Anticipated interventions by medical oncology, if any: no  Weight changes, if any: denies  Bowel/Bladder complaints, if any: Denies urinary leakage or incontinence. Denies dysuria or hematuria. On Flomax. Denies any bowel complaints.    Nausea/Vomiting, if any: denies  Pain issues, if any:  denies  SAFETY ISSUES:  Prior radiation? denies  Pacemaker/ICD? denies  Possible current pregnancy? no, male patient  Is the patient on methotrexate? denies  Current Complaints / other details:  71 year old male. Resides in [place].

## 2021-01-05 NOTE — Progress Notes (Signed)
This patient returns to my office for at risk foot care.  This patient requires this care by a professional since this patient will be at risk due to having diabetes.  This patient is unable to cut nails himself since the patient cannot reach his nails.These nails are painful walking and wearing shoes.  This patient presents for at risk foot care today.  General Appearance  Alert, conversant and in no acute stress.  Vascular  Dorsalis pedis and posterior tibial  pulses are palpable  bilaterally.  Capillary return is within normal limits  bilaterally. Temperature is within normal limits  bilaterally.  Neurologic  Senn-Weinstein monofilament wire test within normal limits  bilaterally. Muscle power within normal limits bilaterally.  Nails Thick disfigured discolored nails with subungual debris  from hallux to fifth toes bilaterally. No evidence of bacterial infection or drainage bilaterally.  Orthopedic  No limitations of motion  feet .  No crepitus or effusions noted.  No bony pathology or digital deformities noted. Plantarflexed fifth met left foot.   Skin  normotropic skin with no porokeratosis noted bilaterally.  No signs of infections or ulcers noted.   Asymptomatic porokeratosis.  Onychomycosis  Pain in right toes  Pain in left toes  Consent was obtained for treatment procedures.   Mechanical debridement of nails 1-5  bilaterally performed with a nail nipper.  Filed with dremel without incident.    Return office visit    3 months                 Told patient to return for periodic foot care and evaluation due to potential at risk complications.   Gardiner Barefoot DPM

## 2021-01-05 NOTE — Progress Notes (Signed)
Radiation Oncology         [phone]) [phone] ________________________________  Initial outpatient Consultation  Name: Raymond [name] MRN: [id number]  Date: 01/05/2021  DOB: [birth year]  Raymond [name], Raymond American, NP  Bernardo Heater, Ronda Fairly, MD   REFERRING PHYSICIAN: Abbie Sons, MD  DIAGNOSIS: 70 y.o. gentleman with Stage T1c adenocarcinoma of the prostate with Gleason score of 4+5, and PSA of 5.8.    ICD-10-CM   1. Malignant neoplasm of prostate ([place])  C61     HISTORY OF PRESENT ILLNESS: Raymond [name] is a 70 y.o. male with a diagnosis of prostate cancer. He was noted to have progressive LUTS and an elevated PSA of 4.49 by his primary care provider, Sherrie Mustache, NP.  Accordingly, he was referred for evaluation in urology by Dr. Bernardo Heater on 07/18/20,  digital rectal examination was performed at that time revealing no nodules or concerning findings. He was given a trial of Flomax and a repeat PSA one month later on 08/18/20 was further elevated at 5.8.  This prompted further evaluation with a prostate MRI which was performed on 09/13/20 showing a 2 cm PI-RADS 5 lesion in the left mid gland peripheral zone extending into the left base peripheral zone but without evidence of LAN or ECE. The patient proceeded to transrectal ultrasound with 12 biopsies of the prostate on 10/12/20.  The prostate volume measured 76 cc.  Out of 12 core biopsies, 7 were positive.  The maximum Gleason score was 4+5, and this was seen in the left mid lateral.  Additionally, Gleason 4+4 was seen in the left base, Gleason 4+3 in the left apex, left apex lateral, left mid and left apex lateral as well as Gleason 3+3 in the right apex.  He had a bone scan on 11/02/2020 which showed increased activity in the lumbar spine felt to be indeterminant.  This was evaluated further with an MRI of the lumbar spine performed on 11/26/2020 showing degenerative changes only, no evidence of osseous metastases.  The patient reviewed the biopsy results  with his urologist and he has kindly been referred today for discussion of potential radiation treatment options.  He has also been referred to [facility] in [place] to discuss surgical options and transfer his urologic care for convenience since he lives a little closer to [place].   PREVIOUS RADIATION THERAPY: No  PAST MEDICAL HISTORY:  Past Medical History:  Diagnosis Date  . Diabetes mellitus without complication ([place])   . Edema   . GERD (gastroesophageal reflux disease)   . Hyperlipidemia   . Hypertension   . Obesity, unspecified   . Problems with sexual function   . Sleep apnea    released per MD, no CPAP  . Unspecified sleep apnea   . Unspecified vitamin D deficiency       PAST SURGICAL HISTORY: Past Surgical History:  Procedure Laterality Date  . CATARACT EXTRACTION  1964   Dr.Freed   . COLONOSCOPY    . HERNIA REPAIR  1987   Right Side, Dr.Kaufman   . POLYPECTOMY    . PROSTATE BIOPSY      FAMILY HISTORY:  Family History  Problem Relation Age of Onset  . Colon cancer Mother 39  . Breast cancer Sister   . Breast cancer Father   . Ovarian cancer Sister   . Esophageal cancer Neg Hx   . Stomach cancer Neg Hx   . Rectal cancer Neg Hx   . Colon polyps Neg Hx  SOCIAL HISTORY:  Social History   Socioeconomic History  . Marital status: Married    Spouse name: Not on file  . Number of children: Not on file  . Years of education: Not on file  . Highest education level: Not on file  Occupational History  . Not on file  Tobacco Use  . Smoking status: Never Smoker  . Smokeless tobacco: Never Used  Vaping Use  . Vaping Use: Never used  Substance and Sexual Activity  . Alcohol use: No  . Drug use: No  . Sexual activity: Yes  Other Topics Concern  . Not on file  Social History Narrative  . Not on file   Social Determinants of Health   Financial Resource Strain: Not on file  Food Insecurity: Not on file  Transportation Needs: Not on file   Physical Activity: Not on file  Stress: Not on file  Social Connections: Not on file  Intimate Partner Violence: Not on file    ALLERGIES: Patient has no known allergies.  MEDICATIONS:  Current Outpatient Medications  Medication Sig Dispense Refill  . aspirin 81 MG tablet Take 81 mg by mouth daily.    Marland Kitchen atorvastatin (LIPITOR) 40 MG tablet TAKE 1 TABLET BY MOUTH EVERYDAY AT BEDTIME 90 tablet 1  . Blood Glucose Monitoring Suppl (FREESTYLE LITE) DEVI by Does not apply route.    . Cholecalciferol (VITAMIN D) 50 MCG (2000 UT) tablet Take 2,000 Units by mouth daily.    . enalapril (VASOTEC) 5 MG tablet TAKE 1 TABLET BY MOUTH EVERY DAY 90 tablet 1  . glipiZIDE (GLUCOTROL) 5 MG tablet Take 0.5 tablets (2.5 mg total) by mouth daily before breakfast AND 1 tablet (5 mg total) daily before supper. 135 tablet 3  . glucose blood test strip Freestyle Lite Test Strip: Use to check blood sugar 2-3 x weekly Dx: E11.9 300 each 3  . metFORMIN (GLUCOPHAGE) 1000 MG tablet Take 1 tablet (1,000 mg total) by mouth 2 (two) times daily with a meal. 180 tablet 3  . sildenafil (VIAGRA) 100 MG tablet 0.5-1 tab 1 hour prior to intercourse 10 tablet 0  . tamsulosin (FLOMAX) 0.4 MG CAPS capsule Take 1 capsule (0.4 mg total) by mouth daily. 30 capsule 12   No current facility-administered medications for this encounter.    REVIEW OF SYSTEMS:  On review of systems, the patient reports that he is doing well overall. He denies any chest pain, shortness of breath, cough, fevers, chills, night sweats, unintended weight changes. He denies any bowel disturbances, and denies abdominal pain, nausea or vomiting. He denies any new musculoskeletal or joint aches or pains. His IPSS was 14, indicating moderate urinary symptoms with weak flow, frequency, urgency, intermittency and nocturia x2 despite Flomax daily. His SHIM was 12, indicating he has moderate erectile dysfunction. A complete review of systems is obtained and is otherwise  negative.    PHYSICAL EXAM:  Wt Readings from Last 3 Encounters:  12/29/20 244 lb (110.7 kg)  12/21/20 261 lb 6.4 oz (118.6 kg)  10/20/20 247 lb (112 kg)   Temp Readings from Last 3 Encounters:  12/21/20 (!) 97.3 F (36.3 C) (Temporal)  06/27/20 (!) 97.1 F (36.2 C) (Temporal)  12/25/19 (!) 96.8 F (36 C) (Temporal)   BP Readings from Last 3 Encounters:  12/29/20 136/85  12/21/20 140/90  10/20/20 (!) 149/87   Pulse Readings from Last 3 Encounters:  12/29/20 80  12/21/20 75  10/20/20 79    /10  In general  this is a well appearing Caucasian male in no acute distress. He's alert and oriented x4 and appropriate throughout the examination. Cardiopulmonary assessment is negative for acute distress, and he exhibits normal effort.     KPS = 100  100 - Normal; no complaints; no evidence of disease. 90   - Able to carry on normal activity; minor signs or symptoms of disease. 80   - Normal activity with effort; some signs or symptoms of disease. 25   - Cares for self; unable to carry on normal activity or to do active work. 60   - Requires occasional assistance, but is able to care for most of his personal needs. 50   - Requires considerable assistance and frequent medical care. 44   - Disabled; requires special care and assistance. 49   - Severely disabled; hospital admission is indicated although death not imminent. 63   - Very sick; hospital admission necessary; active supportive treatment necessary. 10   - Moribund; fatal processes progressing rapidly. 0     - Dead  Karnofsky DA, Abelmann [place], Craver LS and Burchenal [facility] [phone]) The use of the nitrogen mustards in the palliative treatment of carcinoma: with particular reference to bronchogenic carcinoma Cancer 1 634-56  LABORATORY DATA:  Lab Results  Component Value Date   WBC 3.8 12/21/2020   HGB 13.0 (L) 12/21/2020   HCT 40.1 12/21/2020   MCV 82.3 12/21/2020   PLT 214 12/21/2020   Lab Results  Component Value Date    NA 137 12/21/2020   K 4.4 12/21/2020   CL 105 12/21/2020   CO2 22 12/21/2020   Lab Results  Component Value Date   ALT 27 12/21/2020   AST 19 12/21/2020   ALKPHOS 57 01/16/2017   BILITOT 0.6 12/21/2020     RADIOGRAPHY: No results found.    IMPRESSION/PLAN: 1. 70 y.o. gentleman with Stage T1c adenocarcinoma of the prostate with Gleason Score of 4+5, and PSA of 5.8. We discussed the patient's workup and outlined the nature of prostate cancer in this setting. The patient's T stage, Gleason's score, and PSA put him into the high risk group. Accordingly, he is eligible for a variety of potential treatment options including prostatectomy or LT-ADT concurrent with either 8 weeks of external radiation or 5 weeks of external radiation with an upfront brachytherapy boost. We discussed the available radiation techniques, and focused on the details and logistics of delivery. The patient is not an ideal candidate for brachytherapy boost with a prostate volume of 76 cc.  We discussed and outlined the risks, benefits, short and long-term effects associated with radiotherapy and compared and contrasted these with prostatectomy. We discussed the role of SpaceOAR gel in reducing the rectal toxicity associated with radiotherapy. We also detailed the role of ADT in the treatment of high risk prostate cancer and outlined the associated side effects that could be expected with this therapy.  We discussed that the role of prostatectomy in the setting of high risk disease is often multi-modality, in that there is a high probability that he will need adjuvant/salvage radiation following surgery. He appears to have a good understanding of his disease and our treatment recommendations which are of curative intent.  He was encouraged to ask questions that were answered to his stated satisfaction.  At the conclusion of our conversation, the patient is interested in moving forward with 8 weeks of external beam therapy  concurrent with LT-ADT. He has not received any ADT to date. We recommend he  get an updated PSA prior to beginning treatment so that we have an accurate baseline since his most recent PSA on file is from 08/2020. We will share our discussion with Dr. Bernardo Heater and make arrangements for a follow up visit, first available, to start ADT now.  The patient prefers to transfer his care to [facility] in [place] for convenience so we will make a referral to Dr. Burman Nieves for second opinion, in hopes that he will assume his care going forward. We will make arrangements for fiducial markers and SpaceOAR gel placement in August 2022, prior to simulation, to reduce rectal toxicity from radiotherapy. The patient appears to have a good understanding of his disease and our treatment recommendations which are of curative intent and is in agreement with the stated plan.  Therefore, we will move forward with treatment planning accordingly, in anticipation of beginning IMRT approximately 2 months after starting ADT.   We personally spent 90 minutes in this encounter including chart review, reviewing radiological studies, meeting face-to-face with the patient, entering orders, coordinating his care and completing documentation.    Nicholos Johns, PA-C    Tyler Pita, MD  [facility] Direct Dial: [phone]  Fax: [phone] .com  Skype  LinkedIn

## 2021-01-09 ENCOUNTER — Telehealth: Payer: Self-pay | Admitting: *Deleted

## 2021-01-09 NOTE — Telephone Encounter (Signed)
Called patient to inform of appt. with Dr. Bernardo Heater on 01-20-21 - arrival time- 11 am for updated PSA and ADT, spoke with patient and he is aware of this appt.

## 2021-01-11 ENCOUNTER — Telehealth: Payer: Self-pay

## 2021-01-11 NOTE — Telephone Encounter (Signed)
No PA Required for Eligard.

## 2021-01-14 DIAGNOSIS — M25561 Pain in right knee: Secondary | ICD-10-CM | POA: Diagnosis not present

## 2021-01-20 ENCOUNTER — Other Ambulatory Visit: Payer: Self-pay

## 2021-01-20 ENCOUNTER — Ambulatory Visit (INDEPENDENT_AMBULATORY_CARE_PROVIDER_SITE_OTHER): Payer: PPO | Admitting: *Deleted

## 2021-01-20 DIAGNOSIS — C61 Malignant neoplasm of prostate: Secondary | ICD-10-CM

## 2021-01-20 MED ORDER — LEUPROLIDE ACETATE (6 MONTH) 45 MG ~~LOC~~ KIT
45.0000 mg | PACK | Freq: Once | SUBCUTANEOUS | Status: AC
  Administered 2021-01-20: 45 mg via SUBCUTANEOUS

## 2021-01-20 NOTE — Progress Notes (Signed)
Eligard SubQ Injection   Due to Prostate Cancer patient is present today for a Eligard Injection.  Medication: Eligard 6 month Dose: 45 mg  Location: right lower abdomen Lot:  [id number] Exp: 08/2022  Patient tolerated well, no complications were noted  Performed by: Verlene Mayer, CMA  Per Dr. Bernardo Heater  patient is to continue therapy for 6 months . Patient transferring care to [facility]-he is aware his next injection should be in 6 months. Reminder continue on Vitamin D 800-1000iu and Calium 1000-1200mg  daily while on Androgen Deprivation Therapy.

## 2021-01-21 LAB — PSA: Prostate Specific Ag, Serum: 6.5 ng/mL — ABNORMAL HIGH (ref 0.0–4.0)

## 2021-01-31 DIAGNOSIS — C61 Malignant neoplasm of prostate: Secondary | ICD-10-CM | POA: Diagnosis not present

## 2021-03-08 DIAGNOSIS — C61 Malignant neoplasm of prostate: Secondary | ICD-10-CM | POA: Diagnosis not present

## 2021-03-09 ENCOUNTER — Telehealth: Payer: Self-pay | Admitting: *Deleted

## 2021-03-09 NOTE — Telephone Encounter (Signed)
CALLED PATIENT TO REMIND OF SIM APPT. FOR 03-10-21- ARRIVAL TIME- 2:15 PM @ [facility], SPOKE WITH PATIENT AND HE IS AWARE OF THIS APPT.

## 2021-03-10 ENCOUNTER — Ambulatory Visit
Admission: RE | Admit: 2021-03-10 | Discharge: 2021-03-10 | Disposition: A | Discharge status: Home / Self Care | Payer: PPO | Source: Ambulatory Visit | Attending: Radiation Oncology | Admitting: Radiation Oncology

## 2021-03-10 ENCOUNTER — Other Ambulatory Visit: Payer: Self-pay

## 2021-03-10 DIAGNOSIS — C61 Malignant neoplasm of prostate: Secondary | ICD-10-CM | POA: Insufficient documentation

## 2021-03-10 DIAGNOSIS — Z510 Encounter for antineoplastic radiation therapy: Secondary | ICD-10-CM | POA: Diagnosis not present

## 2021-03-10 NOTE — Progress Notes (Signed)
  Radiation Oncology         [phone]) [phone] ________________________________  Name: Raymond [name] MRN: [id number]  Date: 03/10/2021  DOB: 12-06-1950  SIMULATION AND TREATMENT PLANNING NOTE    ICD-10-CM   1. Malignant neoplasm of prostate ([place])  C61       DIAGNOSIS: 70 y.o. gentleman with Stage T1c adenocarcinoma of the prostate with Gleason score of 4+5, and PSA of 5.8.  NARRATIVE:  The patient was brought to the [facility].  Identity was confirmed.  All relevant records and images related to the planned course of therapy were reviewed.  The patient freely provided informed written consent to proceed with treatment after reviewing the details related to the planned course of therapy. The consent form was witnessed and verified by the simulation staff.  Then, the patient was set-up in a stable reproducible supine position for radiation therapy.  A vacuum lock pillow device was custom fabricated to position his legs in a reproducible immobilized position.  Then, I performed a urethrogram under sterile conditions to identify the prostatic bed.  CT images were obtained.  Surface markings were placed.  The CT images were loaded into the planning software.  Then the prostate bed target, pelvic lymph node target and avoidance structures including the rectum, bladder, bowel and hips were contoured.  Treatment planning then occurred.  The radiation prescription was entered and confirmed.  A total of one complex treatment devices were fabricated. I have requested : Intensity Modulated Radiotherapy (IMRT) is medically necessary for this case for the following reason:  Rectal sparing.Marland Kitchen  PLAN:  The patient will receive 45 Gy in 25 fractions of 1.8 Gy, followed by a boost to the prostate to a total dose of 75 Gy with 15 additional fractions of 2 Gy.   ________________________________  Sheral Apley Tammi Klippel, M.D.

## 2021-03-13 ENCOUNTER — Encounter: Payer: PPO | Attending: Nurse Practitioner | Admitting: Registered"

## 2021-03-13 ENCOUNTER — Encounter: Payer: Self-pay | Admitting: Registered"

## 2021-03-13 ENCOUNTER — Other Ambulatory Visit: Payer: Self-pay

## 2021-03-13 DIAGNOSIS — E1165 Type 2 diabetes mellitus with hyperglycemia: Secondary | ICD-10-CM | POA: Diagnosis not present

## 2021-03-13 NOTE — Patient Instructions (Signed)
Start using the rule 15 to treat low blood sugar Review the Low fiber handout for ideas for meals while you are undergoing treatment Nutritional supplements: avoid high calorie ones due to high sugar content. The original, high protein or glucose control varieties should be fine. Look at calories if using as a meal replacement. Making smoothies at home include a balance of protein, carb and small amount of healthy fat

## 2021-03-13 NOTE — Progress Notes (Signed)
Diabetes Self-Management Education  Visit Type: Follow-up  Appt. Start Time: 0940 Appt. End Time: [id number]  09/12/2020  [name], identified by name and date of birth, is a 70 y.o. male with a diagnosis of Diabetes:  Type 2.   This patient is accompanied in the office by his spouse.  ASSESSMENT  A1c 6.9%  Metformin 1000 mg/dL bid; glipizide 7.5 mg/dL  There were no vitals taken for this visit. There is no height or weight on file to calculate BMI.   Pt states he is still having hypoglycemic events at 55 mg/dL, but not as often as before. Pt states he might eat sandwich, peppermint, or drink 1/2 of a 20 oz soda.  Patient states he had started exercising regularly after participating in the [place] study and very happy with the experience and being able to stay motivated. However, pt reports that his exercise is going to slow down now due to is recent diagnosis of prostate cancer and will be starting radiation treatment soon. Patient was interested in a low residue diet as recommended by his oncologist office.   Pt states he is interested in knowing about nutritional supplement options for future use during potential nausea, reduced appetite while under going radiation treatment.   Diabetes Self-Management Education - 03/13/21 0944       Visit Information   Visit Type Follow-up      Complications   Fasting Blood glucose range (mg/dL) 70-129   FBS 120-130, 90 pre-meal   Postprandial Blood glucose range (mg/dL) --   not checking after meals     Patient Education   Nutrition management  Other (comment)   how to choose nutritional supplement   Medications Reviewed patients medication for diabetes, action, purpose, timing of dose and side effects.   hypoglycemic risk with glipizide   Acute complications Taught treatment of hypoglycemia - the 15 rule.      Outcomes   Expected Outcomes Demonstrated interest in learning. Expect positive outcomes    Future DMSE 6 months     Program Status Completed      Subsequent Visit   Since your last visit, are you checking your blood glucose at least once a day? Yes             Individualized Plan for Diabetes Self-Management Training:   Learning Objective:  Patient will have a greater understanding of diabetes self-management. Patient education plan is to attend individual and/or group sessions per assessed needs and concerns.   Patient Instructions  Start using the rule 15 to treat low blood sugar Review the Low fiber handout for ideas for meals while you are undergoing treatment Nutritional supplements: avoid high calorie ones due to high sugar content. The original, high protein or glucose control varieties should be fine. Look at calories if using as a meal replacement. Making smoothies at home include a balance of protein, carb and small amount of healthy fat  Expected Outcomes:  Demonstrated interest in learning. Expect positive outcomes  Education material provided: AND low fiber diet  If problems or questions, patient to contact team via:  Phone and MyChart  Future DSME appointment: 6 months

## 2021-03-20 DIAGNOSIS — Z510 Encounter for antineoplastic radiation therapy: Secondary | ICD-10-CM | POA: Diagnosis not present

## 2021-03-20 DIAGNOSIS — C61 Malignant neoplasm of prostate: Secondary | ICD-10-CM | POA: Diagnosis not present

## 2021-03-21 ENCOUNTER — Ambulatory Visit
Admission: RE | Admit: 2021-03-21 | Discharge: 2021-03-21 | Disposition: A | Discharge status: Home / Self Care | Payer: PPO | Source: Ambulatory Visit | Attending: Radiation Oncology | Admitting: Radiation Oncology

## 2021-03-21 ENCOUNTER — Other Ambulatory Visit: Payer: Self-pay

## 2021-03-21 DIAGNOSIS — C61 Malignant neoplasm of prostate: Secondary | ICD-10-CM | POA: Diagnosis not present

## 2021-03-21 DIAGNOSIS — Z510 Encounter for antineoplastic radiation therapy: Secondary | ICD-10-CM | POA: Diagnosis not present

## 2021-03-22 ENCOUNTER — Ambulatory Visit (INDEPENDENT_AMBULATORY_CARE_PROVIDER_SITE_OTHER): Payer: PPO | Admitting: Nurse Practitioner

## 2021-03-22 ENCOUNTER — Ambulatory Visit
Admission: RE | Admit: 2021-03-22 | Discharge: 2021-03-22 | Disposition: A | Discharge status: Home / Self Care | Payer: PPO | Source: Ambulatory Visit | Attending: Radiation Oncology | Admitting: Radiation Oncology

## 2021-03-22 ENCOUNTER — Encounter: Payer: Self-pay | Admitting: Nurse Practitioner

## 2021-03-22 VITALS — BP 124/80 | HR 71 | Temp 97.3°F | Ht 73.0 in | Wt 258.0 lb

## 2021-03-22 DIAGNOSIS — E1142 Type 2 diabetes mellitus with diabetic polyneuropathy: Secondary | ICD-10-CM | POA: Diagnosis not present

## 2021-03-22 DIAGNOSIS — C61 Malignant neoplasm of prostate: Secondary | ICD-10-CM

## 2021-03-22 DIAGNOSIS — Z510 Encounter for antineoplastic radiation therapy: Secondary | ICD-10-CM | POA: Diagnosis not present

## 2021-03-22 NOTE — Progress Notes (Signed)
Careteam: Patient Care Team: [name], NP as PCP - General (Nurse Practitioner) [name], MD as Consulting Physician (Ophthalmology)  PLACE OF SERVICE:  [place] Directive information Does Patient Have a Medical Advance Directive?: No, Would patient like information on creating a medical advance directive?: Yes (MAU/Ambulatory/Procedural Areas - Information given)  No Known Allergies  Chief Complaint  Patient presents with   Medical Management of Chronic Issues    3 month follow-up and discuss need for shingrix, covid #4, and flu vaccine or exclude. Patient would like to discuss radiation treatment and how it will play apart in his health decisions.      HPI: Patient is a 70 y.o. male for diabetes follow up.  Fasting blood sugars generally 120s-130s  Not having as many lows- he is more mindful and now eating mid-day. Eating more bananas. Followed by endocrinology and nutrition.   Start radiation therapy for his prostate cancer. There was some dietary recommendations. Low fat low residue diet.   Had shingles since last follow up- itched a lot, question if it really was shingles.      Review of Systems:  Review of Systems  Constitutional:  Negative for chills, fever and weight loss.  HENT:  Negative for tinnitus.   Respiratory:  Negative for cough, sputum production and shortness of breath.   Cardiovascular:  Negative for chest pain, palpitations and leg swelling.  Gastrointestinal:  Negative for abdominal pain, constipation, diarrhea and heartburn.  Genitourinary:  Negative for dysuria, frequency and urgency.  Musculoskeletal:  Negative for back pain, falls, joint pain and myalgias.  Skin: Negative.   Neurological:  Negative for dizziness and headaches.  Psychiatric/Behavioral:  Negative for depression and memory loss. The patient does not have insomnia.    Past Medical History:  Diagnosis Date   Diabetes mellitus without complication  (HCC)    Edema    GERD (gastroesophageal reflux disease)    Hyperlipidemia    Hypertension    Obesity, unspecified    Problems with sexual function    Prostate cancer ([place])    Sleep apnea    released per MD, no CPAP   Unspecified sleep apnea    Unspecified vitamin D deficiency    Past Surgical History:  Procedure Laterality Date   CATARACT EXTRACTION  1964   Dr.Freed    COLONOSCOPY     HERNIA REPAIR  1987   Right Side, Dr.Kaufman    POLYPECTOMY     PROSTATE BIOPSY     Social History:   reports that he has never smoked. He has never used smokeless tobacco. He reports that he does not drink alcohol and does not use drugs.  Family History  Problem Relation Age of Onset   Colon cancer Mother 63   Breast cancer Sister    Breast cancer Father    Ovarian cancer Sister    Esophageal cancer Neg Hx    Stomach cancer Neg Hx    Rectal cancer Neg Hx    Colon polyps Neg Hx     Medications: Patient's Medications  New Prescriptions   No medications on file  Previous Medications   ASPIRIN 81 MG TABLET    Take 81 mg by mouth daily.   ATORVASTATIN (LIPITOR) 40 MG TABLET    TAKE 1 TABLET BY MOUTH EVERYDAY AT BEDTIME   BLOOD GLUCOSE MONITORING SUPPL (FREESTYLE LITE) DEVI    by Does not apply route.   CALCIUM CARB-CHOLECALCIFEROL (CALCIUM CARBONATE+VITAMIN D PO)  Take by mouth.   ENALAPRIL (VASOTEC) 5 MG TABLET    TAKE 1 TABLET BY MOUTH EVERY DAY   GLIPIZIDE (GLUCOTROL) 5 MG TABLET    Take 0.5 tablets (2.5 mg total) by mouth daily before breakfast AND 1 tablet (5 mg total) daily before supper.   GLUCOSE BLOOD TEST STRIP    Freestyle Lite Test Strip: Use to check blood sugar 2-3 x weekly Dx: E11.9   METFORMIN (GLUCOPHAGE) 1000 MG TABLET    Take 1 tablet (1,000 mg total) by mouth 2 (two) times daily with a meal.   SILDENAFIL (VIAGRA) 100 MG TABLET    0.5-1 tab 1 hour prior to intercourse   TAMSULOSIN (FLOMAX) 0.4 MG CAPS CAPSULE    Take 1 capsule (0.4 mg total) by mouth daily.   Modified Medications   No medications on file  Discontinued Medications   CHOLECALCIFEROL (VITAMIN D) 50 MCG (2000 UT) TABLET    Take 2,000 Units by mouth daily.   TRIAMCINOLONE CREAM (KENALOG) 0.5 %    SMARTSIG:Sparingly Topical Twice Daily PRN    Physical Exam:  Vitals:   03/22/21 0903  BP: 124/80  Pulse: 71  Temp: (!) 97.3 F (36.3 C)  TempSrc: Temporal  SpO2: 97%  Weight: 258 lb (117 kg)  Height: _0  (1.854 m)   Body mass index is 34.04 kg/m. Wt Readings from Last 3 Encounters:  03/22/21 258 lb (117 kg)  01/05/21 261 lb (118.4 kg)  12/29/20 244 lb (110.7 kg)    Physical Exam Constitutional:      General: He is not in acute distress.    Appearance: He is well-developed. He is not diaphoretic.  HENT:     Head: Normocephalic and atraumatic.     Right Ear: External ear normal.     Left Ear: External ear normal.     Mouth/Throat:     Pharynx: No oropharyngeal exudate.  Eyes:     Conjunctiva/sclera: Conjunctivae normal.     Pupils: Pupils are equal, round, and reactive to light.  Cardiovascular:     Rate and Rhythm: Normal rate and regular rhythm.     Heart sounds: Normal heart sounds.  Pulmonary:     Effort: Pulmonary effort is normal.     Breath sounds: Normal breath sounds.  Abdominal:     General: Bowel sounds are normal.     Palpations: Abdomen is soft.  Musculoskeletal:        General: No tenderness.     Cervical back: Normal range of motion and neck supple.     Right lower leg: No edema.     Left lower leg: No edema.  Skin:    General: Skin is warm and dry.  Neurological:     Mental Status: He is alert and oriented to person, place, and time.    Labs reviewed: Basic Metabolic Panel: Recent Labs    06/27/20 1016 12/21/20 0922  NA 136 137  K 4.5 4.4  CL 105 105  CO2 22 22  GLUCOSE 112* 99  BUN 16 16  CREATININE 1.01 0.90  CALCIUM 9.3 9.1   Liver Function Tests: Recent Labs    06/27/20 1016 12/21/20 0922  AST 24 19  ALT 35 27   BILITOT 0.8 0.6  PROT 6.9 6.4   No results for input(s): LIPASE, AMYLASE in the last 8760 hours. No results for input(s): AMMONIA in the last 8760 hours. CBC: Recent Labs    12/21/20 0922  WBC 3.8  NEUTROABS 1,767  HGB 13.0*  HCT 40.1  MCV 82.3  PLT 214   Lipid Panel: Recent Labs    12/21/20 0922  CHOL 109  HDL 30*  LDLCALC 55  TRIG 159*  CHOLHDL 3.6   TSH: No results for input(s): TSH in the last 8760 hours. A1C: Lab Results  Component Value Date   HGBA1C 6.9 (H) 12/21/2020     Assessment/Plan 1. Type 2 diabetes mellitus with diabetic polyneuropathy, without long-term current use of insulin (HCC) -continues to work on dietary modifications, has met with RD -continue with increase in physical activity as able. Encouraged dietary compliance, routine foot care/monitoring and to keep up with diabetic eye exams through ophthalmology. Followed by endocrine but next appt is not until next year.   2. Prostate cancer ([place]) -followed by urology, plans to have 8 weeks of radiation treatments.  -also making dietary changes for part of treatment.   Next appt: 06/23/2021 [facility]. [place], [facility] [phone]

## 2021-03-22 NOTE — Patient Instructions (Addendum)
VACCINE RECOMMENDATIONS (to get at your local pharmacy) -flu -shingles -COVID booster

## 2021-03-23 ENCOUNTER — Other Ambulatory Visit: Payer: Self-pay

## 2021-03-23 ENCOUNTER — Ambulatory Visit
Admission: RE | Admit: 2021-03-23 | Discharge: 2021-03-23 | Disposition: A | Discharge status: Home / Self Care | Payer: PPO | Source: Ambulatory Visit | Attending: Radiation Oncology | Admitting: Radiation Oncology

## 2021-03-23 DIAGNOSIS — C61 Malignant neoplasm of prostate: Secondary | ICD-10-CM | POA: Diagnosis not present

## 2021-03-23 DIAGNOSIS — Z510 Encounter for antineoplastic radiation therapy: Secondary | ICD-10-CM | POA: Diagnosis not present

## 2021-03-24 ENCOUNTER — Ambulatory Visit
Admission: RE | Admit: 2021-03-24 | Discharge: 2021-03-24 | Disposition: A | Discharge status: Home / Self Care | Payer: PPO | Source: Ambulatory Visit | Attending: Radiation Oncology | Admitting: Radiation Oncology

## 2021-03-24 DIAGNOSIS — Z510 Encounter for antineoplastic radiation therapy: Secondary | ICD-10-CM | POA: Diagnosis not present

## 2021-03-24 DIAGNOSIS — C61 Malignant neoplasm of prostate: Secondary | ICD-10-CM | POA: Diagnosis not present

## 2021-03-24 NOTE — Progress Notes (Signed)
Pt here for patient teaching.    Pt given Radiation and You booklet and skin care instructions.    Reviewed areas of pertinence such as diarrhea, fatigue, hair loss, nausea and vomiting, sexual and fertility changes, skin changes, and urinary and bladder changes .   Pt able to give teach back of to pat skin, use unscented/gentle soap, use baby wipes, and have Imodium on hand,avoid applying anything to skin within 4 hours of treatment.   Pt verbalizes understanding of information given and will contact nursing with any questions or concerns.    [email or web address]

## 2021-03-27 ENCOUNTER — Other Ambulatory Visit: Payer: Self-pay

## 2021-03-27 ENCOUNTER — Ambulatory Visit
Admission: RE | Admit: 2021-03-27 | Discharge: 2021-03-27 | Disposition: A | Discharge status: Home / Self Care | Payer: PPO | Source: Ambulatory Visit | Attending: Radiation Oncology | Admitting: Radiation Oncology

## 2021-03-27 DIAGNOSIS — C61 Malignant neoplasm of prostate: Secondary | ICD-10-CM | POA: Diagnosis not present

## 2021-03-27 DIAGNOSIS — Z510 Encounter for antineoplastic radiation therapy: Secondary | ICD-10-CM | POA: Diagnosis not present

## 2021-03-28 ENCOUNTER — Ambulatory Visit
Admission: RE | Admit: 2021-03-28 | Discharge: 2021-03-28 | Disposition: A | Discharge status: Home / Self Care | Payer: PPO | Source: Ambulatory Visit | Attending: Radiation Oncology | Admitting: Radiation Oncology

## 2021-03-28 DIAGNOSIS — Z510 Encounter for antineoplastic radiation therapy: Secondary | ICD-10-CM | POA: Diagnosis not present

## 2021-03-28 DIAGNOSIS — C61 Malignant neoplasm of prostate: Secondary | ICD-10-CM | POA: Diagnosis not present

## 2021-03-29 ENCOUNTER — Other Ambulatory Visit: Payer: Self-pay

## 2021-03-29 ENCOUNTER — Ambulatory Visit
Admission: RE | Admit: 2021-03-29 | Discharge: 2021-03-29 | Disposition: A | Discharge status: Home / Self Care | Payer: PPO | Source: Ambulatory Visit | Attending: Radiation Oncology | Admitting: Radiation Oncology

## 2021-03-29 DIAGNOSIS — C61 Malignant neoplasm of prostate: Secondary | ICD-10-CM | POA: Diagnosis not present

## 2021-03-29 DIAGNOSIS — Z510 Encounter for antineoplastic radiation therapy: Secondary | ICD-10-CM | POA: Diagnosis not present

## 2021-03-30 ENCOUNTER — Ambulatory Visit
Admission: RE | Admit: 2021-03-30 | Discharge: 2021-03-30 | Disposition: A | Discharge status: Home / Self Care | Payer: PPO | Source: Ambulatory Visit | Attending: Radiation Oncology | Admitting: Radiation Oncology

## 2021-03-30 ENCOUNTER — Other Ambulatory Visit: Payer: Self-pay

## 2021-03-30 DIAGNOSIS — C61 Malignant neoplasm of prostate: Secondary | ICD-10-CM | POA: Diagnosis not present

## 2021-03-30 DIAGNOSIS — Z510 Encounter for antineoplastic radiation therapy: Secondary | ICD-10-CM | POA: Diagnosis not present

## 2021-03-31 ENCOUNTER — Ambulatory Visit
Admission: RE | Admit: 2021-03-31 | Discharge: 2021-03-31 | Disposition: A | Discharge status: Home / Self Care | Payer: PPO | Source: Ambulatory Visit | Attending: Radiation Oncology | Admitting: Radiation Oncology

## 2021-03-31 DIAGNOSIS — Z510 Encounter for antineoplastic radiation therapy: Secondary | ICD-10-CM | POA: Diagnosis not present

## 2021-03-31 DIAGNOSIS — C61 Malignant neoplasm of prostate: Secondary | ICD-10-CM | POA: Diagnosis not present

## 2021-04-03 ENCOUNTER — Other Ambulatory Visit: Payer: Self-pay

## 2021-04-03 ENCOUNTER — Ambulatory Visit
Admission: RE | Admit: 2021-04-03 | Discharge: 2021-04-03 | Disposition: A | Discharge status: Home / Self Care | Payer: PPO | Source: Ambulatory Visit | Attending: Radiation Oncology | Admitting: Radiation Oncology

## 2021-04-03 DIAGNOSIS — C61 Malignant neoplasm of prostate: Secondary | ICD-10-CM | POA: Diagnosis not present

## 2021-04-03 DIAGNOSIS — Z510 Encounter for antineoplastic radiation therapy: Secondary | ICD-10-CM | POA: Diagnosis not present

## 2021-04-04 ENCOUNTER — Ambulatory Visit
Admission: RE | Admit: 2021-04-04 | Discharge: 2021-04-04 | Disposition: A | Discharge status: Home / Self Care | Payer: PPO | Source: Ambulatory Visit | Attending: Radiation Oncology | Admitting: Radiation Oncology

## 2021-04-04 DIAGNOSIS — Z510 Encounter for antineoplastic radiation therapy: Secondary | ICD-10-CM | POA: Diagnosis not present

## 2021-04-04 DIAGNOSIS — C61 Malignant neoplasm of prostate: Secondary | ICD-10-CM | POA: Diagnosis not present

## 2021-04-05 ENCOUNTER — Ambulatory Visit
Admission: RE | Admit: 2021-04-05 | Discharge: 2021-04-05 | Disposition: A | Discharge status: Home / Self Care | Payer: PPO | Source: Ambulatory Visit | Attending: Radiation Oncology | Admitting: Radiation Oncology

## 2021-04-05 ENCOUNTER — Other Ambulatory Visit: Payer: Self-pay

## 2021-04-05 DIAGNOSIS — C61 Malignant neoplasm of prostate: Secondary | ICD-10-CM | POA: Diagnosis not present

## 2021-04-05 DIAGNOSIS — Z510 Encounter for antineoplastic radiation therapy: Secondary | ICD-10-CM | POA: Diagnosis not present

## 2021-04-06 ENCOUNTER — Ambulatory Visit
Admission: RE | Admit: 2021-04-06 | Discharge: 2021-04-06 | Disposition: A | Discharge status: Home / Self Care | Payer: PPO | Source: Ambulatory Visit | Attending: Radiation Oncology | Admitting: Radiation Oncology

## 2021-04-06 DIAGNOSIS — Z510 Encounter for antineoplastic radiation therapy: Secondary | ICD-10-CM | POA: Diagnosis not present

## 2021-04-06 DIAGNOSIS — C61 Malignant neoplasm of prostate: Secondary | ICD-10-CM | POA: Diagnosis not present

## 2021-04-07 ENCOUNTER — Ambulatory Visit
Admission: RE | Admit: 2021-04-07 | Discharge: 2021-04-07 | Disposition: A | Discharge status: Home / Self Care | Payer: PPO | Source: Ambulatory Visit | Attending: Radiation Oncology | Admitting: Radiation Oncology

## 2021-04-07 ENCOUNTER — Other Ambulatory Visit: Payer: Self-pay

## 2021-04-07 DIAGNOSIS — Z510 Encounter for antineoplastic radiation therapy: Secondary | ICD-10-CM | POA: Diagnosis not present

## 2021-04-07 DIAGNOSIS — C61 Malignant neoplasm of prostate: Secondary | ICD-10-CM | POA: Diagnosis not present

## 2021-04-11 ENCOUNTER — Ambulatory Visit: Payer: PPO

## 2021-04-12 ENCOUNTER — Other Ambulatory Visit: Payer: Self-pay

## 2021-04-12 ENCOUNTER — Ambulatory Visit
Admission: RE | Admit: 2021-04-12 | Discharge: 2021-04-12 | Disposition: A | Discharge status: Home / Self Care | Payer: PPO | Source: Ambulatory Visit | Attending: Radiation Oncology | Admitting: Radiation Oncology

## 2021-04-12 DIAGNOSIS — C61 Malignant neoplasm of prostate: Secondary | ICD-10-CM | POA: Diagnosis not present

## 2021-04-12 DIAGNOSIS — Z510 Encounter for antineoplastic radiation therapy: Secondary | ICD-10-CM | POA: Diagnosis not present

## 2021-04-13 ENCOUNTER — Ambulatory Visit (INDEPENDENT_AMBULATORY_CARE_PROVIDER_SITE_OTHER): Payer: PPO | Admitting: Nurse Practitioner

## 2021-04-13 ENCOUNTER — Ambulatory Visit
Admission: RE | Admit: 2021-04-13 | Discharge: 2021-04-13 | Disposition: A | Discharge status: Home / Self Care | Payer: PPO | Source: Ambulatory Visit | Attending: Radiation Oncology | Admitting: Radiation Oncology

## 2021-04-13 ENCOUNTER — Ambulatory Visit: Payer: PPO | Admitting: Podiatry

## 2021-04-13 ENCOUNTER — Encounter: Payer: Self-pay | Admitting: Nurse Practitioner

## 2021-04-13 ENCOUNTER — Telehealth: Payer: Self-pay

## 2021-04-13 DIAGNOSIS — C61 Malignant neoplasm of prostate: Secondary | ICD-10-CM | POA: Diagnosis not present

## 2021-04-13 DIAGNOSIS — Z0000 Encounter for general adult medical examination without abnormal findings: Secondary | ICD-10-CM

## 2021-04-13 DIAGNOSIS — Z510 Encounter for antineoplastic radiation therapy: Secondary | ICD-10-CM | POA: Diagnosis not present

## 2021-04-13 NOTE — Patient Instructions (Signed)
Raymond [name] , Thank you for taking time to come for your Medicare Wellness Visit. I appreciate your ongoing commitment to your health goals. Please review the following plan we discussed and let me know if I can assist you in the future.   Screening recommendations/referrals: Colonoscopy up to date Recommended yearly ophthalmology/optometry visit for glaucoma screening and checkup Recommended yearly dental visit for hygiene and checkup  Vaccinations: Influenza vaccine RECOMMENDED at this time Pneumococcal vaccine up to date Tdap vaccine up to date Shingles vaccine RECOMMENDED to get at local pharmacy     Advanced directives: recommend to complete  Conditions/risks identified: age, obesity, complications related to diabetes  Next appointment: 1 year  Preventive Care 69 Years and Older, Male Preventive care refers to lifestyle choices and visits with your health care provider that can promote health and wellness. What does preventive care include? A yearly physical exam. This is also called an annual well check. Dental exams once or twice a year. Routine eye exams. Ask your health care provider how often you should have your eyes checked. Personal lifestyle choices, including: Daily care of your teeth and gums. Regular physical activity. Eating a healthy diet. Avoiding tobacco and drug use. Limiting alcohol use. Practicing safe sex. Taking low doses of aspirin every day. Taking vitamin and mineral supplements as recommended by your health care provider. What happens during an annual well check? The services and screenings done by your health care provider during your annual well check will depend on your age, overall health, lifestyle risk factors, and family history of disease. Counseling  Your health care provider may ask you questions about your: Alcohol use. Tobacco use. Drug use. Emotional well-being. Home and relationship well-being. Sexual activity. Eating  habits. History of falls. Memory and ability to understand (cognition). Work and work Statistician. Screening  You may have the following tests or measurements: Height, weight, and BMI. Blood pressure. Lipid and cholesterol levels. These may be checked every 5 years, or more frequently if you are over 16 years old. Skin check. Lung cancer screening. You may have this screening every year starting at age 66 if you have a 30-pack-year history of smoking and currently smoke or have quit within the past 15 years. Fecal occult blood test (FOBT) of the stool. You may have this test every year starting at age 86. Flexible sigmoidoscopy or colonoscopy. You may have a sigmoidoscopy every 5 years or a colonoscopy every 10 years starting at age 51. Prostate cancer screening. Recommendations will vary depending on your family history and other risks. Hepatitis C blood test. Hepatitis B blood test. Sexually transmitted disease (STD) testing. Diabetes screening. This is done by checking your blood sugar (glucose) after you have not eaten for a while (fasting). You may have this done every 1-3 years. Abdominal aortic aneurysm (AAA) screening. You may need this if you are a current or former smoker. Osteoporosis. You may be screened starting at age 47 if you are at high risk. Talk with your health care provider about your test results, treatment options, and if necessary, the need for more tests. Vaccines  Your health care provider may recommend certain vaccines, such as: Influenza vaccine. This is recommended every year. Tetanus, diphtheria, and acellular pertussis (Tdap, Td) vaccine. You may need a Td booster every 10 years. Zoster vaccine. You may need this after age 108. Pneumococcal 13-valent conjugate (PCV13) vaccine. One dose is recommended after age 32. Pneumococcal polysaccharide (PPSV23) vaccine. One dose is recommended after age 38. Talk  to your health care provider about which screenings and  vaccines you need and how often you need them. This information is not intended to replace advice given to you by your health care provider. Make sure you discuss any questions you have with your health care provider. Document Released: 08/19/2015 Document Revised: 04/11/2016 Document Reviewed: 05/24/2015 Elsevier Interactive Patient Education  2017 [place] Prevention in the Home Falls can cause injuries. They can happen to people of all ages. There are many things you can do to make your home safe and to help prevent falls. What can I do on the outside of my home? Regularly fix the edges of walkways and driveways and fix any cracks. Remove anything that might make you trip as you walk through a door, such as a raised step or threshold. Trim any bushes or trees on the path to your home. Use bright outdoor lighting. Clear any walking paths of anything that might make someone trip, such as rocks or tools. Regularly check to see if handrails are loose or broken. Make sure that both sides of any steps have handrails. Any raised decks and porches should have guardrails on the edges. Have any leaves, snow, or ice cleared regularly. Use sand or salt on walking paths during winter. Clean up any spills in your garage right away. This includes oil or grease spills. What can I do in the bathroom? Use night lights. Install grab bars by the toilet and in the tub and shower. Do not use towel bars as grab bars. Use non-skid mats or decals in the tub or shower. If you need to sit down in the shower, use a plastic, non-slip stool. Keep the floor dry. Clean up any water that spills on the floor as soon as it happens. Remove soap buildup in the tub or shower regularly. Attach bath mats securely with double-sided non-slip rug tape. Do not have throw rugs and other things on the floor that can make you trip. What can I do in the bedroom? Use night lights. Make sure that you have a light by your  bed that is easy to reach. Do not use any sheets or blankets that are too big for your bed. They should not hang down onto the floor. Have a firm chair that has side arms. You can use this for support while you get dressed. Do not have throw rugs and other things on the floor that can make you trip. What can I do in the kitchen? Clean up any spills right away. Avoid walking on wet floors. Keep items that you use a lot in easy-to-reach places. If you need to reach something above you, use a strong step stool that has a grab bar. Keep electrical cords out of the way. Do not use floor polish or wax that makes floors slippery. If you must use wax, use non-skid floor wax. Do not have throw rugs and other things on the floor that can make you trip. What can I do with my stairs? Do not leave any items on the stairs. Make sure that there are handrails on both sides of the stairs and use them. Fix handrails that are broken or loose. Make sure that handrails are as long as the stairways. Check any carpeting to make sure that it is firmly attached to the stairs. Fix any carpet that is loose or worn. Avoid having throw rugs at the top or bottom of the stairs. If you do have throw rugs,  attach them to the floor with carpet tape. Make sure that you have a light switch at the top of the stairs and the bottom of the stairs. If you do not have them, ask someone to add them for you. What else can I do to help prevent falls? Wear shoes that: Do not have high heels. Have rubber bottoms. Are comfortable and fit you well. Are closed at the toe. Do not wear sandals. If you use a stepladder: Make sure that it is fully opened. Do not climb a closed stepladder. Make sure that both sides of the stepladder are locked into place. Ask someone to hold it for you, if possible. Clearly mark and make sure that you can see: Any grab bars or handrails. First and last steps. Where the edge of each step is. Use tools that  help you move around (mobility aids) if they are needed. These include: Canes. Walkers. Scooters. Crutches. Turn on the lights when you go into a dark area. Replace any light bulbs as soon as they burn out. Set up your furniture so you have a clear path. Avoid moving your furniture around. If any of your floors are uneven, fix them. If there are any pets around you, be aware of where they are. Review your medicines with your doctor. Some medicines can make you feel dizzy. This can increase your chance of falling. Ask your doctor what other things that you can do to help prevent falls. This information is not intended to replace advice given to you by your health care provider. Make sure you discuss any questions you have with your health care provider. Document Released: 05/19/2009 Document Revised: 12/29/2015 Document Reviewed: 08/27/2014 Elsevier Interactive Patient Education  2017 [employer].

## 2021-04-13 NOTE — Telephone Encounter (Signed)
[name], Raymond [name] are scheduled for a virtual visit with your provider today.    Just as we do with appointments in the office, we must obtain your consent to participate.  Your consent will be active for this visit and any virtual visit you may have with one of our providers in the next 365 days.    If you have a MyChart account, I can also send a copy of this consent to you electronically.  All virtual visits are billed to your insurance company just like a traditional visit in the office.  As this is a virtual visit, video technology does not allow for your provider to perform a traditional examination.  This may limit your provider's ability to fully assess your condition.  If your provider identifies any concerns that need to be evaluated in person or the need to arrange testing such as labs, EKG, etc, we will make arrangements to do so.    Although advances in technology are sophisticated, we cannot ensure that it will always work on either your end or our end.  If the connection with a video visit is poor, we may have to switch to a telephone visit.  With either a video or telephone visit, we are not always able to ensure that we have a secure connection.   I need to obtain your verbal consent now.   Are you willing to proceed with your visit today?   Raymond [name] has provided verbal consent on 04/13/2021 for a virtual visit (video or telephone).   Carroll Kinds, CMA 04/13/2021  2:13 PM

## 2021-04-13 NOTE — Progress Notes (Signed)
Subjective:   ZYEN ACKER is a 70 y.o. male who presents for Medicare Annual/Subsequent preventive examination.  Review of Systems     Cardiac Risk Factors include: dyslipidemia;hypertension;diabetes mellitus;advanced age (>28mn, >>58women);obesity (BMI >30kg/m2);male gender     Objective:    There were no vitals filed for this visit. There is no height or weight on file to calculate BMI.  Advanced Directives 04/13/2021 03/22/2021 01/05/2021 12/21/2020 06/27/2020 12/25/2019 12/25/2019  Does Patient Have a Medical Advance Directive? No No Yes Yes No No No  Type of Advance Directive - - Living will;Healthcare Power of [place] will - - -  Does patient want to make changes to medical advance directive? - - No - Patient declined No - Patient declined - Yes (MAU/Ambulatory/Procedural Areas - Information given) Yes (MAU/Ambulatory/Procedural Areas - Information given)  Copy of [place]? - - No - copy requested No - copy requested - - -  Would patient like information on creating a medical advance directive? - Yes (MAU/Ambulatory/Procedural Areas - Information given) - - Yes (MAU/Ambulatory/Procedural Areas - Information given) - -    Current Medications (verified) Outpatient Encounter Medications as of 04/13/2021  Medication Sig   aspirin 81 MG tablet Take 81 mg by mouth daily.   atorvastatin (LIPITOR) 40 MG tablet TAKE 1 TABLET BY MOUTH EVERYDAY AT BEDTIME   Calcium Carb-Cholecalciferol (CALCIUM CARBONATE+VITAMIN D PO) Take by mouth.   enalapril (VASOTEC) 5 MG tablet TAKE 1 TABLET BY MOUTH EVERY DAY   glipiZIDE (GLUCOTROL) 5 MG tablet Take 0.5 tablets (2.5 mg total) by mouth daily before breakfast AND 1 tablet (5 mg total) daily before supper.   Leuprolide Acetate (LUPRON IJ) Inject as directed.   metFORMIN (GLUCOPHAGE) 1000 MG tablet Take 1 tablet (1,000 mg total) by mouth 2 (two) times daily with a meal.   sildenafil (VIAGRA)  100 MG tablet 0.5-1 tab 1 hour prior to intercourse   tamsulosin (FLOMAX) 0.4 MG CAPS capsule Take 1 capsule (0.4 mg total) by mouth daily.   Blood Glucose Monitoring Suppl (FREESTYLE LITE) DEVI by Does not apply route.   glucose blood test strip Freestyle Lite Test Strip: Use to check blood sugar 2-3 x weekly Dx: E11.9   No facility-administered encounter medications on file as of 04/13/2021.    Allergies (verified) Patient has no known allergies.   History: Past Medical History:  Diagnosis Date   Diabetes mellitus without complication (HCC)    Edema    GERD (gastroesophageal reflux disease)    Hyperlipidemia    Hypertension    Obesity, unspecified    Problems with sexual function    [place]    Sleep apnea    released per MD, no CPAP   Unspecified sleep apnea    Unspecified vitamin D deficiency    Past Surgical History:  Procedure Laterality Date   CATARACT EXTRACTION  1964   Dr.Freed    COLONOSCOPY     HERNIA REPAIR  1987   Right Side, Dr.Kaufman    POLYPECTOMY     PROSTATE BIOPSY     Family History  Problem Relation Age of Onset   Colon cancer Mother 892  Breast cancer Sister    Breast cancer Father    Ovarian cancer Sister    Esophageal cancer Neg Hx    Stomach cancer Neg Hx    Rectal cancer Neg Hx    Colon polyps Neg Hx    Social History  Socioeconomic History   Marital status: Married    Spouse name: Not on file   Number of children: 0   Years of education: Not on file   Highest education level: Not on file  Occupational History   Not on file  Tobacco Use   Smoking status: Never   Smokeless tobacco: Never  Vaping Use   Vaping Use: Never used  Substance and Sexual Activity   Alcohol use: No   Drug use: No   Sexual activity: Yes  Other Topics Concern   Not on file  Social History Narrative   Not on file   Social Determinants of Health   Financial Resource Strain: Not on file  Food Insecurity: Not on file  Transportation  Needs: Not on file  Physical Activity: Not on file  Stress: Not on file  Social Connections: Not on file    Tobacco Counseling Counseling given: Not Answered   Clinical Intake:  Pre-visit preparation completed: Yes  Pain : No/denies pain     BMI - recorded: 34 Nutritional Risks: Nausea/ vomitting/ diarrhea Diabetes: Yes  How often do you need to have someone help you when you read instructions, pamphlets, or other written materials from your doctor or pharmacy?: 1 - Never  Diabetic?yes         Activities of Daily Living In your present state of health, do you have any difficulty performing the following activities: 04/13/2021  Hearing? N  Vision? N  Difficulty concentrating or making decisions? N  Walking or climbing stairs? N  Dressing or bathing? N  Doing errands, shopping? N  Preparing Food and eating ? N  Using the Toilet? N  In the past six months, have you accidently leaked urine? N  Do you have problems with loss of bowel control? N  Managing your Medications? N  Managing your Finances? N  Housekeeping or managing your Housekeeping? N  Some recent data might be hidden    Patient Care Team: [name], NP as PCP - General (Nurse Practitioner) [name], MD as Consulting Physician (Ophthalmology)  Indicate any recent Medical Services you may have received from other than [facility] providers in the past year (date may be approximate).     Assessment:   This is a routine wellness examination for [facility].  Hearing/Vision screen Hearing Screening - Comments:: Patient has no hearing problems Vision Screening - Comments:: Patient wears glasses. Patient had eye exam December 2021. Patient sees Dr. Herbert Deaner  Dietary issues and exercise activities discussed: Current Exercise Habits: The patient does not participate in regular exercise at present   Goals Addressed   None    Depression Screen PHQ 2/9 Scores 04/13/2021 03/22/2021 06/27/2020 12/25/2019  07/16/2019 06/22/2019 12/15/2018  PHQ - 2 Score 0 0 0 0 0 0 0    Fall Risk Fall Risk  04/13/2021 03/22/2021 06/27/2020 12/25/2019 12/25/2019  Falls in the past year? 0 0 0 0 0  Number falls in past yr: 0 0 0 0 0  Injury with Fall? 0 0 0 0 0  Risk for fall due to : No Fall Risks No Fall Risks - - -  Follow up Falls evaluation completed Falls evaluation completed - - -    FALL RISK PREVENTION PERTAINING TO THE HOME:  Any stairs in or around the home? Yes  If so, are there any without handrails? No  Home free of loose throw rugs in walkways, pet beds, electrical cords, etc? Yes  Adequate lighting in your home to  reduce risk of falls? Yes   ASSISTIVE DEVICES UTILIZED TO PREVENT FALLS:  Life alert? No  Use of a cane, walker or w/c? No  Grab bars in the bathroom? No  Shower chair or bench in shower? Yes  Elevated toilet seat or a handicapped toilet? Yes   TIMED UP AND GO:  Was the test performed? No .    Cognitive Function: MMSE - Mini Mental State Exam 12/25/2019 01/27/2018  Orientation to time 5 5  Orientation to Place 5 5  Registration 3 3  Attention/ Calculation 5 5  Recall 2 3  Language- name 2 objects 2 2  Language- repeat 1 1  Language- follow 3 step command 3 3  Language- read & follow direction 1 1  Write a sentence 1 1  Copy design 1 1  Total score 29 30     6CIT Screen 04/13/2021 12/15/2018  What Year? 0 points 0 points  What month? 0 points 0 points  What time? 0 points 0 points  Count back from 20 0 points 0 points  Months in reverse 0 points 0 points  Repeat phrase 2 points 0 points  Total Score 2 0    Immunizations Immunization History  Administered Date(s) Administered   Fluad Quad(high Dose 65+) 06/22/2019, 06/27/2020   Influenza, High Dose Seasonal PF 08/26/2017   Influenza,inj,Quad PF,6+ Mos 04/01/2013, 07/12/2014, 05/26/2015, 04/17/2016, 05/30/2018   PFIZER(Purple Top)SARS-COV-2 Vaccination 10/02/2019, 10/30/2019, 05/31/2020   Pneumococcal  Conjugate-13 07/19/2016   Pneumococcal Polysaccharide-23 09/20/2011, 01/27/2018   Td 08/06/2002   Tdap 12/01/2013   Zoster, Live 09/16/2012    TDAP status: Up to date  Flu Vaccine status: Due, Education has been provided regarding the importance of this vaccine. Advised may receive this vaccine at local pharmacy or Health Dept. Aware to provide a copy of the vaccination record if obtained from local pharmacy or Health Dept. Verbalized acceptance and understanding.  Pneumococcal vaccine status: Up to date  Covid-19 vaccine status: Information provided on how to obtain vaccines.   Qualifies for Shingles Vaccine? Yes   Zostavax completed No   Shingrix Completed?: No.    Education has been provided regarding the importance of this vaccine. Patient has been advised to call insurance company to determine out of pocket expense if they have not yet received this vaccine. Advised may also receive vaccine at local pharmacy or Health Dept. Verbalized acceptance and understanding.  Screening Tests Health Maintenance  Topic Date Due   Zoster Vaccines- Shingrix (1 of 2) Never done   COVID-19 Vaccine (4 - Booster for Pfizer series) 08/23/2020   INFLUENZA VACCINE  03/06/2021   HEMOGLOBIN A1C  06/23/2021   OPHTHALMOLOGY EXAM  09/22/2021   FOOT EXAM  10/03/2021   COLONOSCOPY (Pts 45-32yr Insurance coverage will need to be confirmed)  07/20/2022   TETANUS/TDAP  12/02/2023   Hepatitis C Screening  Completed   PNA vac Low Risk Adult  Completed   HPV VACCINES  Aged Out    Health Maintenance  Health Maintenance Due  Topic Date Due   Zoster Vaccines- Shingrix (1 of 2) Never done   COVID-19 Vaccine (4 - Booster for Pfizer series) 08/23/2020   INFLUENZA VACCINE  03/06/2021    Colorectal cancer screening: Type of screening: Colonoscopy. Completed 2020. Repeat every 3 years  Lung Cancer Screening: (Low Dose CT Chest recommended if Age 69-80years, 30 pack-year currently smoking OR have quit w/in  15years.) does not qualify.   Lung Cancer Screening Referral: na  Additional Screening:  Hepatitis C Screening: does qualify; Completed   Vision Screening: Recommended annual ophthalmology exams for early detection of glaucoma and other disorders of the eye. Is the patient up to date with their annual eye exam?  Yes  Who is the provider or what is the name of the office in which the patient attends annual eye exams? [facility] Deaner If pt is not established with a provider, would they like to be referred to a provider to establish care? No .   Dental Screening: Recommended annual dental exams for proper oral hygiene  Community Resource Referral / Chronic Care Management: CRR required this visit?  No   CCM required this visit?  No      Plan:     I have personally reviewed and noted the following in the patient's chart:   Medical and social history Use of alcohol, tobacco or illicit drugs  Current medications and supplements including opioid prescriptions. Patient is not currently taking opioid prescriptions. Functional ability and status Nutritional status Physical activity Advanced directives List of other physicians Hospitalizations, surgeries, and ER visits in previous 12 months Vitals Screenings to include cognitive, depression, and falls Referrals and appointments  In addition, I have reviewed and discussed with patient certain preventive protocols, quality metrics, and best practice recommendations. A written personalized care plan for preventive services as well as general preventive health recommendations were provided to patient.     [name], NP   04/13/2021    Virtual Visit via Telephone Note  I connected withNAME@ on 04/13/21 at  2:15 PM EDT by telephone and verified that I am speaking with the correct person using two identifiers.  Location: Patient: home Provider: [place]   I discussed the limitations, risks, security and privacy concerns of  performing an evaluation and management service by telephone and the availability of in person appointments. I also discussed with the patient that there may be a patient responsible charge related to this service. The patient expressed understanding and agreed to proceed.   I discussed the assessment and treatment plan with the patient. The patient was provided an opportunity to ask questions and all were answered. The patient agreed with the plan and demonstrated an understanding of the instructions.   The patient was advised to call back or seek an in-person evaluation if the symptoms worsen or if the condition fails to improve as anticipated.  I provided 18 minutes of non-face-to-face time during this encounter.  Carlos American. Harle Battiest Avs printed and mailed

## 2021-04-13 NOTE — Progress Notes (Signed)
This service is provided via telemedicine  No vital signs collected/recorded due to the encounter was a telemedicine visit.   Location of patient (ex: [place], [place]  Patient consents to a telephone visit:  Yes, see encounter dated 04/13/2021  Location of the provider (ex: office, home):  [place]  Name of any referring provider:  N/A  Names of all persons participating in the telemedicine service and their role in the encounter:  Sherrie Mustache, Nurse Practitioner, Carroll Kinds, CMA, and patient.   Time spent on call:  11 minutes with medical assistant

## 2021-04-14 ENCOUNTER — Ambulatory Visit
Admission: RE | Admit: 2021-04-14 | Discharge: 2021-04-14 | Disposition: A | Discharge status: Home / Self Care | Payer: PPO | Source: Ambulatory Visit | Attending: Radiation Oncology | Admitting: Radiation Oncology

## 2021-04-14 ENCOUNTER — Other Ambulatory Visit: Payer: Self-pay

## 2021-04-14 DIAGNOSIS — C61 Malignant neoplasm of prostate: Secondary | ICD-10-CM | POA: Diagnosis not present

## 2021-04-14 DIAGNOSIS — Z510 Encounter for antineoplastic radiation therapy: Secondary | ICD-10-CM | POA: Diagnosis not present

## 2021-04-17 ENCOUNTER — Ambulatory Visit
Admission: RE | Admit: 2021-04-17 | Discharge: 2021-04-17 | Disposition: A | Discharge status: Home / Self Care | Payer: PPO | Source: Ambulatory Visit | Attending: Radiation Oncology | Admitting: Radiation Oncology

## 2021-04-17 ENCOUNTER — Other Ambulatory Visit: Payer: Self-pay

## 2021-04-17 DIAGNOSIS — C61 Malignant neoplasm of prostate: Secondary | ICD-10-CM | POA: Diagnosis not present

## 2021-04-17 DIAGNOSIS — Z510 Encounter for antineoplastic radiation therapy: Secondary | ICD-10-CM | POA: Diagnosis not present

## 2021-04-18 ENCOUNTER — Ambulatory Visit
Admission: RE | Admit: 2021-04-18 | Discharge: 2021-04-18 | Disposition: A | Discharge status: Home / Self Care | Payer: PPO | Source: Ambulatory Visit | Attending: Radiation Oncology | Admitting: Radiation Oncology

## 2021-04-18 DIAGNOSIS — Z510 Encounter for antineoplastic radiation therapy: Secondary | ICD-10-CM | POA: Diagnosis not present

## 2021-04-18 DIAGNOSIS — C61 Malignant neoplasm of prostate: Secondary | ICD-10-CM | POA: Diagnosis not present

## 2021-04-19 ENCOUNTER — Other Ambulatory Visit: Payer: Self-pay

## 2021-04-19 ENCOUNTER — Ambulatory Visit
Admission: RE | Admit: 2021-04-19 | Discharge: 2021-04-19 | Disposition: A | Discharge status: Home / Self Care | Payer: PPO | Source: Ambulatory Visit | Attending: Radiation Oncology | Admitting: Radiation Oncology

## 2021-04-19 DIAGNOSIS — Z510 Encounter for antineoplastic radiation therapy: Secondary | ICD-10-CM | POA: Diagnosis not present

## 2021-04-19 DIAGNOSIS — C61 Malignant neoplasm of prostate: Secondary | ICD-10-CM | POA: Diagnosis not present

## 2021-04-20 ENCOUNTER — Ambulatory Visit
Admission: RE | Admit: 2021-04-20 | Discharge: 2021-04-20 | Disposition: A | Discharge status: Home / Self Care | Payer: PPO | Source: Ambulatory Visit | Attending: Radiation Oncology | Admitting: Radiation Oncology

## 2021-04-20 DIAGNOSIS — Z510 Encounter for antineoplastic radiation therapy: Secondary | ICD-10-CM | POA: Diagnosis not present

## 2021-04-20 DIAGNOSIS — C61 Malignant neoplasm of prostate: Secondary | ICD-10-CM | POA: Diagnosis not present

## 2021-04-21 ENCOUNTER — Ambulatory Visit
Admission: RE | Admit: 2021-04-21 | Discharge: 2021-04-21 | Disposition: A | Discharge status: Home / Self Care | Payer: PPO | Source: Ambulatory Visit | Attending: Radiation Oncology | Admitting: Radiation Oncology

## 2021-04-21 ENCOUNTER — Other Ambulatory Visit: Payer: Self-pay

## 2021-04-21 DIAGNOSIS — Z510 Encounter for antineoplastic radiation therapy: Secondary | ICD-10-CM | POA: Diagnosis not present

## 2021-04-21 DIAGNOSIS — C61 Malignant neoplasm of prostate: Secondary | ICD-10-CM | POA: Diagnosis not present

## 2021-04-24 ENCOUNTER — Ambulatory Visit
Admission: RE | Admit: 2021-04-24 | Discharge: 2021-04-24 | Disposition: A | Discharge status: Home / Self Care | Payer: PPO | Source: Ambulatory Visit | Attending: Radiation Oncology | Admitting: Radiation Oncology

## 2021-04-24 ENCOUNTER — Other Ambulatory Visit: Payer: Self-pay

## 2021-04-24 DIAGNOSIS — Z510 Encounter for antineoplastic radiation therapy: Secondary | ICD-10-CM | POA: Diagnosis not present

## 2021-04-24 DIAGNOSIS — C61 Malignant neoplasm of prostate: Secondary | ICD-10-CM | POA: Diagnosis not present

## 2021-04-25 ENCOUNTER — Ambulatory Visit
Admission: RE | Admit: 2021-04-25 | Discharge: 2021-04-25 | Disposition: A | Discharge status: Home / Self Care | Payer: PPO | Source: Ambulatory Visit | Attending: Radiation Oncology | Admitting: Radiation Oncology

## 2021-04-25 DIAGNOSIS — C61 Malignant neoplasm of prostate: Secondary | ICD-10-CM | POA: Diagnosis not present

## 2021-04-25 DIAGNOSIS — Z510 Encounter for antineoplastic radiation therapy: Secondary | ICD-10-CM | POA: Diagnosis not present

## 2021-04-26 ENCOUNTER — Ambulatory Visit
Admission: RE | Admit: 2021-04-26 | Discharge: 2021-04-26 | Disposition: A | Discharge status: Home / Self Care | Payer: PPO | Source: Ambulatory Visit | Attending: Radiation Oncology | Admitting: Radiation Oncology

## 2021-04-26 ENCOUNTER — Other Ambulatory Visit: Payer: Self-pay

## 2021-04-26 DIAGNOSIS — C61 Malignant neoplasm of prostate: Secondary | ICD-10-CM | POA: Diagnosis not present

## 2021-04-26 DIAGNOSIS — Z510 Encounter for antineoplastic radiation therapy: Secondary | ICD-10-CM | POA: Diagnosis not present

## 2021-04-27 ENCOUNTER — Ambulatory Visit
Admission: RE | Admit: 2021-04-27 | Discharge: 2021-04-27 | Disposition: A | Discharge status: Home / Self Care | Payer: PPO | Source: Ambulatory Visit | Attending: Radiation Oncology | Admitting: Radiation Oncology

## 2021-04-27 DIAGNOSIS — C61 Malignant neoplasm of prostate: Secondary | ICD-10-CM | POA: Diagnosis not present

## 2021-04-27 DIAGNOSIS — Z510 Encounter for antineoplastic radiation therapy: Secondary | ICD-10-CM | POA: Diagnosis not present

## 2021-04-28 ENCOUNTER — Ambulatory Visit
Admission: RE | Admit: 2021-04-28 | Discharge: 2021-04-28 | Disposition: A | Discharge status: Home / Self Care | Payer: PPO | Source: Ambulatory Visit | Attending: Radiation Oncology | Admitting: Radiation Oncology

## 2021-04-28 ENCOUNTER — Other Ambulatory Visit: Payer: Self-pay

## 2021-04-28 DIAGNOSIS — C61 Malignant neoplasm of prostate: Secondary | ICD-10-CM | POA: Diagnosis not present

## 2021-04-28 DIAGNOSIS — Z510 Encounter for antineoplastic radiation therapy: Secondary | ICD-10-CM | POA: Diagnosis not present

## 2021-05-01 ENCOUNTER — Ambulatory Visit (INDEPENDENT_AMBULATORY_CARE_PROVIDER_SITE_OTHER): Payer: PPO | Admitting: Orthopedic Surgery

## 2021-05-01 ENCOUNTER — Ambulatory Visit
Admission: RE | Admit: 2021-05-01 | Discharge: 2021-05-01 | Disposition: A | Discharge status: Home / Self Care | Payer: PPO | Source: Ambulatory Visit | Attending: Radiation Oncology | Admitting: Radiation Oncology

## 2021-05-01 ENCOUNTER — Other Ambulatory Visit: Payer: Self-pay

## 2021-05-01 VITALS — BP 130/90 | HR 78 | Temp 97.0°F | Ht 73.0 in | Wt 264.6 lb

## 2021-05-01 DIAGNOSIS — C61 Malignant neoplasm of prostate: Secondary | ICD-10-CM | POA: Diagnosis not present

## 2021-05-01 DIAGNOSIS — R600 Localized edema: Secondary | ICD-10-CM

## 2021-05-01 DIAGNOSIS — Z510 Encounter for antineoplastic radiation therapy: Secondary | ICD-10-CM | POA: Diagnosis not present

## 2021-05-01 NOTE — Progress Notes (Signed)
Careteam: Patient Care Team: [name], NP as PCP - General (Nurse Practitioner) [name], MD as Consulting Physician (Ophthalmology)  Seen by: Windell Moulding, AGNP-C  PLACE OF SERVICE:  [place] Directive information    No Known Allergies  Chief Complaint  Patient presents with   Acute Visit    Swelling in right leg. Patient has swelling off and on. No pain     HPI: Patient is a 70 y.o. male seen today for acute visit due to right leg swelling.   Wife noticed selling in his right leg last night and wanted him to be seen by provider. Swelling near ankle.Denies ankle pain, injury or wounds. He has had intermittent swelling to his lower legs in the past. Admits to eating restaurant foods over the weekend and fried chicken. Does not exercise at this time. Cardiac/respiratory history includes hypertension and OSA. Does not follow a low sodium diet. Denies chest pain, orthopnea and sob. Afebrile.    Review of Systems:  Review of Systems  Constitutional:  Negative for chills, fever, malaise/fatigue and weight loss.  Respiratory:  Negative for cough, shortness of breath and wheezing.   Cardiovascular:  Positive for leg swelling. Negative for chest pain.  Musculoskeletal:  Positive for myalgias. Negative for falls and joint pain.  Skin: Negative.   Psychiatric/Behavioral:  Negative for depression. The patient is not nervous/anxious.    Past Medical History:  Diagnosis Date   Diabetes mellitus without complication (HCC)    Edema    GERD (gastroesophageal reflux disease)    Hyperlipidemia    Hypertension    Obesity, unspecified    Problems with sexual function    Prostate cancer ([place])    Sleep apnea    released per MD, no CPAP   Unspecified sleep apnea    Unspecified vitamin D deficiency    Past Surgical History:  Procedure Laterality Date   CATARACT EXTRACTION  1964   Dr.Freed    COLONOSCOPY     HERNIA REPAIR  1987   Right Side, Dr.Kaufman     POLYPECTOMY     PROSTATE BIOPSY     Social History:   reports that he has never smoked. He has never used smokeless tobacco. He reports that he does not drink alcohol and does not use drugs.  Family History  Problem Relation Age of Onset   Colon cancer Mother 7   Breast cancer Sister    Breast cancer Father    Ovarian cancer Sister    Esophageal cancer Neg Hx    Stomach cancer Neg Hx    Rectal cancer Neg Hx    Colon polyps Neg Hx     Medications: Patient's Medications  New Prescriptions   No medications on file  Previous Medications   ASPIRIN 81 MG TABLET    Take 81 mg by mouth daily.   ATORVASTATIN (LIPITOR) 40 MG TABLET    TAKE 1 TABLET BY MOUTH EVERYDAY AT BEDTIME   BLOOD GLUCOSE MONITORING SUPPL (FREESTYLE LITE) DEVI    by Does not apply route.   CALCIUM CARB-CHOLECALCIFEROL (CALCIUM CARBONATE+VITAMIN D PO)    Take by mouth.   ENALAPRIL (VASOTEC) 5 MG TABLET    TAKE 1 TABLET BY MOUTH EVERY DAY   GLIPIZIDE (GLUCOTROL) 5 MG TABLET    Take 0.5 tablets (2.5 mg total) by mouth daily before breakfast AND 1 tablet (5 mg total) daily before supper.   GLUCOSE BLOOD TEST STRIP    Freestyle Lite Test  Strip: Use to check blood sugar 2-3 x weekly Dx: E11.9   LEUPROLIDE ACETATE (LUPRON IJ)    Inject as directed.   METFORMIN (GLUCOPHAGE) 1000 MG TABLET    Take 1 tablet (1,000 mg total) by mouth 2 (two) times daily with a meal.   SILDENAFIL (VIAGRA) 100 MG TABLET    0.5-1 tab 1 hour prior to intercourse   TAMSULOSIN (FLOMAX) 0.4 MG CAPS CAPSULE    Take 1 capsule (0.4 mg total) by mouth daily.  Modified Medications   No medications on file  Discontinued Medications   No medications on file    Physical Exam:  Vitals:   05/01/21 1504  BP: 130/90  Pulse: 78  Temp: (!) 97 F (36.1 C)  SpO2: 97%  Weight: 264 lb 9.6 oz (120 kg)  Height: 6\' 1"  (1.854 m)   Body mass index is 34.91 kg/m. Wt Readings from Last 3 Encounters:  05/01/21 264 lb 9.6 oz (120 kg)  03/22/21 258 lb (117  kg)  01/05/21 261 lb (118.4 kg)    Physical Exam Vitals reviewed.  Constitutional:      General: He is not in acute distress. HENT:     Head: Normocephalic.  Cardiovascular:     Rate and Rhythm: Normal rate and regular rhythm.     Pulses: Normal pulses.     Heart sounds: Normal heart sounds. No murmur heard. Pulmonary:     Effort: Pulmonary effort is normal. No respiratory distress.     Breath sounds: Normal breath sounds. No wheezing.  Abdominal:     General: Bowel sounds are normal. There is no distension.     Palpations: Abdomen is soft.     Tenderness: There is no abdominal tenderness.  Musculoskeletal:     Right lower leg: Edema present.     Left lower leg: Edema present.     Comments: 1+ pitting  Skin:    General: Skin is warm and dry.     Capillary Refill: Capillary refill takes less than 2 seconds.  Neurological:     General: No focal deficit present.     Mental Status: He is alert and oriented to person, place, and time.  Psychiatric:        Mood and Affect: Mood normal.        Behavior: Behavior normal.    Labs reviewed: Basic Metabolic Panel: Recent Labs    06/27/20 1016 12/21/20 0922  NA 136 137  K 4.5 4.4  CL 105 105  CO2 22 22  GLUCOSE 112* 99  BUN 16 16  CREATININE 1.01 0.90  CALCIUM 9.3 9.1   Liver Function Tests: Recent Labs    06/27/20 1016 12/21/20 0922  AST 24 19  ALT 35 27  BILITOT 0.8 0.6  PROT 6.9 6.4   No results for input(s): LIPASE, AMYLASE in the last 8760 hours. No results for input(s): AMMONIA in the last 8760 hours. CBC: Recent Labs    12/21/20 0922  WBC 3.8  NEUTROABS 1,767  HGB 13.0*  HCT 40.1  MCV 82.3  PLT 214   Lipid Panel: Recent Labs    12/21/20 0922  CHOL 109  HDL 30*  LDLCALC 55  TRIG 159*  CHOLHDL 3.6   TSH: No results for input(s): TSH in the last 8760 hours. A1C: Lab Results  Component Value Date   HGBA1C 6.9 (H) 12/21/2020     Assessment/Plan 1. Bilateral edema of lower  extremity - 1+ pitting edema to BLE, vitals stable -  suspect due to diet high in sodium and inactivity - recommend low sodium diet < 2000 mg daily - advise elevating BLE 2-4 hours daily - recommend wearing compressing stockings 12 hrs on/12 hrs off  Total time: 20 minutes. Greater than 50%of total time spent doing patient education on low sodium diet and symptom management   Next appt: none Halley Shepheard [place], [facility] [phone]

## 2021-05-01 NOTE — Patient Instructions (Addendum)
Low sodium diet  Elevate legs 2-4 hours a day  Recommend wearing compression stockings daily- 12 hours on/ 12 hours off

## 2021-05-02 ENCOUNTER — Ambulatory Visit
Admission: RE | Admit: 2021-05-02 | Discharge: 2021-05-02 | Disposition: A | Discharge status: Home / Self Care | Payer: PPO | Source: Ambulatory Visit | Attending: Radiation Oncology | Admitting: Radiation Oncology

## 2021-05-02 ENCOUNTER — Encounter: Payer: Self-pay | Admitting: Orthopedic Surgery

## 2021-05-02 DIAGNOSIS — Z510 Encounter for antineoplastic radiation therapy: Secondary | ICD-10-CM | POA: Diagnosis not present

## 2021-05-02 DIAGNOSIS — C61 Malignant neoplasm of prostate: Secondary | ICD-10-CM | POA: Diagnosis not present

## 2021-05-03 ENCOUNTER — Ambulatory Visit
Admission: RE | Admit: 2021-05-03 | Discharge: 2021-05-03 | Disposition: A | Discharge status: Home / Self Care | Payer: PPO | Source: Ambulatory Visit | Attending: Radiation Oncology | Admitting: Radiation Oncology

## 2021-05-03 DIAGNOSIS — C61 Malignant neoplasm of prostate: Secondary | ICD-10-CM | POA: Diagnosis not present

## 2021-05-03 DIAGNOSIS — Z510 Encounter for antineoplastic radiation therapy: Secondary | ICD-10-CM | POA: Diagnosis not present

## 2021-05-04 ENCOUNTER — Ambulatory Visit
Admission: RE | Admit: 2021-05-04 | Discharge: 2021-05-04 | Disposition: A | Discharge status: Home / Self Care | Payer: PPO | Source: Ambulatory Visit | Attending: Radiation Oncology | Admitting: Radiation Oncology

## 2021-05-04 ENCOUNTER — Other Ambulatory Visit: Payer: Self-pay

## 2021-05-04 DIAGNOSIS — C61 Malignant neoplasm of prostate: Secondary | ICD-10-CM | POA: Diagnosis not present

## 2021-05-04 DIAGNOSIS — Z510 Encounter for antineoplastic radiation therapy: Secondary | ICD-10-CM | POA: Diagnosis not present

## 2021-05-05 ENCOUNTER — Ambulatory Visit
Admission: RE | Admit: 2021-05-05 | Discharge: 2021-05-05 | Disposition: A | Discharge status: Home / Self Care | Payer: PPO | Source: Ambulatory Visit | Attending: Radiation Oncology | Admitting: Radiation Oncology

## 2021-05-05 DIAGNOSIS — C61 Malignant neoplasm of prostate: Secondary | ICD-10-CM | POA: Diagnosis not present

## 2021-05-05 DIAGNOSIS — Z510 Encounter for antineoplastic radiation therapy: Secondary | ICD-10-CM | POA: Diagnosis not present

## 2021-05-08 ENCOUNTER — Other Ambulatory Visit: Payer: Self-pay

## 2021-05-08 ENCOUNTER — Ambulatory Visit
Admission: RE | Admit: 2021-05-08 | Discharge: 2021-05-08 | Disposition: A | Discharge status: Home / Self Care | Payer: PPO | Source: Ambulatory Visit | Attending: Radiation Oncology | Admitting: Radiation Oncology

## 2021-05-08 DIAGNOSIS — Z510 Encounter for antineoplastic radiation therapy: Secondary | ICD-10-CM | POA: Diagnosis not present

## 2021-05-08 DIAGNOSIS — C61 Malignant neoplasm of prostate: Secondary | ICD-10-CM | POA: Insufficient documentation

## 2021-05-09 ENCOUNTER — Ambulatory Visit
Admission: RE | Admit: 2021-05-09 | Discharge: 2021-05-09 | Disposition: A | Discharge status: Home / Self Care | Payer: PPO | Source: Ambulatory Visit | Attending: Radiation Oncology | Admitting: Radiation Oncology

## 2021-05-09 DIAGNOSIS — C61 Malignant neoplasm of prostate: Secondary | ICD-10-CM | POA: Diagnosis not present

## 2021-05-09 DIAGNOSIS — Z510 Encounter for antineoplastic radiation therapy: Secondary | ICD-10-CM | POA: Diagnosis not present

## 2021-05-10 ENCOUNTER — Other Ambulatory Visit: Payer: Self-pay

## 2021-05-10 ENCOUNTER — Ambulatory Visit
Admission: RE | Admit: 2021-05-10 | Discharge: 2021-05-10 | Disposition: A | Discharge status: Home / Self Care | Payer: PPO | Source: Ambulatory Visit | Attending: Radiation Oncology | Admitting: Radiation Oncology

## 2021-05-10 DIAGNOSIS — Z510 Encounter for antineoplastic radiation therapy: Secondary | ICD-10-CM | POA: Diagnosis not present

## 2021-05-10 DIAGNOSIS — C61 Malignant neoplasm of prostate: Secondary | ICD-10-CM | POA: Diagnosis not present

## 2021-05-11 ENCOUNTER — Ambulatory Visit
Admission: RE | Admit: 2021-05-11 | Discharge: 2021-05-11 | Disposition: A | Discharge status: Home / Self Care | Payer: PPO | Source: Ambulatory Visit | Attending: Radiation Oncology | Admitting: Radiation Oncology

## 2021-05-11 DIAGNOSIS — Z510 Encounter for antineoplastic radiation therapy: Secondary | ICD-10-CM | POA: Diagnosis not present

## 2021-05-11 DIAGNOSIS — C61 Malignant neoplasm of prostate: Secondary | ICD-10-CM | POA: Diagnosis not present

## 2021-05-12 ENCOUNTER — Ambulatory Visit
Admission: RE | Admit: 2021-05-12 | Discharge: 2021-05-12 | Disposition: A | Discharge status: Home / Self Care | Payer: PPO | Source: Ambulatory Visit | Attending: Radiation Oncology | Admitting: Radiation Oncology

## 2021-05-12 ENCOUNTER — Other Ambulatory Visit: Payer: Self-pay

## 2021-05-12 DIAGNOSIS — Z510 Encounter for antineoplastic radiation therapy: Secondary | ICD-10-CM | POA: Diagnosis not present

## 2021-05-12 DIAGNOSIS — C61 Malignant neoplasm of prostate: Secondary | ICD-10-CM | POA: Diagnosis not present

## 2021-05-15 ENCOUNTER — Ambulatory Visit
Admission: RE | Admit: 2021-05-15 | Discharge: 2021-05-15 | Disposition: A | Discharge status: Home / Self Care | Payer: PPO | Source: Ambulatory Visit | Attending: Radiation Oncology | Admitting: Radiation Oncology

## 2021-05-15 ENCOUNTER — Other Ambulatory Visit: Payer: Self-pay

## 2021-05-15 DIAGNOSIS — C61 Malignant neoplasm of prostate: Secondary | ICD-10-CM | POA: Diagnosis not present

## 2021-05-15 DIAGNOSIS — Z510 Encounter for antineoplastic radiation therapy: Secondary | ICD-10-CM | POA: Diagnosis not present

## 2021-05-16 ENCOUNTER — Ambulatory Visit: Payer: PPO

## 2021-05-16 ENCOUNTER — Ambulatory Visit
Admission: RE | Admit: 2021-05-16 | Discharge: 2021-05-16 | Disposition: A | Discharge status: Home / Self Care | Payer: PPO | Source: Ambulatory Visit | Attending: Radiation Oncology | Admitting: Radiation Oncology

## 2021-05-16 DIAGNOSIS — Z510 Encounter for antineoplastic radiation therapy: Secondary | ICD-10-CM | POA: Diagnosis not present

## 2021-05-16 DIAGNOSIS — C61 Malignant neoplasm of prostate: Secondary | ICD-10-CM | POA: Diagnosis not present

## 2021-05-17 ENCOUNTER — Other Ambulatory Visit: Payer: Self-pay

## 2021-05-17 ENCOUNTER — Ambulatory Visit
Admission: RE | Admit: 2021-05-17 | Discharge: 2021-05-17 | Disposition: A | Discharge status: Home / Self Care | Payer: PPO | Source: Ambulatory Visit | Attending: Radiation Oncology | Admitting: Radiation Oncology

## 2021-05-17 ENCOUNTER — Ambulatory Visit: Payer: PPO

## 2021-05-17 ENCOUNTER — Encounter: Payer: Self-pay | Admitting: Urology

## 2021-05-17 DIAGNOSIS — C61 Malignant neoplasm of prostate: Secondary | ICD-10-CM

## 2021-05-17 DIAGNOSIS — Z510 Encounter for antineoplastic radiation therapy: Secondary | ICD-10-CM | POA: Diagnosis not present

## 2021-05-29 ENCOUNTER — Other Ambulatory Visit: Payer: Self-pay

## 2021-05-29 ENCOUNTER — Ambulatory Visit: Payer: PPO | Admitting: Podiatry

## 2021-05-29 ENCOUNTER — Encounter: Payer: Self-pay | Admitting: Podiatry

## 2021-05-29 DIAGNOSIS — B351 Tinea unguium: Secondary | ICD-10-CM | POA: Diagnosis not present

## 2021-05-29 DIAGNOSIS — M79675 Pain in left toe(s): Secondary | ICD-10-CM

## 2021-05-29 DIAGNOSIS — E1165 Type 2 diabetes mellitus with hyperglycemia: Secondary | ICD-10-CM

## 2021-05-29 DIAGNOSIS — M79674 Pain in right toe(s): Secondary | ICD-10-CM | POA: Diagnosis not present

## 2021-05-29 NOTE — Progress Notes (Signed)
This patient returns to my office for at risk foot care.  This patient requires this care by a professional since this patient will be at risk due to having diabetes.  This patient is unable to cut nails himself since the patient cannot reach his nails.These nails are painful walking and wearing shoes.  This patient presents for at risk foot care today. ? ?General Appearance  Alert, conversant and in no acute stress. ? ?Vascular  Dorsalis pedis and posterior tibial  pulses are palpable  bilaterally.  Capillary return is within normal limits  bilaterally. Temperature is within normal limits  bilaterally. ? ?Neurologic  Senn-Weinstein monofilament wire test within normal limits  bilaterally. Muscle power within normal limits bilaterally. ? ?Nails Thick disfigured discolored nails with subungual debris  from hallux to fifth toes bilaterally. No evidence of bacterial infection or drainage bilaterally. ? ?Orthopedic  No limitations of motion  feet .  No crepitus or effusions noted.  No bony pathology or digital deformities noted. Plantarflexed fifth met left foot.  ? ?Skin  normotropic skin with no porokeratosis noted bilaterally.  No signs of infections or ulcers noted.   Asymptomatic porokeratosis. ? ?Onychomycosis  Pain in right toes  Pain in left toes ? ?Consent was obtained for treatment procedures.   Mechanical debridement of nails 1-5  bilaterally performed with a nail nipper.  Filed with dremel without incident.  ? ? ?Return office visit    4   months                 Told patient to return for periodic foot care and evaluation due to potential at risk complications. ? ? ?Meyah Corle DPM  ?

## 2021-06-15 ENCOUNTER — Other Ambulatory Visit: Payer: Self-pay | Admitting: Nurse Practitioner

## 2021-06-15 DIAGNOSIS — E785 Hyperlipidemia, unspecified: Secondary | ICD-10-CM

## 2021-06-20 NOTE — Progress Notes (Signed)
  Radiation Oncology         [phone] ________________________________  Name: Raymond [name] MRN: [id number]  Date: 05/17/2021  DOB: 1951/05/09  End of Treatment Note  Diagnosis:   70 y.o. gentleman with Stage T1c adenocarcinoma of the prostate with Gleason score of 4+5, and PSA of 5.8.     Indication for treatment:  Curative, Definitive Radiotherapy       Radiation treatment dates:   03/21/21 - 05/17/21 (concurrent with ADT started 01/20/21)  Site/dose:  1. The prostate, seminal vesicles, and pelvic lymph nodes were initially treated to 45 Gy in 25 fractions of 1.8 Gy  2. The prostate only was boosted to 75 Gy with 15 additional fractions of 2.0 Gy   Beams/energy:  1. The prostate, seminal vesicles, and pelvic lymph nodes were initially treated using VMAT intensity modulated radiotherapy delivering 6 megavolt photons. Image guidance was performed with CB-CT studies prior to each fraction. He was immobilized with a body fix lower extremity mold.  2. the prostate only was boosted using VMAT intensity modulated radiotherapy delivering 6 megavolt photons. Image guidance was performed with CB-CT studies prior to each fraction. He was immobilized with a body fix lower extremity mold.  Narrative: The patient tolerated radiation treatment relatively well with only minor urinary irritation and modest fatigue.  He reported nocturia x4, weak stream, increased frequency and urgency but denied gross hematuria, dysuria, or bowel issues. He did increase his Flomax to BID with improvement in his LUTS.  Plan: The patient has completed radiation treatment. He will return to radiation oncology clinic for routine followup in one month. I advised him to call or return sooner if he has any questions or concerns related to his recovery or treatment. ________________________________  Sheral Apley. Tammi Klippel, M.D.

## 2021-06-21 ENCOUNTER — Other Ambulatory Visit: Payer: Self-pay | Admitting: Urology

## 2021-06-21 ENCOUNTER — Encounter: Payer: Self-pay | Admitting: Urology

## 2021-06-21 ENCOUNTER — Inpatient Hospital Stay
Admission: RE | Admit: 2021-06-21 | Discharge: 2021-06-21 | Disposition: A | Discharge status: Home / Self Care | Payer: PPO | Source: Ambulatory Visit | Attending: Urology | Admitting: Urology

## 2021-06-21 ENCOUNTER — Other Ambulatory Visit: Payer: PPO

## 2021-06-21 ENCOUNTER — Telehealth: Payer: Self-pay

## 2021-06-21 ENCOUNTER — Other Ambulatory Visit: Payer: Self-pay

## 2021-06-21 ENCOUNTER — Ambulatory Visit: Payer: PPO | Admitting: Nurse Practitioner

## 2021-06-21 DIAGNOSIS — C61 Malignant neoplasm of prostate: Secondary | ICD-10-CM

## 2021-06-21 NOTE — Telephone Encounter (Signed)
Left message details for patient in reference to patient's 9:00am-06/21/21 telephone appointment and to complete nursing portion. No answer will attempt call again at scheduled time of 9:00am.

## 2021-06-21 NOTE — Progress Notes (Signed)
Patient states doing well. No symptoms reported at this time.  Meaningful use complete. I-PSS Score of 3 (mild).  Flomax 0.4mg  and urology follow-up scheduled for December, 2022 -per [facility].  Patient's 9:00am 06/21/21 telephone appointment now complete.

## 2021-06-21 NOTE — Progress Notes (Signed)
Radiation Oncology         [phone] ________________________________  Name: Raymond [name] MRN: [id number]  Date: 06/21/2021  DOB: 07-29-1951  Post Treatment Note  CC: [name], NP  Abbie Sons, MD  Diagnosis:    70 y.o. gentleman with Stage T1c adenocarcinoma of the prostate with Gleason score of 4+5, and PSA of 5.8.     Interval Since Last Radiation:  5 weeks  03/21/21 - 05/17/21 (concurrent with ADT started 01/20/21): 1. The prostate, seminal vesicles, and pelvic lymph nodes were initially treated to 45 Gy in 25 fractions of 1.8 Gy  2. The prostate only was boosted to 75 Gy with 15 additional fractions of 2.0 Gy    Narrative:  I spoke with the patient to conduct his routine scheduled 1 month follow up visit via telephone to spare the patient unnecessary potential exposure in the healthcare setting during the current COVID-19 pandemic.  The patient was notified in advance and gave permission to proceed with this visit format.  He tolerated radiation treatment relatively well with only minor urinary irritation and modest fatigue.  He reported nocturia x4, weak stream, increased frequency and urgency but denied gross hematuria, dysuria, or bowel issues. He did increase his Flomax to BID with improvement in his LUTS.                              On review of systems, the patient states that he is doing very well in general.  He reports gradual improvement in his LUTS and continues taking the Flomax daily as prescribed.  His only residual symptom at this point is a weaker flow of stream in the evenings but he feels like he empties his bladder well throughout the day and is almost back to his baseline regarding any frequency or urgency.  He denies any abdominal pain, nausea, vomiting, diarrhea or constipation.  He continues with some fatigue and occasional hot flashes but overall, continues to tolerate the ADT fairly well and is pleased with his progress to date.  ALLERGIES:   has No Known Allergies.  Meds: Current Outpatient Medications  Medication Sig Dispense Refill   aspirin 81 MG tablet Take 81 mg by mouth daily.     atorvastatin (LIPITOR) 40 MG tablet TAKE 1 TABLET BY MOUTH EVERYDAY AT BEDTIME 90 tablet 1   Blood Glucose Monitoring Suppl (FREESTYLE LITE) DEVI by Does not apply route.     Calcium Carb-Cholecalciferol (CALCIUM CARBONATE+VITAMIN D PO) Take by mouth.     enalapril (VASOTEC) 5 MG tablet TAKE 1 TABLET BY MOUTH EVERY DAY 90 tablet 1   glipiZIDE (GLUCOTROL) 5 MG tablet Take 0.5 tablets (2.5 mg total) by mouth daily before breakfast AND 1 tablet (5 mg total) daily before supper. 135 tablet 3   glucose blood test strip Freestyle Lite Test Strip: Use to check blood sugar 2-3 x weekly Dx: E11.9 300 each 3   Leuprolide Acetate (LUPRON IJ) Inject as directed.     metFORMIN (GLUCOPHAGE) 1000 MG tablet Take 1 tablet (1,000 mg total) by mouth 2 (two) times daily with a meal. 180 tablet 3   sildenafil (VIAGRA) 100 MG tablet 0.5-1 tab 1 hour prior to intercourse 10 tablet 0   tamsulosin (FLOMAX) 0.4 MG CAPS capsule Take 1 capsule (0.4 mg total) by mouth daily. 30 capsule 12   No current facility-administered medications for this visit.    Physical Findings:  vitals  were not taken for this visit.   /10 Unable to assess due to telephone follow up visit format.  Lab Findings: Lab Results  Component Value Date   WBC 3.8 12/21/2020   HGB 13.0 (L) 12/21/2020   HCT 40.1 12/21/2020   MCV 82.3 12/21/2020   PLT 214 12/21/2020     Radiographic Findings: No results found.  Impression/Plan: 1.  70 y.o. gentleman with Stage T1c adenocarcinoma of the prostate with Gleason score of 4+5, and PSA of 5.8.    He will continue to follow up with urology for ongoing PSA determinations and has an appointment scheduled for labs on 07/19/21 and then will see Dr. Alinda Money the following week when he will be due for his next Eligard injection. He understands what to expect  with regards to PSA monitoring going forward. I will look forward to following his response to treatment via correspondence with urology, and would be happy to continue to participate in his care if clinically indicated. I talked to the patient about what to expect in the future, including his risk for erectile dysfunction and rectal bleeding. I encouraged him to call or return to the office if he has any questions regarding his previous radiation or possible radiation side effects. He was comfortable with this plan and will follow up as needed.     Nicholos Johns, PA-C

## 2021-06-21 NOTE — Addendum Note (Signed)
Encounter addended by: Mollie Germany, LPN on: 73/66/8159 4:70 AM  Actions taken: Order Reconciliation Section accessed, Flowsheet accepted, Vitals modified, Medication List reviewed, Problem List reviewed, Allergies reviewed, Clinical Note Signed

## 2021-06-23 ENCOUNTER — Other Ambulatory Visit: Payer: Self-pay

## 2021-06-23 ENCOUNTER — Ambulatory Visit (INDEPENDENT_AMBULATORY_CARE_PROVIDER_SITE_OTHER): Payer: PPO | Admitting: Nurse Practitioner

## 2021-06-23 ENCOUNTER — Other Ambulatory Visit: Payer: PPO

## 2021-06-23 ENCOUNTER — Encounter: Payer: Self-pay | Admitting: Nurse Practitioner

## 2021-06-23 VITALS — BP 120/80 | HR 71 | Temp 96.9°F | Ht 73.0 in | Wt 263.2 lb

## 2021-06-23 DIAGNOSIS — Z23 Encounter for immunization: Secondary | ICD-10-CM | POA: Diagnosis not present

## 2021-06-23 DIAGNOSIS — I10 Essential (primary) hypertension: Secondary | ICD-10-CM | POA: Diagnosis not present

## 2021-06-23 DIAGNOSIS — E559 Vitamin D deficiency, unspecified: Secondary | ICD-10-CM

## 2021-06-23 DIAGNOSIS — K219 Gastro-esophageal reflux disease without esophagitis: Secondary | ICD-10-CM | POA: Diagnosis not present

## 2021-06-23 DIAGNOSIS — R600 Localized edema: Secondary | ICD-10-CM | POA: Diagnosis not present

## 2021-06-23 DIAGNOSIS — E785 Hyperlipidemia, unspecified: Secondary | ICD-10-CM | POA: Diagnosis not present

## 2021-06-23 DIAGNOSIS — C61 Malignant neoplasm of prostate: Secondary | ICD-10-CM | POA: Diagnosis not present

## 2021-06-23 DIAGNOSIS — G4733 Obstructive sleep apnea (adult) (pediatric): Secondary | ICD-10-CM | POA: Diagnosis not present

## 2021-06-23 DIAGNOSIS — E1142 Type 2 diabetes mellitus with diabetic polyneuropathy: Secondary | ICD-10-CM

## 2021-06-23 NOTE — Progress Notes (Signed)
  Careteam: Patient Care Team: Eubanks, Jessica K, NP as PCP - General (Nurse Practitioner) Hecker, Kathryn, MD as Consulting Physician (Ophthalmology)  PLACE OF SERVICE:  [facility]  Advanced Directive information Does Patient Have a Medical Advance Directive?: No, Would patient like information on creating a medical advance directive?: No - Patient declined  No Known Allergies  Chief Complaint  Patient presents with   Medical Management of Chronic Issues    6 month follow up.   Health Maintenance    Zoster,flu vaccine, hemoglobin A1c     HPI: Patient is a 70 y.o. male routine follow up   Prostate cancer- completed treatment in October. Still has some nocturia but improving. Side effects are minimal . Continues on flomax.  Continues to follow up with urologist- next appt in December. Will get lupron injection.   DM- does not check blood sugars at home. No episodes of hypoglycemia.   Htn- controlled on enalapril and dietary modifications.   Hyperlipidemia- controlled on lipitor.  Review of Systems:  Review of Systems  Constitutional:  Negative for chills, fever and weight loss.  HENT:  Negative for tinnitus.   Respiratory:  Negative for cough, sputum production and shortness of breath.   Cardiovascular:  Negative for chest pain, palpitations and leg swelling.  Gastrointestinal:  Negative for abdominal pain, constipation, diarrhea and heartburn.  Genitourinary:  Negative for dysuria, frequency and urgency.  Musculoskeletal:  Negative for back pain, falls, joint pain and myalgias.  Skin: Negative.   Neurological:  Negative for dizziness and headaches.  Psychiatric/Behavioral:  Negative for depression and memory loss. The patient does not have insomnia.    Past Medical History:  Diagnosis Date   Diabetes mellitus without complication (HCC)    Edema    GERD (gastroesophageal reflux disease)    Hyperlipidemia    Hypertension    Obesity, unspecified    Problems with  sexual function    Prostate cancer (HCC)    Sleep apnea    released per MD, no CPAP   Unspecified sleep apnea    Unspecified vitamin D deficiency    Past Surgical History:  Procedure Laterality Date   CATARACT EXTRACTION  1964   Dr.Freed    COLONOSCOPY     HERNIA REPAIR  1987   Right Side, Dr.Kaufman    POLYPECTOMY     PROSTATE BIOPSY     Social History:   reports that he has never smoked. He has never used smokeless tobacco. He reports that he does not drink alcohol and does not use drugs.  Family History  Problem Relation Age of Onset   Colon cancer Mother 80   Breast cancer Sister    Breast cancer Father    Ovarian cancer Sister    Esophageal cancer Neg Hx    Stomach cancer Neg Hx    Rectal cancer Neg Hx    Colon polyps Neg Hx     Medications: Patient's Medications  New Prescriptions   No medications on file  Previous Medications   ASPIRIN 81 MG TABLET    Take 81 mg by mouth daily.   ATORVASTATIN (LIPITOR) 40 MG TABLET    TAKE 1 TABLET BY MOUTH EVERYDAY AT BEDTIME   CALCIUM CARB-CHOLECALCIFEROL (CALCIUM CARBONATE+VITAMIN D PO)    Take by mouth.   ENALAPRIL (VASOTEC) 5 MG TABLET    TAKE 1 TABLET BY MOUTH EVERY DAY   GLIPIZIDE (GLUCOTROL) 5 MG TABLET    Take 0.5 tablets (2.5 mg total) by mouth daily   before breakfast AND 1 tablet (5 mg total) daily before supper.   LEUPROLIDE ACETATE (LUPRON IJ)    Inject as directed.   METFORMIN (GLUCOPHAGE) 1000 MG TABLET    Take 1 tablet (1,000 mg total) by mouth 2 (two) times daily with a meal.   SILDENAFIL (VIAGRA) 100 MG TABLET    0.5-1 tab 1 hour prior to intercourse   TAMSULOSIN (FLOMAX) 0.4 MG CAPS CAPSULE    Take 1 capsule (0.4 mg total) by mouth daily.  Modified Medications   No medications on file  Discontinued Medications   BLOOD GLUCOSE MONITORING SUPPL (FREESTYLE LITE) DEVI    by Does not apply route.   GLUCOSE BLOOD TEST STRIP    Freestyle Lite Test Strip: Use to check blood sugar 2-3 x weekly Dx: E11.9    Physical  Exam:  Vitals:   06/23/21 0931  BP: 120/80  Pulse: 71  Temp: (!) 96.9 F (36.1 C)  TempSrc: Temporal  SpO2: 97%  Weight: 263 lb 3.2 oz (119.4 kg)  Height: 6' 1" (1.854 m)   Body mass index is 34.73 kg/m. Wt Readings from Last 3 Encounters:  06/23/21 263 lb 3.2 oz (119.4 kg)  05/01/21 264 lb 9.6 oz (120 kg)  03/22/21 258 lb (117 kg)    Physical Exam Constitutional:      General: He is not in acute distress.    Appearance: He is well-developed. He is not diaphoretic.  HENT:     Head: Normocephalic and atraumatic.     Right Ear: External ear normal.     Left Ear: External ear normal.     Mouth/Throat:     Pharynx: No oropharyngeal exudate.  Eyes:     Conjunctiva/sclera: Conjunctivae normal.     Pupils: Pupils are equal, round, and reactive to light.  Cardiovascular:     Rate and Rhythm: Normal rate and regular rhythm.     Heart sounds: Normal heart sounds.  Pulmonary:     Effort: Pulmonary effort is normal.     Breath sounds: Normal breath sounds.  Abdominal:     General: Bowel sounds are normal.     Palpations: Abdomen is soft.  Musculoskeletal:        General: No tenderness.     Cervical back: Normal range of motion and neck supple.     Right lower leg: No edema.     Left lower leg: No edema.  Skin:    General: Skin is warm and dry.  Neurological:     Mental Status: He is alert and oriented to person, place, and time.    Labs reviewed: Basic Metabolic Panel: Recent Labs    06/27/20 1016 12/21/20 0922  NA 136 137  K 4.5 4.4  CL 105 105  CO2 22 22  GLUCOSE 112* 99  BUN 16 16  CREATININE 1.01 0.90  CALCIUM 9.3 9.1   Liver Function Tests: Recent Labs    06/27/20 1016 12/21/20 0922  AST 24 19  ALT 35 27  BILITOT 0.8 0.6  PROT 6.9 6.4   No results for input(s): LIPASE, AMYLASE in the last 8760 hours. No results for input(s): AMMONIA in the last 8760 hours. CBC: Recent Labs    12/21/20 0922  WBC 3.8  NEUTROABS 1,767  HGB 13.0*  HCT 40.1   MCV 82.3  PLT 214   Lipid Panel: Recent Labs    12/21/20 0922  CHOL 109  HDL 30*  LDLCALC 55  TRIG 159*  CHOLHDL 3.6  TSH: No results for input(s): TSH in the last 8760 hours. A1C: Lab Results  Component Value Date   HGBA1C 6.9 (H) 12/21/2020     Assessment/Plan 1. Bilateral edema of lower extremity Improved at this time. Continue with low sodium diet, compression hose/socks, elevate legs when sitting.   2. Type 2 diabetes mellitus with diabetic polyneuropathy, without long-term current use of insulin ([place]) -Encouraged dietary compliance, routine foot care/monitoring and to keep up with diabetic eye exams through ophthalmology  -keep medications as prescribed at this time.  - Hemoglobin A1c - CMP with eGFR([facility])  3. Hyperlipidemia, unspecified hyperlipidemia type -controlled on lipitor.   4. Essential hypertension Blood pressure well controlled Continue current medications Recheck metabolic panel - CMP with eGFR([facility]) - CBC with Differential/Platelet  5. OBSTRUCTIVE SLEEP APNEA -does not use CPAP, was taken off, wife does not report snoring.   6. Gastroesophageal reflux disease, unspecified whether esophagitis present Stable, managed with dietary modifications.   7. Malignant neoplasm of prostate ([place]) Continues radiation, continues on lupron injections per urology.   8. Vitamin D deficiency Continue on supplement.   9. Need for influenza vaccination - Flu Vaccine QUAD High Dose(Fluad)   Next appt: 6 months.  Carlos American. [place], [facility] [phone]

## 2021-06-24 LAB — CBC WITH DIFFERENTIAL/PLATELET
Absolute Monocytes: 398 cells/uL (ref 200–950)
Basophils Absolute: 20 cells/uL (ref 0–200)
Basophils Relative: 0.7 %
Eosinophils Absolute: 249 cells/uL (ref 15–500)
Eosinophils Relative: 8.9 %
HCT: 36.6 % — ABNORMAL LOW (ref 38.5–50.0)
Hemoglobin: 12.3 g/dL — ABNORMAL LOW (ref 13.2–17.1)
Lymphs Abs: 476 cells/uL — ABNORMAL LOW (ref 850–3900)
MCH: 28.7 pg (ref 27.0–33.0)
MCHC: 33.6 g/dL (ref 32.0–36.0)
MCV: 85.5 fL (ref 80.0–100.0)
MPV: 10.5 fL (ref 7.5–12.5)
Monocytes Relative: 14.2 %
Neutro Abs: 1658 cells/uL (ref 1500–7800)
Neutrophils Relative %: 59.2 %
Platelets: 178 10*3/uL (ref 140–400)
RBC: 4.28 10*6/uL (ref 4.20–5.80)
RDW: 14.5 % (ref 11.0–15.0)
Total Lymphocyte: 17 %
WBC: 2.8 10*3/uL — ABNORMAL LOW (ref 3.8–10.8)

## 2021-06-24 LAB — COMPLETE METABOLIC PANEL WITH GFR
AG Ratio: 1.7 (calc) (ref 1.0–2.5)
ALT: 36 U/L (ref 9–46)
AST: 28 U/L (ref 10–35)
Albumin: 4.2 g/dL (ref 3.6–5.1)
Alkaline phosphatase (APISO): 63 U/L (ref 35–144)
BUN: 18 mg/dL (ref 7–25)
CO2: 21 mmol/L (ref 20–32)
Calcium: 9.3 mg/dL (ref 8.6–10.3)
Chloride: 106 mmol/L (ref 98–110)
Creat: 0.89 mg/dL (ref 0.70–1.35)
Globulin: 2.5 g/dL (calc) (ref 1.9–3.7)
Glucose, Bld: 128 mg/dL — ABNORMAL HIGH (ref 65–99)
Potassium: 4.2 mmol/L (ref 3.5–5.3)
Sodium: 137 mmol/L (ref 135–146)
Total Bilirubin: 0.7 mg/dL (ref 0.2–1.2)
Total Protein: 6.7 g/dL (ref 6.1–8.1)
eGFR: 93 mL/min/{1.73_m2} (ref >=60)

## 2021-06-24 LAB — HEMOGLOBIN A1C
Hgb A1c MFr Bld: 7.4 % of total Hgb — ABNORMAL HIGH (ref <5.7)
Mean Plasma Glucose: 166 mg/dL
eAG (mmol/L): 9.2 mmol/L

## 2021-06-27 ENCOUNTER — Other Ambulatory Visit: Payer: Self-pay

## 2021-06-27 DIAGNOSIS — R7989 Other specified abnormal findings of blood chemistry: Secondary | ICD-10-CM

## 2021-06-27 DIAGNOSIS — E1142 Type 2 diabetes mellitus with diabetic polyneuropathy: Secondary | ICD-10-CM

## 2021-07-02 ENCOUNTER — Other Ambulatory Visit: Payer: Self-pay | Admitting: Nurse Practitioner

## 2021-07-06 ENCOUNTER — Telehealth: Payer: Self-pay | Admitting: *Deleted

## 2021-07-10 ENCOUNTER — Encounter: Payer: Self-pay | Admitting: *Deleted

## 2021-07-10 ENCOUNTER — Other Ambulatory Visit: Payer: Self-pay | Admitting: *Deleted

## 2021-07-10 ENCOUNTER — Inpatient Hospital Stay: Payer: PPO | Attending: Adult Health | Admitting: *Deleted

## 2021-07-10 ENCOUNTER — Other Ambulatory Visit: Payer: Self-pay

## 2021-07-10 DIAGNOSIS — C61 Malignant neoplasm of prostate: Secondary | ICD-10-CM

## 2021-07-10 NOTE — Progress Notes (Signed)
2 identifiers used for this visit for verification purposes only. SCP visit reviewed and completed. SDOH assessed and completed. Pt denies fatigue. Pt is sleeping better,only getting up twice a night to bathroom. Urine urgency has decreased and feels like he is emptying completely. Pt says, " I have experienced burning with urination on occasion but nothing that lasts". Bowel are normal except when he eats spicier things and then he sometimes experience frequent loose stools. He takes Imodium to relieve this problem. Last colonoscopy was was 07/2019. Due in 3-5 years. Last Lupron injection was 01/2021.Pt is up to date with vaccines. I talked with pt today about nutrition, genetic testing and bone density. I will f/u on these referrals.

## 2021-07-19 DIAGNOSIS — C61 Malignant neoplasm of prostate: Secondary | ICD-10-CM | POA: Diagnosis not present

## 2021-07-26 DIAGNOSIS — Z5111 Encounter for antineoplastic chemotherapy: Secondary | ICD-10-CM | POA: Diagnosis not present

## 2021-07-26 DIAGNOSIS — C61 Malignant neoplasm of prostate: Secondary | ICD-10-CM | POA: Diagnosis not present

## 2021-08-08 ENCOUNTER — Inpatient Hospital Stay: Payer: PPO

## 2021-08-08 ENCOUNTER — Inpatient Hospital Stay: Payer: PPO | Attending: Adult Health | Admitting: Genetic Counselor

## 2021-08-08 ENCOUNTER — Other Ambulatory Visit: Payer: Self-pay

## 2021-08-08 DIAGNOSIS — C61 Malignant neoplasm of prostate: Secondary | ICD-10-CM

## 2021-08-08 DIAGNOSIS — Z803 Family history of malignant neoplasm of breast: Secondary | ICD-10-CM

## 2021-08-08 DIAGNOSIS — Z853 Personal history of malignant neoplasm of breast: Secondary | ICD-10-CM | POA: Diagnosis not present

## 2021-08-08 DIAGNOSIS — Z8041 Family history of malignant neoplasm of ovary: Secondary | ICD-10-CM | POA: Diagnosis not present

## 2021-08-08 LAB — GENETIC SCREENING ORDER

## 2021-08-09 ENCOUNTER — Encounter: Payer: Self-pay | Admitting: Genetic Counselor

## 2021-08-09 NOTE — Progress Notes (Signed)
REFERRING PROVIDER: Raynelle Bring, MD [place], [place] [address]  PRIMARY PROVIDER:  [name], NP  PRIMARY REASON FOR VISIT:  1. Malignant neoplasm of prostate ([place])   2. Family history of breast cancer   3. Family history of ovarian cancer     HISTORY OF PRESENT ILLNESS:   Raymond [name], a 71 y.o. male, was seen for a [place] cancer genetics consultation at the request of Dr. Alinda Money due to a personal and family history of cancer.  Raymond [name] presents to clinic today to discuss the possibility of a hereditary predisposition to cancer, to discuss genetic testing, and to further clarify his future cancer risks, as well as potential cancer risks for family members.   In 2022, at the age of 82, Raymond [name] was diagnosed with high risk prostate cancer.   CANCER HISTORY:  Oncology History  Malignant neoplasm of prostate ([place])  10/12/2020 Cancer Staging   Staging form: Prostate, AJCC 8th Edition - Clinical stage from 10/12/2020: Stage IIIC (cT1c, cN0, cM0, PSA: 5.8, Grade Group: 5) - Signed by Freeman Caldron, PA-C on 01/05/2021 Histopathologic type: Adenocarcinoma, NOS Stage prefix: Initial diagnosis Prostate specific antigen (PSA) range: Less than 10 Gleason primary pattern: 4 Gleason secondary pattern: 5 Gleason score: 9 Histologic grading system: 5 grade system Number of biopsy cores examined: 12 Number of biopsy cores positive: 7 Location of positive needle core biopsies: Both sides    01/05/2021 Initial Diagnosis   Malignant neoplasm of prostate ([place])   03/21/2021 - 05/17/2021 Radiation Therapy   40 total treatments radiated to the prostate, seminal vesicles and pelvic lymph nodes   45 Gy in 25 fractions 75 Gy in 15 additional fractions *Boost*      Past Medical History:  Diagnosis Date   Diabetes mellitus without complication (HCC)    Edema    GERD (gastroesophageal reflux disease)    Hyperlipidemia    Hypertension    Obesity, unspecified     Problems with sexual function    Prostate cancer ([place])    Sleep apnea    released per MD, no CPAP   Unspecified sleep apnea    Unspecified vitamin D deficiency     Past Surgical History:  Procedure Laterality Date   CATARACT EXTRACTION  1964   Dr.Freed    COLONOSCOPY     HERNIA REPAIR  1987   Right Side, Dr.Kaufman    POLYPECTOMY     PROSTATE BIOPSY      Social History   Socioeconomic History   Marital status: Married    Spouse name: Not on file   Number of children: 0   Years of education: Not on file   Highest education level: Not on file  Occupational History   Not on file  Tobacco Use   Smoking status: Never   Smokeless tobacco: Never  Vaping Use   Vaping Use: Never used  Substance and Sexual Activity   Alcohol use: No   Drug use: No   Sexual activity: Yes  Other Topics Concern   Not on file  Social History Narrative   Not on file   Social Determinants of Health   Financial Resource Strain: Low Risk    Difficulty of Paying Living Expenses: Not hard at all  Food Insecurity: No Food Insecurity   Worried About Charity fundraiser in the Last Year: Never true   Ran Out of Food in the Last Year: Never true  Transportation Needs: No Transportation Needs  Lack of Transportation (Medical): No   Lack of Transportation (Non-Medical): No  Physical Activity: Insufficiently Active   Days of Exercise per Week: 4 days   Minutes of Exercise per Session: 30 min  Stress: No Stress Concern Present   Feeling of Stress : Not at all  Social Connections: Moderately Integrated   Frequency of Communication with Friends and Family: More than three times a week   Frequency of Social Gatherings with Friends and Family: Three times a week   Attends Religious Services: 1 to 4 times per year   Active Member of Clubs or Organizations: No   Attends Archivist Meetings: Never   Marital Status: Married     FAMILY HISTORY:  We obtained a detailed, 4-generation family  history.  Significant diagnoses are listed below: Family History  Problem Relation Age of Onset   Colon cancer Mother 7   Breast cancer Mother        dx. 13s   Lung cancer Father 29   Breast cancer Sister        dx. 51s   Ovarian cancer Sister        dx. 72s   Cancer Maternal Aunt        unknown type   Cancer Maternal Uncle        unknown type   Ovarian cancer Cousin    Breast cancer Cousin 74   Esophageal cancer Neg Hx    Stomach cancer Neg Hx    Rectal cancer Neg Hx    Colon polyps Neg Hx     Raymond [name] sister was diagnosed with ovarian cancer in her 50s, she died due to cancer metastasis. His second sister was diagnosed with breast cancer in her 82s and died at age 71. His mother was diagnosed with breast cancer in her 79s and had a mastectomy. She was later diagnosed with colon cancer at age 19, she died at 56. One maternal uncle and one maternal aunt were diagnosed with cancer (unknown types) at unknown ages but >46. They are both deceased. The aunt's daughter (Raymond [name] cousin) was diagnosed with ovarian cancer at an unknown age. His father was diagnosed with lung cancer at age 20, he smoked, he died at age 57. His paternal cousin was diagnosed with breast cancer at age 4.  Raymond [name] is unaware of previous family history of genetic testing for hereditary cancer risks. There is no reported Ashkenazi Jewish ancestry.  GENETIC COUNSELING ASSESSMENT: Mr. Stallbaumer is a 71 y.o. male with a personal and family history of cancer which is somewhat suggestive of a hereditary predisposition to cancer. We, therefore, discussed and recommended the following at today's visit.   DISCUSSION: We discussed that 5 - 10% of cancer is hereditary, with most cases of prostate cancer associated with BRCA1/2 and HOXB13.  There are other genes that can be associated with hereditary prostate cancer syndromes.  We discussed that testing is beneficial for several reasons including knowing how to follow  individuals after completing their treatment and understanding if other family members could be at risk for cancer and allowing them to undergo genetic testing.   We reviewed the characteristics, features and inheritance patterns of hereditary cancer syndromes. We also discussed genetic testing, including the appropriate family members to test, the process of testing, insurance coverage and turn-around-time for results. We discussed the implications of a negative, positive, carrier and/or variant of uncertain significant result. We recommended Mr. Mcclinton pursue genetic testing for a panel that includes  genes associated with prostate, breast, and ovarian cancer.   Mr. Swift  was offered a common hereditary cancer panel (47 genes) and an expanded pan-cancer panel (84 genes). Mr. Matney was informed of the benefits and limitations of each panel, including that expanded pan-cancer panels contain genes that do not have clear management guidelines at this point in time.  We also discussed that as the number of genes included on a panel increases, the chances of variants of uncertain significance increases.  After considering the benefits and limitations of each gene panel, Mr. Hagood elected to have [facility] Common Cancer Panel+ EGFR.   The Common Hereditary Cancers + RNA Panel offered by [facility] includes sequencing, deletion/duplication, and RNA testing of the following 47 genes: APC, ATM, AXIN2, BARD1, BMPR1A, BRCA1, BRCA2, BRIP1, CDH1, CDK4*, CDKN2A (p14ARF)*, CDKN2A (p16INK4a)*, CHEK2, CTNNA1, DICER1, EPCAM (Deletion/duplication testing only), GREM1 (promoter region deletion/duplication testing only), KIT, MEN1, MLH1, MSH2, MSH3, MSH6, MUTYH, NBN, NF1, NHTL1, PALB2, PDGFRA*, PMS2, POLD1, POLE, PTEN, RAD50, RAD51C, RAD51D, SDHB, SDHC, SDHD, SMAD4, SMARCA4. STK11, TP53, TSC1, TSC2, and VHL.  The following genes were evaluated for sequence changes only: SDHA and HOXB13 c.251G>A variant only.  RNA analysis is not  performed for the * genes.     Based on Mr. Caetano personal and family history of cancer, he meets medical criteria for genetic testing. Despite that he meets criteria, he may still have an out of pocket cost. We discussed that if his out of pocket cost for testing is over $100, the laboratory will call and confirm whether he wants to proceed with testing.  If the out of pocket cost of testing is less than $100 he will be billed by the genetic testing laboratory.   PLAN: After considering the risks, benefits, and limitations, Mr. Metzgar provided informed consent to pursue genetic testing and the blood sample was sent to [facility] for analysis of the Common Cancer Panel+EGFR. Results should be available within approximately 2-3 weeks' time, at which point they will be disclosed by telephone to Mr. Schlender, as will any additional recommendations warranted by these results. Mr. Furio will receive a summary of his genetic counseling visit and a copy of his results once available. This information will also be available in Epic.   Mr. Thrush questions were answered to his satisfaction today. Our contact information was provided should additional questions or concerns arise. Thank you for the referral and allowing [place] to share in the care of your patient.   Lucille Passy, MS, [facility] Genetic Counselor [email or web address] (P) [phone]  The patient was seen for a total of 40 minutes in face-to-face genetic counseling.  The patient brought his wife.  Drs. Lindi Adie and/or Burr Medico were available to discuss this case as needed.   _______________________________________________________________________ For Office Staff:  Number of people involved in session: 2 Was an Intern/ student involved with case: no

## 2021-08-22 ENCOUNTER — Other Ambulatory Visit: Payer: Self-pay

## 2021-08-22 ENCOUNTER — Encounter: Payer: Self-pay | Admitting: Genetic Counselor

## 2021-08-22 ENCOUNTER — Inpatient Hospital Stay (HOSPITAL_BASED_OUTPATIENT_CLINIC_OR_DEPARTMENT_OTHER): Payer: PPO | Admitting: Genetic Counselor

## 2021-08-22 DIAGNOSIS — Z1379 Encounter for other screening for genetic and chromosomal anomalies: Secondary | ICD-10-CM

## 2021-08-23 NOTE — Progress Notes (Signed)
HPI:   [name] was previously seen in the [facility] clinic due to a personal and family history of cancer and concerns regarding a hereditary predisposition to cancer. Please refer to our prior cancer genetics clinic note for more information regarding our discussion, assessment and recommendations, at the time. During the session today, we reviewed Raymond [name] genetic test results. These results and recommendations are discussed in more detail below.  CANCER HISTORY:  Oncology History  Malignant neoplasm of prostate ([place])  10/12/2020 Cancer Staging   Staging form: Prostate, AJCC 8th Edition - Clinical stage from 10/12/2020: Stage IIIC (cT1c, cN0, cM0, PSA: 5.8, Grade Group: 5) - Signed by Freeman Caldron, PA-C on 01/05/2021 Histopathologic type: Adenocarcinoma, NOS Stage prefix: Initial diagnosis Prostate specific antigen (PSA) range: Less than 10 Gleason primary pattern: 4 Gleason secondary pattern: 5 Gleason score: 9 Histologic grading system: 5 grade system Number of biopsy cores examined: 12 Number of biopsy cores positive: 7 Location of positive needle core biopsies: Both sides    01/05/2021 Initial Diagnosis   Malignant neoplasm of 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 Of note, there was a variant of uncertain significance in the EGFR gene (c.1978G>A) and POLE gene (c.926A>G). Report date is 08/17/2021.  The Common Hereditary Cancers + RNA Panel offered by [facility] includes sequencing, deletion/duplication, and RNA testing of the following 48 genes: APC, ATM, AXIN2, BARD1, BMPR1A, BRCA1, BRCA2, BRIP1, CDH1, CDK4*, CDKN2A (p14ARF)*, CDKN2A (p16INK4a)*, CHEK2, CTNNA1, DICER1, EGFR, EPCAM (Deletion/duplication testing only), GREM1  (promoter region deletion/duplication testing only), KIT, MEN1, MLH1, MSH2, MSH3, MSH6, MUTYH, NBN, NF1, NHTL1, PALB2, PDGFRA*, PMS2, POLD1, POLE, PTEN, RAD50, RAD51C, RAD51D, SDHB, SDHC, SDHD, SMAD4, SMARCA4. STK11, TP53, TSC1, TSC2, and VHL.  The following genes were evaluated for sequence changes only: SDHA and HOXB13 c.251G>A variant only.  RNA analysis is not performed for the * genes.       FAMILY HISTORY:  We obtained a detailed, 4-generation family history.  Significant diagnoses are listed below:      Family History  Problem Relation Age of Onset   Colon cancer Mother 12   Breast cancer Mother          dx. 10s   Lung cancer Father 34   Breast cancer Sister          dx. 71s   Ovarian cancer Sister          dx. 62s   Cancer Maternal Aunt          unknown type   Cancer Maternal Uncle          unknown type   Ovarian cancer Cousin     Breast cancer Cousin 74   Esophageal cancer Neg Hx     Stomach cancer Neg Hx     Rectal cancer Neg Hx     Colon polyps Neg Hx        Raymond [name] sister was diagnosed with ovarian cancer in her 25s, she died due to cancer metastasis. His second sister was diagnosed with breast cancer in her 66s and died at age 86. His mother was diagnosed with breast cancer in her 33s and had a mastectomy. She was later diagnosed with colon cancer at age 48,  she died at 52. One maternal uncle and one maternal aunt were diagnosed with cancer (unknown types) at unknown ages but >2. They are both deceased. The aunt's daughter (Raymond [name] cousin) was diagnosed with ovarian cancer at an unknown age. His father was diagnosed with lung cancer at age 22, he smoked, he died at age 25. His paternal cousin was diagnosed with breast cancer at age 74.   Raymond [name] is unaware of previous family history of genetic testing for hereditary cancer risks. There is no reported Ashkenazi Jewish ancestry.  GENETIC TEST RESULTS:  [facility] Common Cancer Panel+EGFR found no pathogenic  variants.   The Common Hereditary Cancers + RNA Panel offered by [facility] includes sequencing, deletion/duplication, and RNA testing of the following 48 genes: APC, ATM, AXIN2, BARD1, BMPR1A, BRCA1, BRCA2, BRIP1, CDH1, CDK4*, CDKN2A (p14ARF)*, CDKN2A (p16INK4a)*, CHEK2, CTNNA1, DICER1, EGFR, EPCAM (Deletion/duplication testing only), GREM1 (promoter region deletion/duplication testing only), KIT, MEN1, MLH1, MSH2, MSH3, MSH6, MUTYH, NBN, NF1, NHTL1, PALB2, PDGFRA*, PMS2, POLD1, POLE, PTEN, RAD50, RAD51C, RAD51D, SDHB, SDHC, SDHD, SMAD4, SMARCA4. STK11, TP53, TSC1, TSC2, and VHL. The following genes were evaluated for sequence changes only: SDHA and HOXB13 c.251G>A variant only. RNA analysis is not performed for the * genes.   The test report has been scanned into EPIC and is located under the Molecular Pathology section of the Results Review tab.  A portion of the result report is included below for reference. Report date is 08/17/2021.     Genetic testing identified a variant of uncertain significance (VUS) in the EGFR gene (c.1978G>A) and POLE gene (c.926A>G). At this time, it is unknown if the variants are associated with an increased risk for cancer or if they are benign, but most uncertain variants are reclassified to benign. It should not be used to make medical management decisions. With time, we suspect the laboratory will determine the significance of the variants, if any. If the laboratory reclassifies the variants, we will attempt to contact Mr. Doeden to discuss it further.   Even though a pathogenic variant was not identified, possible explanations for the cancer in the family may include: There may be no hereditary risk for cancer in the family. The cancers in Raymond [name] and/or his family may be due to other genetic or environmental factors. There may be a gene mutation in one of these genes that current testing methods cannot detect, but that chance is small. There could be another gene that  has not yet been discovered, or that we have not yet tested, that is responsible for the cancer diagnoses in the family.  It is also possible there is a hereditary cause for the cancer in the family that Mr. Upperman did not inherit.  Therefore, it is important to remain in touch with cancer genetics in the future so that we can continue to offer Mr. Gallaga the most up to date genetic testing.   ADDITIONAL GENETIC TESTING:  We discussed with Mr. Bilton that his genetic testing was fairly extensive.  If there are genes identified to increase cancer risk that can be analyzed in the future, we would be happy to discuss and coordinate this testing at that time.    CANCER SCREENING RECOMMENDATIONS:  Mr. Membreno test result is considered negative (normal).  This means that we have not identified a hereditary cause for his personal and family history of cancer at this time.   An individual's cancer risk and medical management are not determined by genetic test results alone. Overall  cancer risk assessment incorporates additional factors, including personal medical history, family history, and any available genetic information that may result in a personalized plan for cancer prevention and surveillance. Therefore, it is recommended he continue to follow the cancer management and screening guidelines provided by his oncology and primary healthcare provider.  RECOMMENDATIONS FOR FAMILY MEMBERS:   Other members of the family may still carry a pathogenic variant in one of these genes that Mr. Tilly did not inherit. Based on the family history, we recommend his nieces and nephews have genetic counseling and testing.  We do not recommend familial testing for the EGFR and POLE variants of uncertain significance (VUS).  FOLLOW-UP:  Cancer genetics is a rapidly advancing field and it is possible that new genetic tests will be appropriate for him and/or his family members in the future. We encouraged him to remain in  contact with cancer genetics on an annual basis so we can update his personal and family histories and let him know of advances in cancer genetics that may benefit this family.   Our contact number was provided. Mr. Ciresi questions were answered to his satisfaction, and he knows he is welcome to call us at anytime with additional questions or concerns.   Lucille Passy, MS, [facility] Genetic Counselor [email or web address] (P) [phone]  The patient was seen for a total of 10 minutes in face-to-face genetic counseling.  The patient brought his wife.  Drs. Lindi Adie and/or Burr Medico were available to discuss this case as needed.    _______________________________________________________________________ For Office Staff:  Number of people involved in session: 2 Was an Intern/ student involved with case: no

## 2021-09-02 ENCOUNTER — Other Ambulatory Visit: Payer: Self-pay | Admitting: Internal Medicine

## 2021-09-03 ENCOUNTER — Other Ambulatory Visit: Payer: Self-pay | Admitting: Urology

## 2021-09-07 ENCOUNTER — Other Ambulatory Visit: Payer: Self-pay | Admitting: Urology

## 2021-09-11 ENCOUNTER — Ambulatory Visit: Payer: PPO | Admitting: Registered"

## 2021-09-13 ENCOUNTER — Ambulatory Visit: Payer: PPO | Admitting: Registered"

## 2021-09-18 DIAGNOSIS — E119 Type 2 diabetes mellitus without complications: Secondary | ICD-10-CM | POA: Diagnosis not present

## 2021-09-18 DIAGNOSIS — H2701 Aphakia, right eye: Secondary | ICD-10-CM | POA: Diagnosis not present

## 2021-09-18 DIAGNOSIS — H40013 Open angle with borderline findings, low risk, bilateral: Secondary | ICD-10-CM | POA: Diagnosis not present

## 2021-09-18 DIAGNOSIS — H25812 Combined forms of age-related cataract, left eye: Secondary | ICD-10-CM | POA: Diagnosis not present

## 2021-09-18 DIAGNOSIS — H524 Presbyopia: Secondary | ICD-10-CM | POA: Diagnosis not present

## 2021-09-18 LAB — HM DIABETES EYE EXAM

## 2021-09-26 ENCOUNTER — Other Ambulatory Visit: Payer: Self-pay | Admitting: Internal Medicine

## 2021-09-27 ENCOUNTER — Other Ambulatory Visit: Payer: PPO

## 2021-09-27 ENCOUNTER — Other Ambulatory Visit: Payer: Self-pay

## 2021-09-27 ENCOUNTER — Encounter: Payer: Self-pay | Admitting: Internal Medicine

## 2021-09-27 DIAGNOSIS — R7989 Other specified abnormal findings of blood chemistry: Secondary | ICD-10-CM | POA: Diagnosis not present

## 2021-09-27 DIAGNOSIS — E1142 Type 2 diabetes mellitus with diabetic polyneuropathy: Secondary | ICD-10-CM | POA: Diagnosis not present

## 2021-09-28 DIAGNOSIS — L819 Disorder of pigmentation, unspecified: Secondary | ICD-10-CM | POA: Diagnosis not present

## 2021-09-28 DIAGNOSIS — L814 Other melanin hyperpigmentation: Secondary | ICD-10-CM | POA: Diagnosis not present

## 2021-09-28 DIAGNOSIS — L821 Other seborrheic keratosis: Secondary | ICD-10-CM | POA: Diagnosis not present

## 2021-09-28 DIAGNOSIS — L988 Other specified disorders of the skin and subcutaneous tissue: Secondary | ICD-10-CM | POA: Diagnosis not present

## 2021-09-28 DIAGNOSIS — D225 Melanocytic nevi of trunk: Secondary | ICD-10-CM | POA: Diagnosis not present

## 2021-09-28 DIAGNOSIS — B351 Tinea unguium: Secondary | ICD-10-CM | POA: Diagnosis not present

## 2021-09-28 LAB — CBC WITH DIFFERENTIAL/PLATELET
Absolute Monocytes: 456 cells/uL (ref 200–950)
Basophils Absolute: 9 cells/uL (ref 0–200)
Basophils Relative: 0.3 %
Eosinophils Absolute: 242 cells/uL (ref 15–500)
Eosinophils Relative: 7.8 %
HCT: 38.2 % — ABNORMAL LOW (ref 38.5–50.0)
Hemoglobin: 12.6 g/dL — ABNORMAL LOW (ref 13.2–17.1)
Lymphs Abs: 679 cells/uL — ABNORMAL LOW (ref 850–3900)
MCH: 27.8 pg (ref 27.0–33.0)
MCHC: 33 g/dL (ref 32.0–36.0)
MCV: 84.3 fL (ref 80.0–100.0)
MPV: 10.2 fL (ref 7.5–12.5)
Monocytes Relative: 14.7 %
Neutro Abs: 1714 cells/uL (ref 1500–7800)
Neutrophils Relative %: 55.3 %
Platelets: 179 10*3/uL (ref 140–400)
RBC: 4.53 10*6/uL (ref 4.20–5.80)
RDW: 13.3 % (ref 11.0–15.0)
Total Lymphocyte: 21.9 %
WBC: 3.1 10*3/uL — ABNORMAL LOW (ref 3.8–10.8)

## 2021-09-28 LAB — HEMOGLOBIN A1C
Hgb A1c MFr Bld: 7.5 % of total Hgb — ABNORMAL HIGH (ref <5.7)
Mean Plasma Glucose: 169 mg/dL
eAG (mmol/L): 9.3 mmol/L

## 2021-09-29 ENCOUNTER — Encounter: Payer: Self-pay | Admitting: Nurse Practitioner

## 2021-09-29 ENCOUNTER — Ambulatory Visit (INDEPENDENT_AMBULATORY_CARE_PROVIDER_SITE_OTHER): Payer: PPO | Admitting: Nurse Practitioner

## 2021-09-29 ENCOUNTER — Other Ambulatory Visit: Payer: Self-pay

## 2021-09-29 VITALS — BP 128/76 | HR 85 | Temp 97.6°F | Ht 73.0 in | Wt 266.6 lb

## 2021-09-29 DIAGNOSIS — C61 Malignant neoplasm of prostate: Secondary | ICD-10-CM

## 2021-09-29 DIAGNOSIS — E785 Hyperlipidemia, unspecified: Secondary | ICD-10-CM

## 2021-09-29 DIAGNOSIS — E1142 Type 2 diabetes mellitus with diabetic polyneuropathy: Secondary | ICD-10-CM | POA: Diagnosis not present

## 2021-09-29 DIAGNOSIS — I10 Essential (primary) hypertension: Secondary | ICD-10-CM | POA: Diagnosis not present

## 2021-09-29 MED ORDER — SITAGLIPTIN PHOSPHATE 100 MG PO TABS: 100.0000 mg | ORAL_TABLET | Freq: Every day | ORAL | 1 refills | Status: DC

## 2021-09-29 MED ORDER — FREESTYLE LIBRE 3 SENSOR MISC: 1.0000 | 3 refills | Status: DC

## 2021-09-29 MED ORDER — GLIPIZIDE 5 MG PO TABS: 2.5000 mg | ORAL_TABLET | Freq: Two times a day (BID) | ORAL | 1 refills | Status: DC

## 2021-09-29 NOTE — Patient Instructions (Addendum)
Increase physical activity Dietary modifications.   Decrease glizipide to half tablet twice daily with meals  ADD Januvia to current regimen

## 2021-09-29 NOTE — Progress Notes (Signed)
Careteam: Patient Care Team: [name], NP as PCP - General (Nurse Practitioner) [name], MD as Consulting Physician (Ophthalmology) Tyler Pita, MD as Consulting Physician (Radiation Oncology) Raynelle Bring, MD as Consulting Physician (Urology) Delice Bison, Charlestine Massed, NP as Nurse Practitioner (Hematology and Oncology) Harmon Pier, RN as Registered Nurse  PLACE OF SERVICE:  [place] Directive information Does Patient Have a Medical Advance Directive?: No, Does patient want to make changes to medical advance directive?: Yes (MAU/Ambulatory/Procedural Areas - Information given)  No Known Allergies  Chief Complaint  Patient presents with   Medical Management of Chronic Issues    3 month follow-up and discuss labs (copy printed). Discuss need for shingrix or post pone if patient refuses. Patient denies receiving any vaccines since last visit.       HPI: Patient is a 71 y.o. male for routine follow up  Prostate cancer- continues on lupron shots every 6 months. His PSA has significantly decreased.  Reports he is having hot flashes   Reports he has issues where blood sugars will drop as well. He will get really sweaty. Then energy level will drop. Question if it is related to blood sugar or side effect of lupron shot.  Sometimes he is at a point that he can not check blood sugar.  Reports episodes a few times a month.   He is eating 3 meals a day. Sometimes eats late. Weight has gone up. Drinking sweet tea.   Saw dermatologist for routine skin follow up.   He plans to discuss with his family about his living willing.   Did DNA test through genetic testing due to family hx of cancer, dad and mom and sister have all had cancer.    Review of Systems:  Review of Systems  Constitutional:  Negative for chills, fever and weight loss.  HENT:  Negative for tinnitus.   Respiratory:  Negative for cough, sputum production and shortness of  breath.   Cardiovascular:  Negative for chest pain, palpitations and leg swelling.  Gastrointestinal:  Negative for abdominal pain, constipation, diarrhea and heartburn.  Genitourinary:  Negative for dysuria, frequency and urgency.  Musculoskeletal:  Negative for back pain, falls, joint pain and myalgias.  Skin: Negative.   Neurological:  Negative for dizziness and headaches.  Psychiatric/Behavioral:  Negative for depression and memory loss. The patient does not have insomnia.    Past Medical History:  Diagnosis Date   Diabetes mellitus without complication (HCC)    Edema    GERD (gastroesophageal reflux disease)    Hyperlipidemia    Hypertension    Obesity, unspecified    Problems with sexual function    Prostate cancer ([place])    Sleep apnea    released per MD, no CPAP   Unspecified sleep apnea    Unspecified vitamin D deficiency    Past Surgical History:  Procedure Laterality Date   CATARACT EXTRACTION  1964   Dr.Freed    COLONOSCOPY     HERNIA REPAIR  1987   Right Side, Dr.Kaufman    POLYPECTOMY     PROSTATE BIOPSY     Social History:   reports that he has never smoked. He has never used smokeless tobacco. He reports that he does not drink alcohol and does not use drugs.  Family History  Problem Relation Age of Onset   Colon cancer Mother 15   Breast cancer Mother        dx. 29s   Lung cancer  Father 62   Breast cancer Sister        dx. 15s   Ovarian cancer Sister        dx. 42s   Cancer Maternal Aunt        unknown type   Cancer Maternal Uncle        unknown type   Ovarian cancer Cousin    Breast cancer Cousin 74   Esophageal cancer Neg Hx    Stomach cancer Neg Hx    Rectal cancer Neg Hx    Colon polyps Neg Hx     Medications: Patient's Medications  New Prescriptions   No medications on file  Previous Medications   ASPIRIN 81 MG TABLET    Take 81 mg by mouth daily.   ATORVASTATIN (LIPITOR) 40 MG TABLET    TAKE 1 TABLET BY MOUTH EVERYDAY AT BEDTIME    CALCIUM CARB-CHOLECALCIFEROL (CALCIUM CARBONATE+VITAMIN D PO)    Take by mouth.   CHOLECALCIFEROL (VITAMIN D3 PO)    Take 1,000 Units by mouth 2 (two) times daily.   ENALAPRIL (VASOTEC) 5 MG TABLET    TAKE 1 TABLET BY MOUTH EVERY DAY   GLIPIZIDE (GLUCOTROL) 5 MG TABLET    TAKE 1/2 TABLET BY MOUTH DAILY BEFORE BREAKFAST AND 1 TABLET DAILY BEFORE SUPPER.   LEUPROLIDE ACETATE (LUPRON IJ)    Inject as directed.   METFORMIN (GLUCOPHAGE) 1000 MG TABLET    Take 1 tablet (1,000 mg total) by mouth 2 (two) times daily with a meal.   SILDENAFIL (VIAGRA) 100 MG TABLET    0.5-1 tab 1 hour prior to intercourse   TAMSULOSIN (FLOMAX) 0.4 MG CAPS CAPSULE    Take 1 capsule (0.4 mg total) by mouth daily.  Modified Medications   No medications on file  Discontinued Medications   No medications on file    Physical Exam:  Vitals:   09/29/21 1038  BP: 128/76  Pulse: 85  Temp: 97.6 F (36.4 C)  TempSrc: Temporal  SpO2: 96%  Weight: 266 lb 9.6 oz (120.9 kg)  Height: 6\' 1"  (1.854 m)   Body mass index is 35.17 kg/m. Wt Readings from Last 3 Encounters:  09/29/21 266 lb 9.6 oz (120.9 kg)  06/23/21 263 lb 3.2 oz (119.4 kg)  05/01/21 264 lb 9.6 oz (120 kg)    Physical Exam Constitutional:      General: He is not in acute distress.    Appearance: He is well-developed. He is not diaphoretic.  HENT:     Head: Normocephalic and atraumatic.     Right Ear: External ear normal.     Left Ear: External ear normal.     Mouth/Throat:     Pharynx: No oropharyngeal exudate.  Eyes:     Conjunctiva/sclera: Conjunctivae normal.     Pupils: Pupils are equal, round, and reactive to light.  Cardiovascular:     Rate and Rhythm: Normal rate and regular rhythm.     Heart sounds: Normal heart sounds.  Pulmonary:     Effort: Pulmonary effort is normal.     Breath sounds: Normal breath sounds.  Abdominal:     General: Bowel sounds are normal.     Palpations: Abdomen is soft.  Musculoskeletal:        General: No  tenderness.     Cervical back: Normal range of motion and neck supple.     Right lower leg: No edema.     Left lower leg: No edema.  Skin:    General:  Skin is warm and dry.  Neurological:     Mental Status: He is alert and oriented to person, place, and time.  Psychiatric:        Mood and Affect: Mood normal.        Behavior: Behavior normal.    Labs reviewed: Basic Metabolic Panel: Recent Labs    12/21/20 0922 06/23/21 0954  NA 137 137  K 4.4 4.2  CL 105 106  CO2 22 21  GLUCOSE 99 128*  BUN 16 18  CREATININE 0.90 0.89  CALCIUM 9.1 9.3   Liver Function Tests: Recent Labs    12/21/20 0922 06/23/21 0954  AST 19 28  ALT 27 36  BILITOT 0.6 0.7  PROT 6.4 6.7   No results for input(s): LIPASE, AMYLASE in the last 8760 hours. No results for input(s): AMMONIA in the last 8760 hours. CBC: Recent Labs    12/21/20 0922 06/23/21 0954 09/27/21 0912  WBC 3.8 2.8* 3.1*  NEUTROABS 1,767 1,658 1,714  HGB 13.0* 12.3* 12.6*  HCT 40.1 36.6* 38.2*  MCV 82.3 85.5 84.3  PLT 214 178 179   Lipid Panel: Recent Labs    12/21/20 0922  CHOL 109  HDL 30*  LDLCALC 55  TRIG 159*  CHOLHDL 3.6   TSH: No results for input(s): TSH in the last 8760 hours. A1C: Lab Results  Component Value Date   HGBA1C 7.5 (H) 09/27/2021     Assessment/Plan 1. Type 2 diabetes mellitus with diabetic polyneuropathy, without long-term current use of insulin (HCC) -not controlled. Stressed importance of dietary compliance, routine foot care/monitoring and to keep up with diabetic eye exams through ophthalmology.  Will add januvia to current regimen and will decrease glipizide to 2.5 mg BID to avoid hypoglycemia  - sitaGLIPtin (JANUVIA) 100 MG tablet; Take 1 tablet (100 mg total) by mouth daily.  Dispense: 90 tablet; Refill: 1  2. Hyperlipidemia, unspecified hyperlipidemia type LDL at goal on may, will follow up lipids prior to next visit. Continue lipitor 40 mg daily with dietary  modifications  3. Essential hypertension -Blood pressure well controlled Continue current medications Recheck metabolic panel  4. Malignant neoplasm of prostate ([place]) -continues to follow up with urology, symptoms stable.   5. Morbid obesity ([place]) -education provided on healthy weight loss through increase in physical activity and proper nutrition    Return in about 3 months (around 12/27/2021) for routine follow up, labs prior to appt . Carlos American. [place], [facility] [phone]

## 2021-10-05 ENCOUNTER — Encounter: Payer: Self-pay | Admitting: Podiatry

## 2021-10-05 ENCOUNTER — Other Ambulatory Visit: Payer: Self-pay

## 2021-10-05 ENCOUNTER — Ambulatory Visit: Payer: PPO | Admitting: Podiatry

## 2021-10-05 DIAGNOSIS — M79674 Pain in right toe(s): Secondary | ICD-10-CM

## 2021-10-05 DIAGNOSIS — B351 Tinea unguium: Secondary | ICD-10-CM

## 2021-10-05 DIAGNOSIS — M79675 Pain in left toe(s): Secondary | ICD-10-CM | POA: Diagnosis not present

## 2021-10-05 DIAGNOSIS — E1165 Type 2 diabetes mellitus with hyperglycemia: Secondary | ICD-10-CM

## 2021-10-05 NOTE — Progress Notes (Signed)
This patient returns to my office for at risk foot care.  This patient requires this care by a professional since this patient will be at risk due to having diabetes.  This patient is unable to cut nails himself since the patient cannot reach his nails.These nails are painful walking and wearing shoes.  This patient presents for at risk foot care today. ? ?General Appearance  Alert, conversant and in no acute stress. ? ?Vascular  Dorsalis pedis and posterior tibial  pulses are palpable  bilaterally.  Capillary return is within normal limits  bilaterally. Temperature is within normal limits  bilaterally. ? ?Neurologic  Senn-Weinstein monofilament wire test within normal limits  bilaterally. Muscle power within normal limits bilaterally. ? ?Nails Thick disfigured discolored nails with subungual debris  from hallux to fifth toes bilaterally. No evidence of bacterial infection or drainage bilaterally. ? ?Orthopedic  No limitations of motion  feet .  No crepitus or effusions noted.  No bony pathology or digital deformities noted. Plantarflexed fifth met left foot.  ? ?Skin  normotropic skin with no porokeratosis noted bilaterally.  No signs of infections or ulcers noted.   Asymptomatic porokeratosis. ? ?Onychomycosis  Pain in right toes  Pain in left toes ? ?Consent was obtained for treatment procedures.   Mechanical debridement of nails 1-5  bilaterally performed with a nail nipper.  Filed with dremel without incident.  ? ? ?Return office visit    4   months                 Told patient to return for periodic foot care and evaluation due to potential at risk complications. ? ? ?Gardiner Barefoot DPM  ?

## 2021-10-09 ENCOUNTER — Telehealth: Payer: Self-pay | Admitting: *Deleted

## 2021-10-09 NOTE — Telephone Encounter (Signed)
Patient called and stated that he has gotten 2 Low Readings on his Sensor. Stated that one reading was 69 on the sensor and he checked it with his regular monitor it was 82.  ? ?Patient is currently taking Metformin twice daily, Januvia 1/2 am, and Glipizide 1/2 am and 1/2 pm.  ? ?Stated that his low readings usually occurs between noon and 6pm.  ? ?Patient is wondering if his medications need to be adjusted.  ? ?Please Advise.  ?

## 2021-10-10 NOTE — Telephone Encounter (Signed)
Patient Called and stated that we can speak with Blanch Media, wife,  if he is not available.  ? ?This morning he left the Glipizide off today.  ?Blood sugar this morning when he first woke up was 116. ?

## 2021-10-10 NOTE — Telephone Encounter (Signed)
Patient notified and agreed.  

## 2021-10-10 NOTE — Telephone Encounter (Signed)
Let have him cut back glipizide to half tablet with evening meal only. Continue all other medication and continue monitoring blood sugar ?

## 2021-10-14 ENCOUNTER — Other Ambulatory Visit: Payer: Self-pay | Admitting: Urology

## 2021-10-20 ENCOUNTER — Encounter: Payer: PPO | Attending: Nurse Practitioner | Admitting: Registered"

## 2021-10-20 ENCOUNTER — Other Ambulatory Visit: Payer: Self-pay

## 2021-10-20 DIAGNOSIS — E1165 Type 2 diabetes mellitus with hyperglycemia: Secondary | ICD-10-CM | POA: Insufficient documentation

## 2021-10-20 NOTE — Patient Instructions (Addendum)
Use Libre app notes to help to identify how your blood sugar is being affected by food and exercise. ? ?Consider not adding sugar to things like your coffee. ? ?Look for other protein snacks to keep in your car to avoid eating high carb snacks.  ? ?Continue with your plan to have structured early dinner time, getting exercise, ?Tracking food in lose-it app. ? ?Continue with eating non-starchy vegetables and work toward getting them daily. ?

## 2021-10-20 NOTE — Progress Notes (Signed)
Diabetes Self-Management Education ? ?Visit Type: Follow-up ? ?Appt. Start Time: 1055 Appt. End Time: 1140 ? ?3/172022 ? ?[name], identified by name and date of birth, is a 71 y.o. male with a diagnosis of Diabetes:  Type 2.  ? ?ASSESSMENT ? ?A1c 7.5% 09/27/21 ?Metformin 1000 mg/dL bid; glipizide 2.5 mg/dL 1x/day; Januvia ? ?Pt states he has completed 40 days of radiation treatment, on lupron and recently had good PSA result. ? ?Pt states he and his wife started exercise regimen, but not much exercise during holidays and indulged some on holiday foods. Pt states when they exercise it is usually in the morning ? ?Pt states he is eating vegetables ~3x/week. 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 ? ?Pt states his PCP started a Libre 3 sensor. GMI looks like is A1c should be improved, below 7% when he gets updated labs. ? ?Pt reports only intake prior to visit was coffee with half and half and sugar, libre showed a spike at 193 mg/dL, during appointment reading steadily declined. ? ? ? Diabetes Self-Management Education - 10/22/21 1204   ? ?  ? Visit Information  ? Visit Type Follow-up   ?  ? Complications  ? Last HgB A1C per patient/outside source 7.5 %   ? How often do you check your blood sugar? > 4 times/day   CGM  ?  ? Exercise  ? Exercise Type Light (walking / raking leaves)   ? How many days per week to you exercise? 1   ? How many minutes per day do you exercise? 20   ? Total minutes per week of exercise 20   ?  ? Patient Education  ? Nutrition management  Role of diet in the treatment of diabetes and the relationship between the three main macronutrients and blood glucose level   ? Physical activity and exercise  Role of exercise on diabetes management, blood pressure control and cardiac health.   ? Monitoring Other (comment)   CGM features  ?  ? Individualized Goals (developed by patient)  ?  Nutrition Other (comment)   avoid simple sugars, increase vegetable intake  ? Physical Activity Exercise 3-5 times per week   ? Monitoring  Other (comment)   ?  ? Outcomes  ? Expected Outcomes Demonstrated interest in learning. Expect positive outcomes   ? Future DMSE 6 months   ? Program Status Not Completed   ?  ? Subsequent Visit  ? Since your last visit have you continued or begun to take your medications as prescribed? Yes   ? ?  ?  ? ?  ? ? ?Individualized Plan for Diabetes Self-Management Training:  ? ?Learning Objective:  Patient will have a greater understanding of diabetes self-management. ?Patient education plan is to attend individual and/or group sessions per assessed needs and concerns. ? ? ?Patient Instructions  ?Use Libre app notes to help to identify how your blood sugar is being affected by food and exercise. ? ?Consider not adding sugar to things like your coffee. ? ?Look for other protein snacks to keep in your car to avoid eating high carb snacks.  ? ?Continue with your plan to have structured early dinner time, getting exercise, ?Tracking food in lose-it app. ? ?Continue with eating non-starchy vegetables and work toward getting them daily. ? ? ? ?Expected Outcomes:  Demonstrated interest in learning. Expect positive outcomes ? ?  Education material provided: none ? ?If problems or questions, patient to contact team via:  Phone and MyChart ? ?Future DSME appointment: 6 months ?

## 2021-10-22 ENCOUNTER — Encounter: Payer: Self-pay | Admitting: Registered"

## 2021-10-27 IMAGING — MR MR LUMBAR SPINE WO/W CM
7 of 10 series · 26 of 48 positions shown · IV contrast (gadavist)
Comparison: Nuclear medicine bone scan 11/02/2020

CLINICAL DATA: History of prostate cancer. Indeterminate nuclear
medicine bone scan 11/02/2020

EXAM:
MRI LUMBAR SPINE WITHOUT AND WITH CONTRAST
TECHNIQUE: Multiplanar and multiecho pulse sequences of the lumbar spine were
obtained without and with intravenous contrast.
CONTRAST:  10mL GADAVIST GADOBUTROL 1 MMOL/ML IV SOLN

[Series 5: T2 · sagittal · 4.0mm · 0.91mm/px · 3 of 17 slices shown (1 of 2)]
[im 1/17]
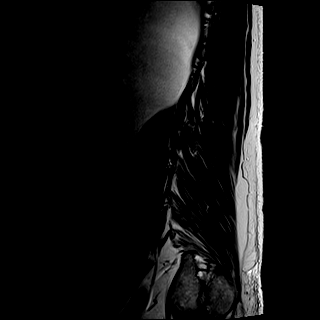
[im 9/17]
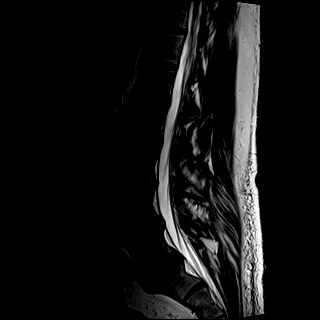
[im 17/17]
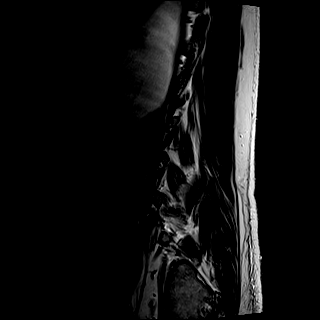

[Series 6: T1 · sagittal · 4.0mm · 0.91mm/px · 3 of 17 slices shown (1 of 2)]
[im 1/17]
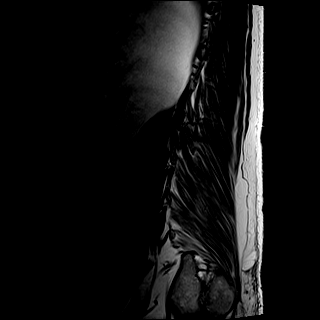
[im 9/17]
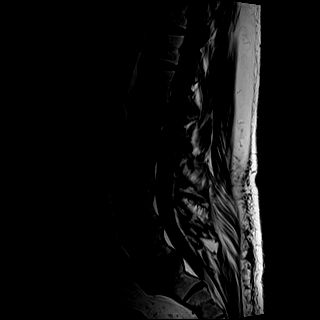
[im 17/17]
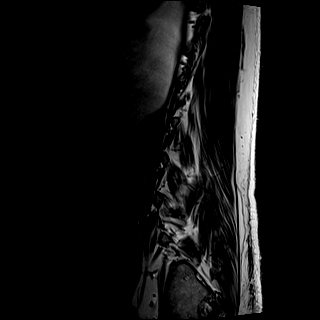

[Series 7: STIR · sagittal · 4.0mm · 0.45mm/px · 3 of 17 slices shown]
[im 1/17]
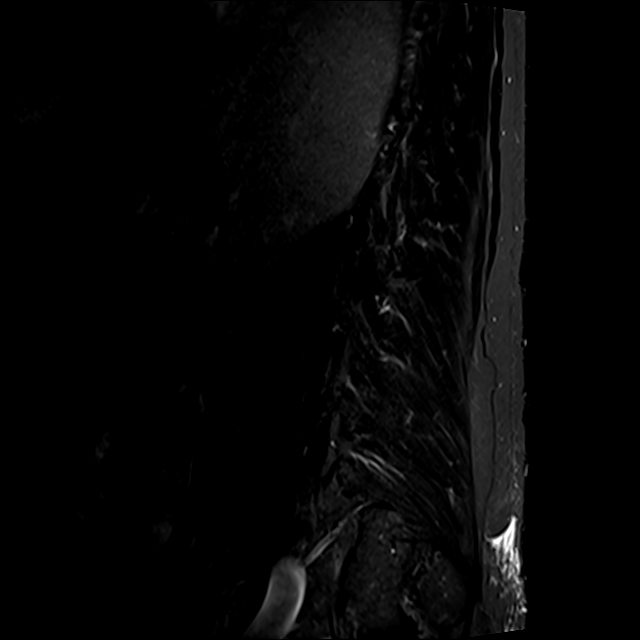
[im 9/17]
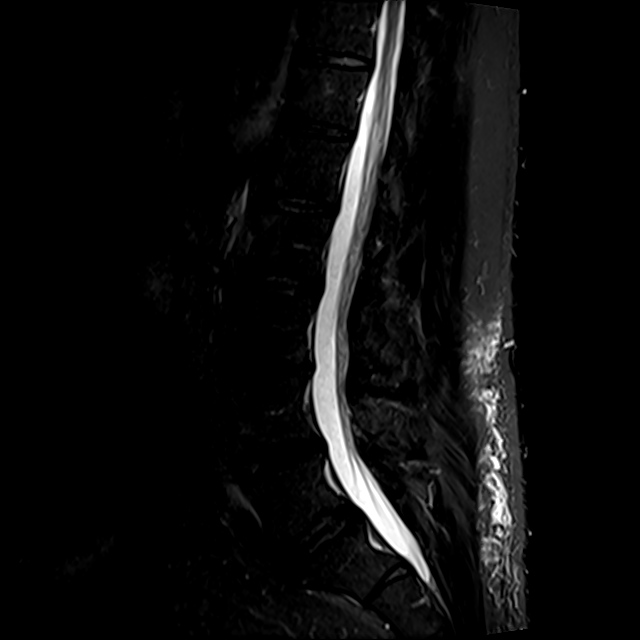
[im 17/17]
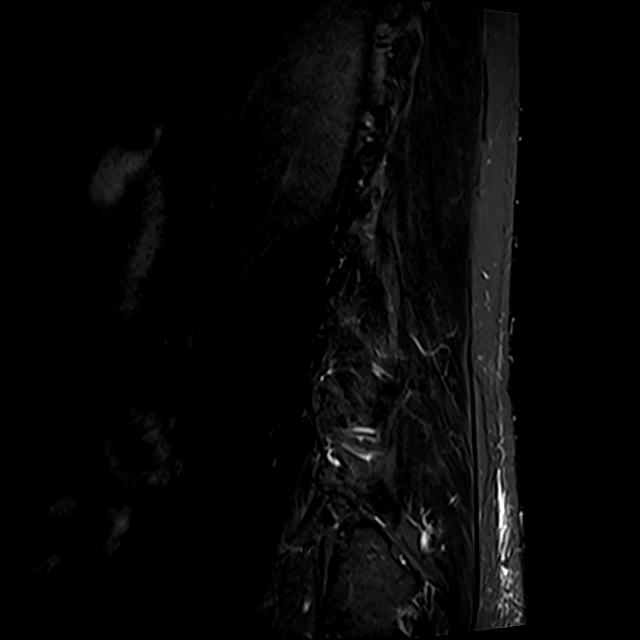

[Series 8: T2 · axial · 4.0mm · 0.78mm/px · z∈[-75,+163]mm · 6 of 40 slices shown (2 of 2)]
[im 1/40]
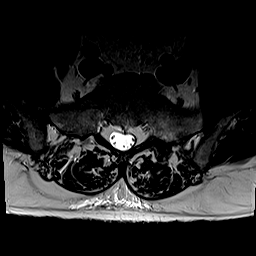
[im 8/40]
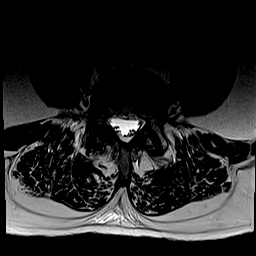
[im 16/40]
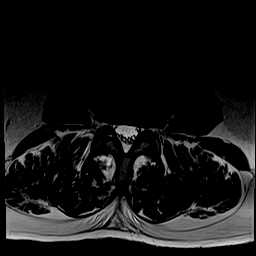
[im 24/40]
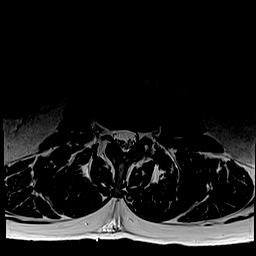
[im 32/40]
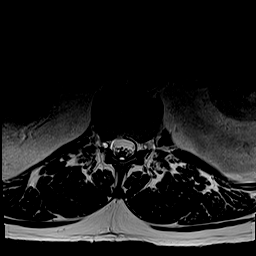
[im 40/40]
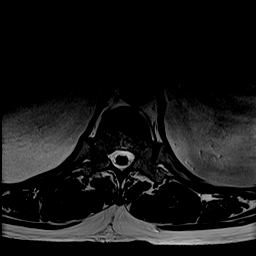

[Series 9: T1 · axial · 4.0mm · 0.39mm/px · z∈[-75,+163]mm · 6 of 40 slices shown (2 of 2)]
[im 1/40]
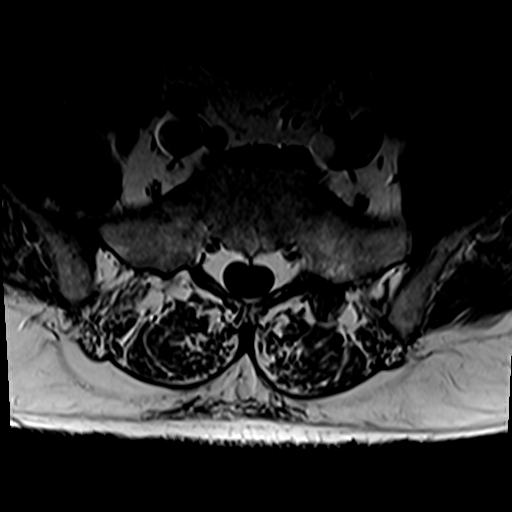
[im 8/40]
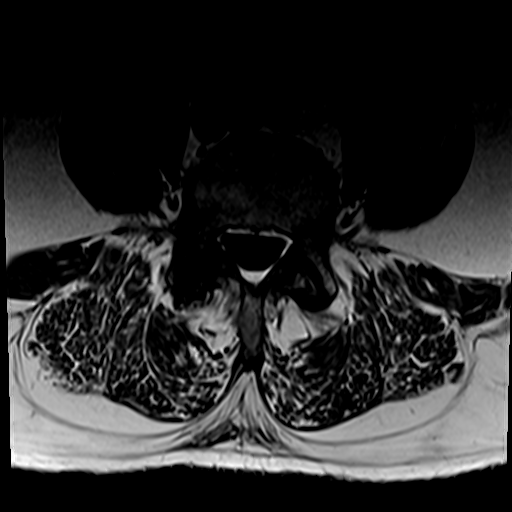
[im 16/40]
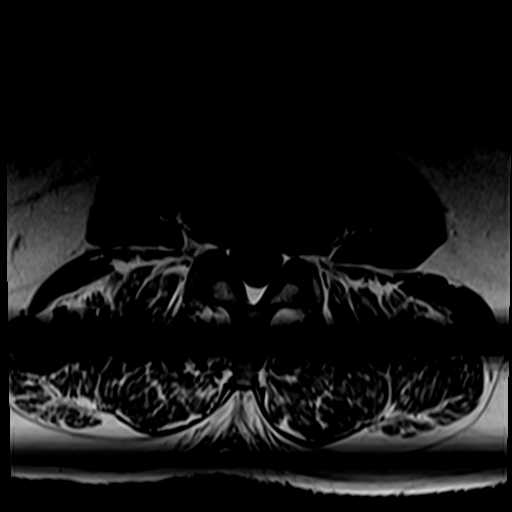
[im 24/40]
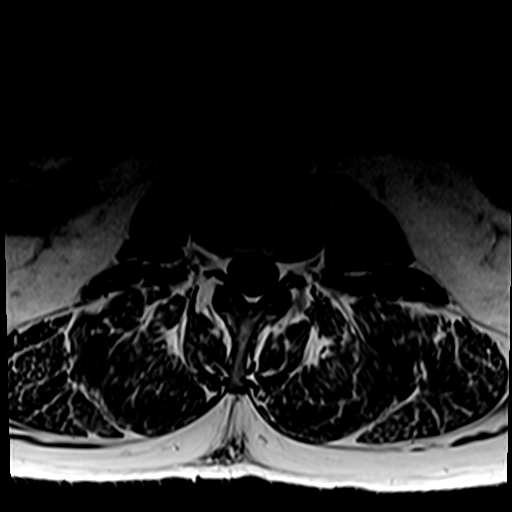
[im 32/40]
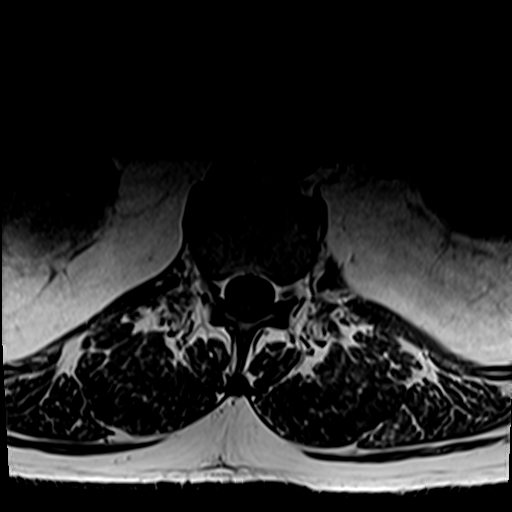
[im 40/40]
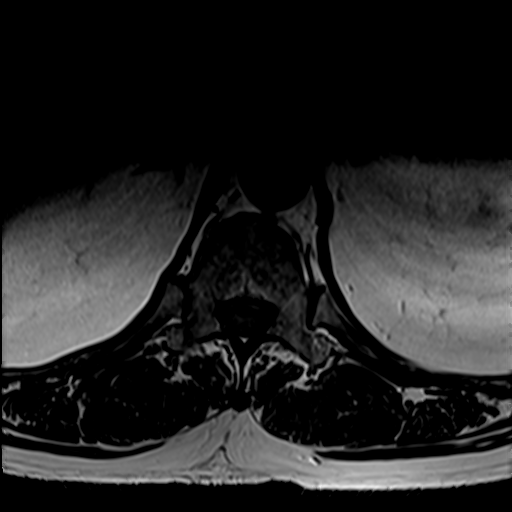

[Series 10: T1 fat-sat post-contrast · sagittal · 4.0mm · 0.91mm/px · 3 of 17 slices shown]
[im 1/17]
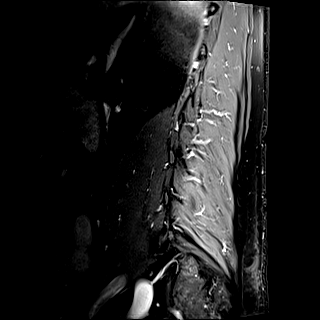
[im 9/17]
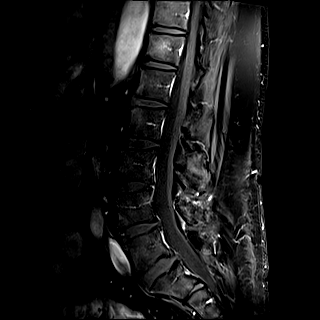
[im 17/17]
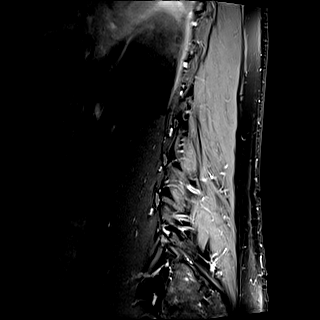

[Series 11: T1 post-contrast · axial · 4.0mm · 0.39mm/px · z∈[-75,-36]mm · 2 of 40 slices shown]
[im 1/40]
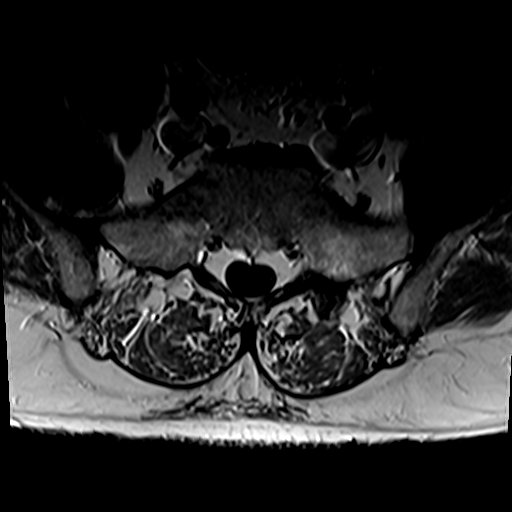
[im 8/40]
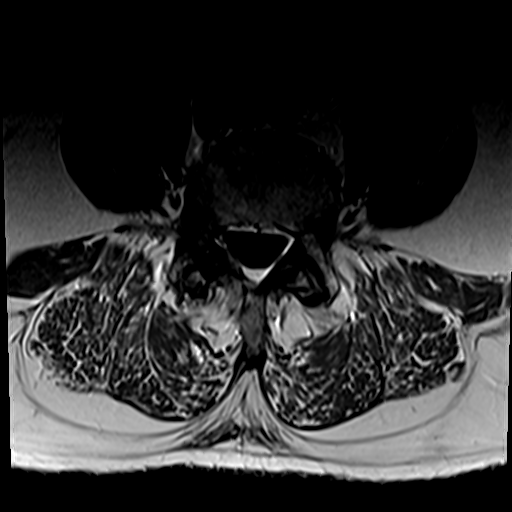

[26 of 48 positions shown; findings below may reference images not displayed]

FINDINGS: Segmentation:  Standard.

Alignment:  Physiologic.

Vertebrae: No fracture, evidence of discitis, or aggressive bone
lesion.

Conus medullaris and cauda equina: Conus extends to the L1 level.
Conus and cauda equina appear normal.

Paraspinal and other soft tissues: No acute paraspinal abnormality.
Right renal cyst.

Disc levels:

Disc spaces: Degenerative disease with disc desiccation throughout
the lumbar spine.

T12-L1: No significant disc bulge. No evidence of neural foraminal
stenosis. No central canal stenosis.

L1-L2: Minimal broad-based disc bulge. Mild bilateral facet
arthropathy. No evidence of neural foraminal stenosis. No central
canal stenosis.

L2-L3: Mild broad-based disc bulge. Mild bilateral facet
arthropathy. No evidence of neural foraminal stenosis. No central
canal stenosis.

L3-L4: Mild broad-based disc bulge. Mild bilateral facet
arthropathy. No evidence of neural foraminal stenosis. No central
canal stenosis.

L4-L5: Mild broad-based disc bulge. Severe bilateral facet
arthropathy with adjacent reactive bone marrow edema. Mild left
foraminal stenosis. No right foraminal stenosis. No central canal
stenosis.

L5-S1: Broad-based disc bulge. Severe bilateral facet arthropathy,
left greater than right. No evidence of neural foraminal stenosis.
No central canal stenosis.
IMPRESSION: 1. No aggressive osseous lesion to suggest metastatic disease of the
lumbar spine.
2. Lumbar spine spondylosis as described above most severe at L4-5
and L5-S1 accounting for the findings on the nuclear medicine bone
scan.

## 2021-11-01 ENCOUNTER — Telehealth: Payer: Self-pay

## 2021-11-01 NOTE — Telephone Encounter (Signed)
I called and left message for patient to reschedule appointment for Sept 14th with Sherrie Mustache, NP. ?

## 2021-11-01 NOTE — Telephone Encounter (Signed)
Patient called back and appointment reschduled. ?

## 2021-11-23 DIAGNOSIS — N400 Enlarged prostate without lower urinary tract symptoms: Secondary | ICD-10-CM | POA: Diagnosis not present

## 2021-11-23 DIAGNOSIS — E1165 Type 2 diabetes mellitus with hyperglycemia: Secondary | ICD-10-CM | POA: Diagnosis not present

## 2021-11-23 DIAGNOSIS — E1169 Type 2 diabetes mellitus with other specified complication: Secondary | ICD-10-CM | POA: Diagnosis not present

## 2021-11-23 DIAGNOSIS — E559 Vitamin D deficiency, unspecified: Secondary | ICD-10-CM | POA: Diagnosis not present

## 2021-11-23 DIAGNOSIS — I10 Essential (primary) hypertension: Secondary | ICD-10-CM | POA: Diagnosis not present

## 2021-11-23 DIAGNOSIS — Z9849 Cataract extraction status, unspecified eye: Secondary | ICD-10-CM | POA: Diagnosis not present

## 2021-11-23 DIAGNOSIS — E785 Hyperlipidemia, unspecified: Secondary | ICD-10-CM | POA: Diagnosis not present

## 2021-11-23 DIAGNOSIS — Z7982 Long term (current) use of aspirin: Secondary | ICD-10-CM | POA: Diagnosis not present

## 2021-11-23 DIAGNOSIS — E669 Obesity, unspecified: Secondary | ICD-10-CM | POA: Diagnosis not present

## 2021-11-23 DIAGNOSIS — K219 Gastro-esophageal reflux disease without esophagitis: Secondary | ICD-10-CM | POA: Diagnosis not present

## 2021-11-23 DIAGNOSIS — M4699 Unspecified inflammatory spondylopathy, multiple sites in spine: Secondary | ICD-10-CM | POA: Diagnosis not present

## 2021-11-23 DIAGNOSIS — C61 Malignant neoplasm of prostate: Secondary | ICD-10-CM | POA: Diagnosis not present

## 2021-11-30 ENCOUNTER — Other Ambulatory Visit: Payer: Self-pay | Admitting: Internal Medicine

## 2021-12-25 ENCOUNTER — Ambulatory Visit (INDEPENDENT_AMBULATORY_CARE_PROVIDER_SITE_OTHER): Payer: PPO | Admitting: Nurse Practitioner

## 2021-12-25 ENCOUNTER — Encounter: Payer: Self-pay | Admitting: Nurse Practitioner

## 2021-12-25 VITALS — BP 128/80 | HR 57 | Temp 97.9°F | Ht 73.0 in | Wt 257.0 lb

## 2021-12-25 DIAGNOSIS — I10 Essential (primary) hypertension: Secondary | ICD-10-CM | POA: Diagnosis not present

## 2021-12-25 DIAGNOSIS — E1142 Type 2 diabetes mellitus with diabetic polyneuropathy: Secondary | ICD-10-CM | POA: Diagnosis not present

## 2021-12-25 DIAGNOSIS — C61 Malignant neoplasm of prostate: Secondary | ICD-10-CM

## 2021-12-25 DIAGNOSIS — E559 Vitamin D deficiency, unspecified: Secondary | ICD-10-CM

## 2021-12-25 DIAGNOSIS — E785 Hyperlipidemia, unspecified: Secondary | ICD-10-CM | POA: Diagnosis not present

## 2021-12-25 NOTE — Progress Notes (Unsigned)
Careteam: Patient Care Team: [name], NP as PCP - General (Nurse Practitioner) [name], MD as Consulting Physician (Ophthalmology) Tyler Pita, MD as Consulting Physician (Radiation Oncology) Raynelle Bring, MD as Consulting Physician (Urology) Delice Bison, Charlestine Massed, NP as Nurse Practitioner (Hematology and Oncology) Harmon Pier, RN as Registered Nurse  PLACE OF SERVICE:  [place] Directive information Does Patient Have a Medical Advance Directive?: No, Would patient like information on creating a medical advance directive?: Yes (MAU/Ambulatory/Procedural Areas - Information given) (Paperwork given at previous visit)  No Known Allergies  Chief Complaint  Patient presents with   Medical Management of Chronic Issues    6 month follow-up. Dose changes made for metformin and glipizide due to low blood sugar episodes. Discuss need for shingrix, patient plans to get a local pharmacy. Not fasting today, has pending appointment for tomorrow.      HPI: Patient is a 71 y.o. male for routine follow up.  He has lab appt tomorrow At last appt decreased glipizide to 2.5 to evening meal and then in the morning takes [place] and metformin.  No significant hypoglycemia- will drop sometimes in the evening.  He is only taking metformin once daily- Rx for twice daily but at some point decreased to once daily   Continues to follow up with urology- continues to follow prostate cancer but PSA trending down.    Mood has been stable.   Review of Systems:  Review of Systems  Constitutional:  Negative for chills, fever and weight loss.  HENT:  Negative for tinnitus.   Respiratory:  Negative for cough, sputum production and shortness of breath.   Cardiovascular:  Negative for chest pain, palpitations and leg swelling.  Gastrointestinal:  Negative for abdominal pain, constipation, diarrhea and heartburn.  Genitourinary:  Negative for dysuria, frequency and  urgency.  Musculoskeletal:  Negative for back pain, falls, joint pain and myalgias.  Skin: Negative.   Neurological:  Negative for dizziness and headaches.  Psychiatric/Behavioral:  Negative for depression and memory loss. The patient does not have insomnia.    Past Medical History:  Diagnosis Date   Diabetes mellitus without complication (HCC)    Edema    GERD (gastroesophageal reflux disease)    Hyperlipidemia    Hypertension    Obesity, unspecified    Problems with sexual function    Prostate cancer ([place])    Sleep apnea    released per MD, no CPAP   Unspecified sleep apnea    Unspecified vitamin D deficiency    Past Surgical History:  Procedure Laterality Date   CATARACT EXTRACTION  1964   Dr.Freed    COLONOSCOPY     HERNIA REPAIR  1987   Right Side, Dr.Kaufman    POLYPECTOMY     PROSTATE BIOPSY     Social History:   reports that he has never smoked. He has never used smokeless tobacco. He reports that he does not drink alcohol and does not use drugs.  Family History  Problem Relation Age of Onset   Colon cancer Mother 76   Breast cancer Mother        dx. 75s   Lung cancer Father 64   Breast cancer Sister        dx. 16s   Ovarian cancer Sister        dx. 37s   Cancer Maternal Aunt        unknown type   Cancer Maternal Uncle  unknown type   Ovarian cancer Cousin    Breast cancer Cousin 74   Esophageal cancer Neg Hx    Stomach cancer Neg Hx    Rectal cancer Neg Hx    Colon polyps Neg Hx     Medications: Patient's Medications  New Prescriptions   No medications on file  Previous Medications   ASPIRIN 81 MG TABLET    Take 81 mg by mouth daily.   ATORVASTATIN (LIPITOR) 40 MG TABLET    TAKE 1 TABLET BY MOUTH EVERYDAY AT BEDTIME   CALCIUM CARB-CHOLECALCIFEROL (CALCIUM CARBONATE+VITAMIN D PO)    Take by mouth.   CHOLECALCIFEROL (VITAMIN D3 PO)    Take 1,000 Units by mouth 2 (two) times daily.   CONTINUOUS BLOOD GLUC SENSOR (FREESTYLE LIBRE 3 SENSOR)  MISC    1 Device by Does not apply route every 14 (fourteen) days. Place 1 sensor on the skin every 14 days. Use to check glucose continuously   ENALAPRIL (VASOTEC) 5 MG TABLET    TAKE 1 TABLET BY MOUTH EVERY DAY   GLIPIZIDE (GLUCOTROL) 5 MG TABLET    Take 2.5 mg by mouth daily.   LEUPROLIDE ACETATE (LUPRON IJ)    Inject as directed.   METFORMIN (GLUCOPHAGE) 1000 MG TABLET    Take 1,000 mg by mouth daily.   SILDENAFIL (VIAGRA) 100 MG TABLET    0.5-1 tab 1 hour prior to intercourse   SITAGLIPTIN (JANUVIA) 100 MG TABLET    Take 1 tablet (100 mg total) by mouth daily.   TAMSULOSIN (FLOMAX) 0.4 MG CAPS CAPSULE    Take 1 capsule (0.4 mg total) by mouth daily.  Modified Medications   No medications on file  Discontinued Medications   GLIPIZIDE (GLUCOTROL) 5 MG TABLET    Take 0.5 tablets (2.5 mg total) by mouth 2 (two) times daily before a meal.   METFORMIN (GLUCOPHAGE) 1000 MG TABLET    Take 1 tablet (1,000 mg total) by mouth 2 (two) times daily with a meal.    Physical Exam:  Vitals:   12/25/21 0905  BP: 128/80  Pulse: (!) 57  Temp: 97.9 F (36.6 C)  TempSrc: Temporal  SpO2: 96%  Weight: 257 lb (116.6 kg)  Height: 6' 1"  (1.854 m)   Body mass index is 33.91 kg/m. Wt Readings from Last 3 Encounters:  12/25/21 257 lb (116.6 kg)  09/29/21 266 lb 9.6 oz (120.9 kg)  06/23/21 263 lb 3.2 oz (119.4 kg)    Physical Exam Constitutional:      General: He is not in acute distress.    Appearance: He is well-developed. He is not diaphoretic.  HENT:     Head: Normocephalic and atraumatic.     Right Ear: External ear normal.     Left Ear: External ear normal.     Mouth/Throat:     Pharynx: No oropharyngeal exudate.  Eyes:     Conjunctiva/sclera: Conjunctivae normal.     Pupils: Pupils are equal, round, and reactive to light.  Cardiovascular:     Rate and Rhythm: Normal rate and regular rhythm.     Heart sounds: Normal heart sounds.  Pulmonary:     Effort: Pulmonary effort is normal.      Breath sounds: Normal breath sounds.  Abdominal:     General: Bowel sounds are normal.     Palpations: Abdomen is soft.  Musculoskeletal:        General: No tenderness.     Cervical back: Normal range of motion and  neck supple.     Right lower leg: No edema.     Left lower leg: No edema.  Skin:    General: Skin is warm and dry.  Neurological:     Mental Status: He is alert and oriented to person, place, and time.    Labs reviewed: Basic Metabolic Panel: Recent Labs    06/23/21 0954  NA 137  K 4.2  CL 106  CO2 21  GLUCOSE 128*  BUN 18  CREATININE 0.89  CALCIUM 9.3   Liver Function Tests: Recent Labs    06/23/21 0954  AST 28  ALT 36  BILITOT 0.7  PROT 6.7   No results for input(s): LIPASE, AMYLASE in the last 8760 hours. No results for input(s): AMMONIA in the last 8760 hours. CBC: Recent Labs    06/23/21 0954 09/27/21 0912  WBC 2.8* 3.1*  NEUTROABS 1,658 1,714  HGB 12.3* 12.6*  HCT 36.6* 38.2*  MCV 85.5 84.3  PLT 178 179   Lipid Panel: No results for input(s): CHOL, HDL, LDLCALC, TRIG, CHOLHDL, LDLDIRECT in the last 8760 hours. TSH: No results for input(s): TSH in the last 8760 hours. A1C: Lab Results  Component Value Date   HGBA1C 7.5 (H) 09/27/2021     Assessment/Plan 1. Type 2 diabetes mellitus with diabetic polyneuropathy, without long-term current use of insulin (HCC) *** - Hemoglobin A1c  2. Hyperlipidemia, unspecified hyperlipidemia type *** - Lipid panel  3. Essential hypertension *** - CMP with eGFR([facility]) - CBC with Differential/Platelet  4. Malignant neoplasm of prostate (HCC) ***  5. Vitamin D deficiency ***   Return in about 6 months (around 06/27/2022) for routine follow up. Carlos American. Wallace, [facility] [phone]

## 2021-12-26 ENCOUNTER — Other Ambulatory Visit: Payer: Self-pay | Admitting: Internal Medicine

## 2021-12-26 ENCOUNTER — Other Ambulatory Visit: Payer: PPO

## 2021-12-26 ENCOUNTER — Other Ambulatory Visit: Payer: Self-pay | Admitting: Nurse Practitioner

## 2021-12-26 DIAGNOSIS — E785 Hyperlipidemia, unspecified: Secondary | ICD-10-CM

## 2021-12-26 DIAGNOSIS — I10 Essential (primary) hypertension: Secondary | ICD-10-CM

## 2021-12-26 DIAGNOSIS — E1142 Type 2 diabetes mellitus with diabetic polyneuropathy: Secondary | ICD-10-CM

## 2021-12-26 LAB — CBC WITH DIFFERENTIAL/PLATELET
Absolute Monocytes: 444 {cells}/uL (ref 200–950)
Basophils Absolute: 9 {cells}/uL (ref 0–200)
Basophils Relative: 0.3 %
Eosinophils Absolute: 141 {cells}/uL (ref 15–500)
Eosinophils Relative: 4.7 %
HCT: 38.3 % — ABNORMAL LOW (ref 38.5–50.0)
Hemoglobin: 12.7 g/dL — ABNORMAL LOW (ref 13.2–17.1)
Lymphs Abs: 696 {cells}/uL — ABNORMAL LOW (ref 850–3900)
MCH: 27.7 pg (ref 27.0–33.0)
MCHC: 33.2 g/dL (ref 32.0–36.0)
MCV: 83.6 fL (ref 80.0–100.0)
MPV: 10.7 fL (ref 7.5–12.5)
Monocytes Relative: 14.8 %
Neutro Abs: 1710 {cells}/uL (ref 1500–7800)
Neutrophils Relative %: 57 %
Platelets: 171 Thousand/uL (ref 140–400)
RBC: 4.58 Million/uL (ref 4.20–5.80)
RDW: 14.2 % (ref 11.0–15.0)
Total Lymphocyte: 23.2 %
WBC: 3 Thousand/uL — ABNORMAL LOW (ref 3.8–10.8)

## 2021-12-26 LAB — COMPLETE METABOLIC PANEL WITH GFR
AG Ratio: 1.7 (calc) (ref 1.0–2.5)
ALT: 32 U/L (ref 9–46)
AST: 22 U/L (ref 10–35)
Albumin: 4.3 g/dL (ref 3.6–5.1)
Alkaline phosphatase (APISO): 64 U/L (ref 35–144)
BUN: 18 mg/dL (ref 7–25)
CO2: 25 mmol/L (ref 20–32)
Calcium: 9.8 mg/dL (ref 8.6–10.3)
Chloride: 103 mmol/L (ref 98–110)
Creat: 1 mg/dL (ref 0.70–1.35)
Globulin: 2.5 g/dL (calc) (ref 1.9–3.7)
Glucose, Bld: 133 mg/dL (ref 65–139)
Potassium: 4.6 mmol/L (ref 3.5–5.3)
Sodium: 137 mmol/L (ref 135–146)
Total Bilirubin: 0.7 mg/dL (ref 0.2–1.2)
Total Protein: 6.8 g/dL (ref 6.1–8.1)
eGFR: 81 mL/min/{1.73_m2} (ref >=60)

## 2021-12-26 LAB — HEMOGLOBIN A1C
Hgb A1c MFr Bld: 6.8 % of total Hgb — ABNORMAL HIGH (ref <5.7)
Mean Plasma Glucose: 148 mg/dL
eAG (mmol/L): 8.2 mmol/L

## 2021-12-26 LAB — LIPID PANEL
Cholesterol: 125 mg/dL (ref <200)
HDL: 32 mg/dL — ABNORMAL LOW (ref >=40)
LDL Cholesterol (Calc): 68 mg/dL (calc)
Non-HDL Cholesterol (Calc): 93 mg/dL (calc) (ref <130)
Total CHOL/HDL Ratio: 3.9 (calc) (ref <5.0)
Triglycerides: 172 mg/dL — ABNORMAL HIGH (ref <150)

## 2021-12-26 MED ORDER — METFORMIN HCL 1000 MG PO TABS: 1000.0000 mg | ORAL_TABLET | Freq: Two times a day (BID) | ORAL | 1 refills | Status: DC

## 2021-12-29 ENCOUNTER — Ambulatory Visit: Payer: PPO | Admitting: Nurse Practitioner

## 2022-01-13 ENCOUNTER — Other Ambulatory Visit: Payer: Self-pay | Admitting: Nurse Practitioner

## 2022-01-13 DIAGNOSIS — E1142 Type 2 diabetes mellitus with diabetic polyneuropathy: Secondary | ICD-10-CM

## 2022-01-31 DIAGNOSIS — C61 Malignant neoplasm of prostate: Secondary | ICD-10-CM | POA: Diagnosis not present

## 2022-02-08 ENCOUNTER — Ambulatory Visit: Payer: PPO | Admitting: Podiatry

## 2022-02-08 ENCOUNTER — Encounter: Payer: Self-pay | Admitting: Podiatry

## 2022-02-08 DIAGNOSIS — M79675 Pain in left toe(s): Secondary | ICD-10-CM | POA: Diagnosis not present

## 2022-02-08 DIAGNOSIS — M79674 Pain in right toe(s): Secondary | ICD-10-CM | POA: Diagnosis not present

## 2022-02-08 DIAGNOSIS — E1165 Type 2 diabetes mellitus with hyperglycemia: Secondary | ICD-10-CM

## 2022-02-08 DIAGNOSIS — B351 Tinea unguium: Secondary | ICD-10-CM | POA: Diagnosis not present

## 2022-02-08 NOTE — Progress Notes (Signed)
This patient returns to my office for at risk foot care.  This patient requires this care by a professional since this patient will be at risk due to having diabetes.  This patient is unable to cut nails himself since the patient cannot reach his nails.These nails are painful walking and wearing shoes.  This patient presents for at risk foot care today.  General Appearance  Alert, conversant and in no acute stress.  Vascular  Dorsalis pedis and posterior tibial  pulses are palpable  bilaterally.  Capillary return is within normal limits  bilaterally. Temperature is within normal limits  bilaterally.  Neurologic  Senn-Weinstein monofilament wire test within normal limits  bilaterally. Muscle power within normal limits bilaterally.  Nails Thick disfigured discolored nails with subungual debris  from hallux to fifth toes bilaterally. No evidence of bacterial infection or drainage bilaterally.  Orthopedic  No limitations of motion  feet .  No crepitus or effusions noted.  No bony pathology or digital deformities noted.   Skin  normotropic skin with no porokeratosis noted bilaterally.  No signs of infections or ulcers noted.   Asymptomatic porokeratosis.  Onychomycosis  Pain in right toes  Pain in left toes  Consent was obtained for treatment procedures.   Mechanical debridement of nails 1-5  bilaterally performed with a nail nipper.  Filed with dremel without incident.    Return office visit    4   months                 Told patient to return for periodic foot care and evaluation due to potential at risk complications.   Nelle Sayed DPM  

## 2022-03-23 ENCOUNTER — Other Ambulatory Visit: Payer: Self-pay | Admitting: Nurse Practitioner

## 2022-03-23 DIAGNOSIS — E1142 Type 2 diabetes mellitus with diabetic polyneuropathy: Secondary | ICD-10-CM

## 2022-03-28 ENCOUNTER — Telehealth: Payer: Self-pay

## 2022-03-28 DIAGNOSIS — E1142 Type 2 diabetes mellitus with diabetic polyneuropathy: Secondary | ICD-10-CM

## 2022-03-28 NOTE — Telephone Encounter (Signed)
[facility] diabetes nutritional [facility] called needing updated referral for patient.  Message routed to Sherrie Mustache, NP

## 2022-03-29 NOTE — Telephone Encounter (Signed)
Updated order completed.

## 2022-04-09 DIAGNOSIS — M722 Plantar fascial fibromatosis: Secondary | ICD-10-CM | POA: Diagnosis not present

## 2022-04-16 ENCOUNTER — Encounter: Payer: PPO | Attending: Nurse Practitioner | Admitting: Registered"

## 2022-04-16 ENCOUNTER — Encounter: Payer: Self-pay | Admitting: Registered"

## 2022-04-16 ENCOUNTER — Encounter: Payer: Self-pay | Admitting: Nurse Practitioner

## 2022-04-16 DIAGNOSIS — E119 Type 2 diabetes mellitus without complications: Secondary | ICD-10-CM | POA: Insufficient documentation

## 2022-04-16 NOTE — Progress Notes (Signed)
Diabetes Self-Management Education  Visit Type: Follow-up  Appt. Start Time: 0940 Appt. End Time: 1022  04/16/2022  [name], identified by name and date of birth, is a 71 y.o. male with a diagnosis of Diabetes:  Marland Kitchen Type 2  ASSESSMENT  There were no vitals taken for this visit. There is no height or weight on file to calculate BMI.  A1c 6.8% (down from 7.5% 09/2021)  Pt reports he has another MD visit in Nov and last visit he was told that he is doing well.   Pt continues to use CGM and the GMI is 6.3% for the last 90 days.   Pt states he's like his weight to get down to 225 lb. Pt states he doesn't eat much sugar.   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   Physical activity: Pt states he and his wife were doing the exercise videos from his participation in the exercise study but hasn't done lately.   Diabetes Self-Management Education - 04/16/22 0940       Visit Information   Visit Type Follow-up      Complications   Last HgB A1C per patient/outside source 6.8 %    How often do you check your blood sugar? > 4 times/day   CGM     Dietary Intake   Breakfast coffee, bacon, eggs OR toast with butter and a little    Lunch meatloaf, chicken, pinto turnip greens biscuit, ice tea w splenda    Dinner mac & cheese, chicken    Beverage(s) water, crystal light, ice tea with splenda      Activity / Exercise   Activity / Exercise Type ADL's      Individualized Goals (developed by patient)   Nutrition Other (comment)   reading labels   Physical Activity Exercise 5-7 days per week      Patient Self-Evaluation of Goals - Patient rates self as meeting previously set goals (% of time)   Nutrition 50 - 75 % (half of the time)    Physical Activity < 25% (hardly ever/never)    Monitoring >75% (most of the time)      Outcomes   Expected Outcomes Demonstrated interest in learning. Expect positive outcomes    Future DMSE 6  months    Program Status Completed      Subsequent Visit   Since your last visit have you experienced any weight changes? No change    Since your last visit, are you checking your blood glucose at least once a day? Yes            Individualized Plan for Diabetes Self-Management Training:   Learning Objective:  Patient will have a greater understanding of diabetes self-management. Patient education plan is to attend individual and/or group sessions per assessed needs and concerns.  Patient Instructions  For your weight loss goal be aware of calorie dense foods. Start reading labels for serving size, calories Include vegetables daily and consider seasoning with herbs instead of butter. Get back into an exercise routine  Expected Outcomes:  Demonstrated interest in learning. Expect positive outcomes  Education material provided: Food label handouts  If problems or questions, patient to contact team via:  Phone and MyChart  Future DSME appointment: 6 months

## 2022-04-16 NOTE — Patient Instructions (Addendum)
For your weight loss goal be aware of calorie dense foods. Start reading labels for serving size, calories Include vegetables daily and consider seasoning with herbs instead of butter. Get back into an exercise routine

## 2022-04-18 ENCOUNTER — Encounter: Payer: Self-pay | Admitting: Pharmacist

## 2022-04-18 NOTE — Progress Notes (Signed)
[facility])                                            [place] Team                                        Statin Quality Measure Assessment    04/18/2022  Raymond [name] March 19, 1951 [id number]   Per review of chart and payor information, patient has a diagnosis of diabetes but is not currently filling a statin prescription.  This places patient into the SUPD (Statin Use In Patients with Diabetes) measure for CMS.    Patient is on atorvastatin 40 mg but it has not been filled since 11/22 . He has an upcoming follow up appointment on 04/19/22.  If deemed therapeutically appropriate statin therapy could be evaluated at that appointment.  The ASCVD Risk score (Arnett DK, et al., 2019) failed to calculate for the following reasons:   The valid total cholesterol range is 130 to 320 mg/dL 12/25/2021     Component Value Date/Time   CHOL 125 12/25/2021 0932   CHOL 119 01/14/2015 0804   TRIG 172 (H) 12/25/2021 0932   HDL 32 (L) 12/25/2021 0932   HDL 27 (L) 01/14/2015 0804   CHOLHDL 3.9 12/25/2021 0932   VLDL 29 01/16/2017 0837   LDLCALC 68 12/25/2021 0932    Please consider ONE of the following recommendations:  Initiate high intensity statin Atorvastatin '40mg'$  once daily, #90, 3 refills   Rosuvastatin '20mg'$  once daily, #90, 3 refills    Initiate moderate intensity          statin with reduced frequency if prior          statin intolerance 1x weekly, #13, 3 refills   2x weekly, #26, 3 refills   3x weekly, #39, 3 refills    Code for past statin intolerance or  other exclusions (required annually)   Provider Requirements:  Associate code during an office visit or telehealth encounter  Drug Induced Myopathy G72.0   Myopathy, unspecified G72.9   Myositis, unspecified M60.9   Rhabdomyolysis W29.56   Alcoholic fatty liver O13.0   Cirrhosis of liver K74.69   Prediabetes R73.03   PCOS E28.2   Toxic liver disease,  unspecified K71.9   Adverse effect of antihyperlipidemic and antiarteriosclerotic drugs, initial encounter T46.6X5A    Plan: Route note to PCP.  Elayne Guerin, PharmD, [place] Clinical Pharmacist [phone]

## 2022-04-19 ENCOUNTER — Telehealth: Payer: Self-pay

## 2022-04-19 ENCOUNTER — Encounter: Payer: Self-pay | Admitting: Nurse Practitioner

## 2022-04-19 ENCOUNTER — Ambulatory Visit (INDEPENDENT_AMBULATORY_CARE_PROVIDER_SITE_OTHER): Payer: PPO | Admitting: Nurse Practitioner

## 2022-04-19 DIAGNOSIS — Z0000 Encounter for general adult medical examination without abnormal findings: Secondary | ICD-10-CM | POA: Diagnosis not present

## 2022-04-19 NOTE — Progress Notes (Signed)
He is on lipitor.

## 2022-04-19 NOTE — Progress Notes (Signed)
Subjective:   Raymond [name] is a 71 y.o. male who presents for Medicare Annual/Subsequent preventive examination.  Review of Systems     Cardiac Risk Factors include: obesity (BMI >30kg/m2);sedentary lifestyle;hypertension;dyslipidemia;diabetes mellitus;advanced age (>99mn, >>38women);male gender     Objective:    There were no vitals filed for this visit. There is no height or weight on file to calculate BMI.     04/19/2022    9:54 AM 12/25/2021    9:09 AM 09/29/2021   11:29 AM 06/23/2021    9:31 AM 06/21/2021    9:44 AM 04/13/2021    2:22 PM 03/22/2021    9:06 AM  Advanced Directives  Does Patient Have a Medical Advance Directive? No No No No No No No  Does patient want to make changes to medical advance directive?   Yes (MAU/Ambulatory/Procedural Areas - Information given)      Would patient like information on creating a medical advance directive? Yes (MAU/Ambulatory/Procedural Areas - Information given) Yes (MAU/Ambulatory/Procedural Areas - Information given)  No - Patient declined No - Patient declined  Yes (MAU/Ambulatory/Procedural Areas - Information given)    Current Medications (verified) Outpatient Encounter Medications as of 04/19/2022  Medication Sig   aspirin 81 MG tablet Take 81 mg by mouth daily.   atorvastatin (LIPITOR) 40 MG tablet TAKE 1 TABLET BY MOUTH EVERYDAY AT BEDTIME   Calcium Carb-Cholecalciferol (CALCIUM CARBONATE+VITAMIN D PO) Take by mouth.   Cholecalciferol (VITAMIN D3 PO) Take 1,000 Units by mouth 2 (two) times daily.   Continuous Blood Gluc Sensor (FREESTYLE LIBRE 3 SENSOR) MISC PLACE 1 SENSOR ON THE SKIN EVERY 14 DAYS. USE TO CHECK GLUCOSE CONTINUOUSLY   enalapril (VASOTEC) 5 MG tablet TAKE 1 TABLET BY MOUTH EVERY DAY   ibuprofen (ADVIL) 600 MG tablet Take 600 mg by mouth as needed.   JANUVIA 100 MG tablet TAKE 1 TABLET BY MOUTH EVERY DAY   Leuprolide Acetate (LUPRON IJ) Inject as directed every 6 (six) months.   metFORMIN (GLUCOPHAGE) 1000 MG  tablet Take 1 tablet (1,000 mg total) by mouth 2 (two) times daily with a meal.   sildenafil (VIAGRA) 100 MG tablet 0.5-1 tab 1 hour prior to intercourse   tamsulosin (FLOMAX) 0.4 MG CAPS capsule Take 1 capsule (0.4 mg total) by mouth daily.   No facility-administered encounter medications on file as of 04/19/2022.    Allergies (verified) Patient has no known allergies.   History: Past Medical History:  Diagnosis Date   Diabetes mellitus without complication (HCC)    Edema    GERD (gastroesophageal reflux disease)    Hyperlipidemia    Hypertension    Obesity, unspecified    Problems with sexual function    [place]    Sleep apnea    released per MD, no CPAP   Unspecified sleep apnea    Unspecified vitamin D deficiency    Past Surgical History:  Procedure Laterality Date   CATARACT EXTRACTION  1964   Dr.Freed    COLONOSCOPY     HERNIA REPAIR  1987   Right Side, Dr.Kaufman    POLYPECTOMY     PROSTATE BIOPSY     Family History  Problem Relation Age of Onset   Colon cancer Mother 829  Breast cancer Mother        dx. 439s  Lung cancer Father 776  Breast cancer Sister        dx. 539s  Ovarian cancer Sister  dx. 60s   Cancer Maternal Aunt        unknown type   Cancer Maternal Uncle        unknown type   Ovarian cancer Cousin    Breast cancer Cousin 74   Esophageal cancer Neg Hx    Stomach cancer Neg Hx    Rectal cancer Neg Hx    Colon polyps Neg Hx    Social History   Socioeconomic History   Marital status: Married    Spouse name: Not on file   Number of children: 0   Years of education: Not on file   Highest education level: Not on file  Occupational History   Not on file  Tobacco Use   Smoking status: Never   Smokeless tobacco: Never  Vaping Use   Vaping Use: Never used  Substance and Sexual Activity   Alcohol use: No   Drug use: No   Sexual activity: Yes  Other Topics Concern   Not on file  Social History Narrative   Not on  file   Social Determinants of Health   Financial Resource Strain: Low Risk  (07/10/2021)   Overall Financial Resource Strain (CARDIA)    Difficulty of Paying Living Expenses: Not hard at all  Food Insecurity: No Food Insecurity (07/10/2021)   Hunger Vital Sign    Worried About Running Out of Food in the Last Year: Never true    [place] in the Last Year: Never true  Transportation Needs: No Transportation Needs (07/10/2021)   PRAPARE - Hydrologist (Medical): No    Lack of Transportation (Non-Medical): No  Physical Activity: Insufficiently Active (07/10/2021)   Exercise Vital Sign    Days of Exercise per Week: 4 days    Minutes of Exercise per Session: 30 min  Stress: No Stress Concern Present (07/10/2021)   [place]    Feeling of Stress : Not at all  Social Connections: Moderately Integrated (07/10/2021)   Social Connection and Isolation Panel [NHANES]    Frequency of Communication with Friends and Family: More than three times a week    Frequency of Social Gatherings with Friends and Family: Three times a week    Attends Religious Services: 1 to 4 times per year    Active Member of Clubs or Organizations: No    Attends Archivist Meetings: Never    Marital Status: Married    Tobacco Counseling Counseling given: Not Answered   Clinical Intake:  Pre-visit preparation completed: Yes  Pain : No/denies pain     BMI - recorded: 33.9 Nutritional Risks: None Diabetes: Yes  How often do you need to have someone help you when you read instructions, pamphlets, or other written materials from your doctor or pharmacy?: 1 - Never  Diabetic?yes         Activities of Daily Living    04/19/2022   10:23 AM  In your present state of health, do you have any difficulty performing the following activities:  Hearing? 0  Vision? 0  Difficulty concentrating or making  decisions? 0  Walking or climbing stairs? 0  Dressing or bathing? 0  Doing errands, shopping? 0  Preparing Food and eating ? N  Using the Toilet? N  In the past six months, have you accidently leaked urine? Y  Do you have problems with loss of bowel control? Y  Managing your Medications? N  Managing your  Finances? N  Housekeeping or managing your Housekeeping? N    Patient Care Team: [name], NP as PCP - General (Geriatric Medicine) [name], MD as Consulting Physician (Ophthalmology) Tyler Pita, MD as Consulting Physician (Radiation Oncology) Raynelle Bring, MD as Consulting Physician (Urology) Delice Bison, Charlestine Massed, NP as Nurse Practitioner (Hematology and Oncology) Harmon Pier, RN as Registered Nurse  Indicate any recent Medical Services you may have received from other than [facility] providers in the past year (date may be approximate).     Assessment:   This is a routine wellness examination for [facility]  Hearing/Vision screen Hearing Screening - Comments:: No hearing issues  Vision Screening - Comments:: Last exam completed less than 12 months ago with Dr.   Annette Stable issues and exercise activities discussed: Current Exercise Habits: The patient does not participate in regular exercise at present   Goals Addressed   None    Depression Screen    04/19/2022    9:53 AM 12/25/2021    9:08 AM 09/29/2021   11:30 AM 07/10/2021   10:56 AM 04/13/2021    2:20 PM 03/22/2021    9:05 AM 06/27/2020    9:42 AM  PHQ 2/9 Scores  PHQ - 2 Score 0 0 0 0 0 0 0    Fall Risk    04/19/2022    9:53 AM 12/25/2021    9:08 AM 09/29/2021   11:30 AM 06/23/2021    9:31 AM 04/13/2021    2:21 PM  Fall Risk   Falls in the past year? 0 0 0 0 0  Number falls in past yr: 0 0 0 0 0  Injury with Fall? 0 0 0 0 0  Risk for fall due to : No Fall Risks No Fall Risks No Fall Risks No Fall Risks No Fall Risks  Follow up Falls evaluation completed Falls evaluation completed Falls  evaluation completed Falls evaluation completed Falls evaluation completed    FALL RISK PREVENTION PERTAINING TO THE HOME:  Any stairs in or around the home? Yes  If so, are there any without handrails? No  Home free of loose throw rugs in walkways, pet beds, electrical cords, etc? Yes  Adequate lighting in your home to reduce risk of falls? Yes   ASSISTIVE DEVICES UTILIZED TO PREVENT FALLS:  Life alert? No  Use of a cane, walker or w/c? No  Grab bars in the bathroom? Yes  Shower chair or bench in shower? Yes  Elevated toilet seat or a handicapped toilet? Yes   TIMED UP AND GO:  Was the test performed? No .     Cognitive Function:    12/25/2019    9:49 AM 01/27/2018   11:16 AM  MMSE - Mini Mental State Exam  Orientation to time 5 5  Orientation to Place 5 5  Registration 3 3  Attention/ Calculation 5 5  Recall 2 3  Language- name 2 objects 2 2  Language- repeat 1 1  Language- follow 3 step command 3 3  Language- read & follow direction 1 1  Write a sentence 1 1  Copy design 1 1  Total score 29 30        04/19/2022    9:54 AM 04/13/2021    2:22 PM 12/15/2018    8:48 AM  6CIT Screen  What Year? 0 points 0 points 0 points  What month? 0 points 0 points 0 points  What time? 0 points 0 points 0 points  Count back from  20 0 points 0 points 0 points  Months in reverse 0 points 0 points 0 points  Repeat phrase 2 points 2 points 0 points  Total Score 2 points 2 points 0 points    Immunizations Immunization History  Administered Date(s) Administered   Fluad Quad(high Dose 65+) 06/22/2019, 06/27/2020, 06/23/2021   Influenza, High Dose Seasonal PF 08/26/2017   Influenza,inj,Quad PF,6+ Mos 04/01/2013, 07/12/2014, 05/26/2015, 04/17/2016, 05/30/2018   PFIZER(Purple Top)SARS-COV-2 Vaccination 10/02/2019, 10/30/2019, 05/31/2020   Pfizer Covid-19 Vaccine Bivalent Booster 24yr & up 05/29/2021   Pneumococcal Conjugate-13 07/19/2016   Pneumococcal Polysaccharide-23  09/20/2011, 01/27/2018   Td 08/06/2002   Tdap 12/01/2013   Zoster, Live 09/16/2012    TDAP status: Up to date  Flu Vaccine status: Due, Education has been provided regarding the importance of this vaccine. Advised may receive this vaccine at local pharmacy or Health Dept. Aware to provide a copy of the vaccination record if obtained from local pharmacy or Health Dept. Verbalized acceptance and understanding.  Pneumococcal vaccine status: Up to date  Covid-19 vaccine status: Information provided on how to obtain vaccines.   Qualifies for Shingles Vaccine? Yes   Zostavax completed No   Shingrix Completed?: No.    Education has been provided regarding the importance of this vaccine. Patient has been advised to call insurance company to determine out of pocket expense if they have not yet received this vaccine. Advised may also receive vaccine at local pharmacy or Health Dept. Verbalized acceptance and understanding.  Screening Tests Health Maintenance  Topic Date Due   Zoster Vaccines- Shingrix (1 of 2) Never done   Diabetic kidney evaluation - Urine ACR  11/09/2020   COVID-19 Vaccine (5 - Pfizer risk series) 07/24/2021   INFLUENZA VACCINE  03/06/2022   HEMOGLOBIN A1C  06/27/2022   COLONOSCOPY (Pts [id number] coverage will need to be confirmed)  07/20/2022   OPHTHALMOLOGY EXAM  09/18/2022   FOOT EXAM  10/06/2022   Diabetic kidney evaluation - GFR measurement  12/26/2022   TETANUS/TDAP  12/02/2023   Pneumonia Vaccine 6520Years old  Completed   Hepatitis C Screening  Completed   HPV VACCINES  Aged Out    Health Maintenance  Health Maintenance Due  Topic Date Due   Zoster Vaccines- Shingrix (1 of 2) Never done   Diabetic kidney evaluation - Urine ACR  11/09/2020   COVID-19 Vaccine (5 - Pfizer risk series) 07/24/2021   INFLUENZA VACCINE  03/06/2022    Colorectal cancer screening: Type of screening: Colonoscopy. Completed 2020. Repeat every 3 years  Lung Cancer  Screening: (Low Dose CT Chest recommended if Age 70-80ears, 30 pack-year currently smoking OR have quit w/in 15years.) does not qualify.     Additional Screening:  Hepatitis C Screening: does qualify; Completed 2017  Vision Screening: Recommended annual ophthalmology exams for early detection of glaucoma and other disorders of the eye. Is the patient up to date with their annual eye exam?  Yes  Who is the provider or what is the name of the office in which the patient attends annual eye exams? [facility] pt is not established with a provider, would they like to be referred to a provider to establish care? No .   Dental Screening: Recommended annual dental exams for proper oral hygiene  Community Resource Referral / Chronic Care Management: CRR required this visit?  No   CCM required this visit?  No      Plan:     I have personally reviewed and  noted the following in the patient's chart:   Medical and social history Use of alcohol, tobacco or illicit drugs  Current medications and supplements including opioid prescriptions. Patient is not currently taking opioid prescriptions. Functional ability and status Nutritional status Physical activity Advanced directives List of other physicians Hospitalizations, surgeries, and ER visits in previous 12 months Vitals Screenings to include cognitive, depression, and falls Referrals and appointments  In addition, I have reviewed and discussed with patient certain preventive protocols, quality metrics, and best practice recommendations. A written personalized care plan for preventive services as well as general preventive health recommendations were provided to patient.     [name], NP   04/19/2022   Virtual Visit via Telephone Note  I connected with patient 04/19/22 at 10:20 AM EDT by telephone and verified that I am speaking with the correct person using two identifiers.  Location: Patient: home Provider: [facility]     I discussed the limitations, risks, security and privacy concerns of performing an evaluation and management service by telephone and the availability of in person appointments. I also discussed with the patient that there may be a patient responsible charge related to this service. The patient expressed understanding and agreed to proceed.   I discussed the assessment and treatment plan with the patient. The patient was provided an opportunity to ask questions and all were answered. The patient agreed with the plan and demonstrated an understanding of the instructions.   The patient was advised to call back or seek an in-person evaluation if the symptoms worsen or if the condition fails to improve as anticipated.  I provided 15 minutes of non-face-to-face time during this encounter.  [employer]. Harle Battiest Avs printed and mailed

## 2022-04-19 NOTE — Patient Instructions (Signed)
Raymond [name] , Thank you for taking time to come for your Medicare Wellness Visit. I appreciate your ongoing commitment to your health goals. Please review the following plan we discussed and let me know if I can assist you in the future.   Screening recommendations/referrals: Colonoscopy up to date- due in Dec 2023 Recommended yearly ophthalmology/optometry visit for glaucoma screening and checkup Recommended yearly dental visit for hygiene and checkup  Vaccinations: Influenza vaccine due annually in September/October Pneumococcal vaccine up to date Tdap vaccine up to date Shingles vaccine DUE- recommend to get at your local pharmacy    Advanced directives: recommended to complete  Conditions/risks identified: advanced age, diabetes  Next appointment: yearly in person next year  Preventive Care 70 Years and Older, Male Preventive care refers to lifestyle choices and visits with your health care provider that can promote health and wellness. What does preventive care include? A yearly physical exam. This is also called an annual well check. Dental exams once or twice a year. Routine eye exams. Ask your health care provider how often you should have your eyes checked. Personal lifestyle choices, including: Daily care of your teeth and gums. Regular physical activity. Eating a healthy diet. Avoiding tobacco and drug use. Limiting alcohol use. Practicing safe sex. Taking low doses of aspirin every day. Taking vitamin and mineral supplements as recommended by your health care provider. What happens during an annual well check? The services and screenings done by your health care provider during your annual well check will depend on your age, overall health, lifestyle risk factors, and family history of disease. Counseling  Your health care provider may ask you questions about your: Alcohol use. Tobacco use. Drug use. Emotional well-being. Home and relationship well-being. Sexual  activity. Eating habits. History of falls. Memory and ability to understand (cognition). Work and work Statistician. Screening  You may have the following tests or measurements: Height, weight, and BMI. Blood pressure. Lipid and cholesterol levels. These may be checked every 5 years, or more frequently if you are over 82 years old. Skin check. Lung cancer screening. You may have this screening every year starting at age 21 if you have a 30-pack-year history of smoking and currently smoke or have quit within the past 15 years. Fecal occult blood test (FOBT) of the stool. You may have this test every year starting at age 17. Flexible sigmoidoscopy or colonoscopy. You may have a sigmoidoscopy every 5 years or a colonoscopy every 10 years starting at age 14. Prostate cancer screening. Recommendations will vary depending on your family history and other risks. Hepatitis C blood test. Hepatitis B blood test. Sexually transmitted disease (STD) testing. Diabetes screening. This is done by checking your blood sugar (glucose) after you have not eaten for a while (fasting). You may have this done every 1-3 years. Abdominal aortic aneurysm (AAA) screening. You may need this if you are a current or former smoker. Osteoporosis. You may be screened starting at age 35 if you are at high risk. Talk with your health care provider about your test results, treatment options, and if necessary, the need for more tests. Vaccines  Your health care provider may recommend certain vaccines, such as: Influenza vaccine. This is recommended every year. Tetanus, diphtheria, and acellular pertussis (Tdap, Td) vaccine. You may need a Td booster every 10 years. Zoster vaccine. You may need this after age 69. Pneumococcal 13-valent conjugate (PCV13) vaccine. One dose is recommended after age 87. Pneumococcal polysaccharide (PPSV23) vaccine. One dose is  recommended after age 77. Talk to your health care provider about which  screenings and vaccines you need and how often you need them. This information is not intended to replace advice given to you by your health care provider. Make sure you discuss any questions you have with your health care provider. Document Released: 08/19/2015 Document Revised: 04/11/2016 Document Reviewed: 05/24/2015 Elsevier Interactive Patient Education  2017 [place] Prevention in the Home Falls can cause injuries. They can happen to people of all ages. There are many things you can do to make your home safe and to help prevent falls. What can I do on the outside of my home? Regularly fix the edges of walkways and driveways and fix any cracks. Remove anything that might make you trip as you walk through a door, such as a raised step or threshold. Trim any bushes or trees on the path to your home. Use bright outdoor lighting. Clear any walking paths of anything that might make someone trip, such as rocks or tools. Regularly check to see if handrails are loose or broken. Make sure that both sides of any steps have handrails. Any raised decks and porches should have guardrails on the edges. Have any leaves, snow, or ice cleared regularly. Use sand or salt on walking paths during winter. Clean up any spills in your garage right away. This includes oil or grease spills. What can I do in the bathroom? Use night lights. Install grab bars by the toilet and in the tub and shower. Do not use towel bars as grab bars. Use non-skid mats or decals in the tub or shower. If you need to sit down in the shower, use a plastic, non-slip stool. Keep the floor dry. Clean up any water that spills on the floor as soon as it happens. Remove soap buildup in the tub or shower regularly. Attach bath mats securely with double-sided non-slip rug tape. Do not have throw rugs and other things on the floor that can make you trip. What can I do in the bedroom? Use night lights. Make sure that you have a  light by your bed that is easy to reach. Do not use any sheets or blankets that are too big for your bed. They should not hang down onto the floor. Have a firm chair that has side arms. You can use this for support while you get dressed. Do not have throw rugs and other things on the floor that can make you trip. What can I do in the kitchen? Clean up any spills right away. Avoid walking on wet floors. Keep items that you use a lot in easy-to-reach places. If you need to reach something above you, use a strong step stool that has a grab bar. Keep electrical cords out of the way. Do not use floor polish or wax that makes floors slippery. If you must use wax, use non-skid floor wax. Do not have throw rugs and other things on the floor that can make you trip. What can I do with my stairs? Do not leave any items on the stairs. Make sure that there are handrails on both sides of the stairs and use them. Fix handrails that are broken or loose. Make sure that handrails are as long as the stairways. Check any carpeting to make sure that it is firmly attached to the stairs. Fix any carpet that is loose or worn. Avoid having throw rugs at the top or bottom of the stairs. If  you do have throw rugs, attach them to the floor with carpet tape. Make sure that you have a light switch at the top of the stairs and the bottom of the stairs. If you do not have them, ask someone to add them for you. What else can I do to help prevent falls? Wear shoes that: Do not have high heels. Have rubber bottoms. Are comfortable and fit you well. Are closed at the toe. Do not wear sandals. If you use a stepladder: Make sure that it is fully opened. Do not climb a closed stepladder. Make sure that both sides of the stepladder are locked into place. Ask someone to hold it for you, if possible. Clearly mark and make sure that you can see: Any grab bars or handrails. First and last steps. Where the edge of each step  is. Use tools that help you move around (mobility aids) if they are needed. These include: Canes. Walkers. Scooters. Crutches. Turn on the lights when you go into a dark area. Replace any light bulbs as soon as they burn out. Set up your furniture so you have a clear path. Avoid moving your furniture around. If any of your floors are uneven, fix them. If there are any pets around you, be aware of where they are. Review your medicines with your doctor. Some medicines can make you feel dizzy. This can increase your chance of falling. Ask your doctor what other things that you can do to help prevent falls. This information is not intended to replace advice given to you by your health care provider. Make sure you discuss any questions you have with your health care provider. Document Released: 05/19/2009 Document Revised: 12/29/2015 Document Reviewed: 08/27/2014 Elsevier Interactive Patient Education  2017 [employer].

## 2022-04-19 NOTE — Telephone Encounter (Signed)
Mr. jahson, emanuele are scheduled for a virtual visit with your provider today.    Just as we do with appointments in the office, we must obtain your consent to participate.  Your consent will be active for this visit and any virtual visit you may have with one of our providers in the next 365 days.    If you have a MyChart account, I can also send a copy of this consent to you electronically.  All virtual visits are billed to your insurance company just like a traditional visit in the office.  As this is a virtual visit, video technology does not allow for your provider to perform a traditional examination.  This may limit your provider's ability to fully assess your condition.  If your provider identifies any concerns that need to be evaluated in person or the need to arrange testing such as labs, EKG, etc, we will make arrangements to do so.    Although advances in technology are sophisticated, we cannot ensure that it will always work on either your end or our end.  If the connection with a video visit is poor, we may have to switch to a telephone visit.  With either a video or telephone visit, we are not always able to ensure that we have a secure connection.   I need to obtain your verbal consent now.   Are you willing to proceed with your visit today?   HEITH HAIGLER has provided verbal consent on 04/19/2022 for a virtual visit (video or telephone).   Leigh [place], [place] 04/19/2022  9:56 AM

## 2022-04-19 NOTE — Progress Notes (Signed)
   This service is provided via telemedicine  No vital signs collected/recorded due to the encounter was a telemedicine visit.   Location of patient (ex: home, work):  Home  Patient consents to a telephone visit: Yes, see telephone visit dated 04/19/22  Location of the provider (ex: office, home):  [place], [place]   Name of any referring provider:  N/A  Names of all persons participating in the telemedicine service and their role in the encounter:  S.Chrae B/CMA, Sherrie Mustache, NP, and Patient   Time spent on call:  9 min with medical assistant

## 2022-05-03 ENCOUNTER — Ambulatory Visit: Payer: PPO | Admitting: Podiatry

## 2022-05-03 DIAGNOSIS — M24571 Contracture, right ankle: Secondary | ICD-10-CM | POA: Diagnosis not present

## 2022-05-03 DIAGNOSIS — M722 Plantar fascial fibromatosis: Secondary | ICD-10-CM

## 2022-05-03 NOTE — Progress Notes (Signed)
Subjective:  Patient ID: Raymond [name], male    DOB: 1951-03-07,  MRN: [id number]  Chief Complaint  Patient presents with   Plantar Fasciitis    71 y.o. male presents with the above complaint.  Patient presents with right heel pain.  Patient states pain for touch is progressive gotten worse hurts with ambulation hurts with pressure came out of nowhere pain scale 7 out of 10 sharp shooting in nature hurts with taking for step in the morning.  He is tried some over-the-counter treatment none of which has helped.  He denies any other acute complaints   Review of Systems: Negative except as noted in the HPI. Denies N/V/F/Ch.  Past Medical History:  Diagnosis Date   Diabetes mellitus without complication (HCC)    Edema    GERD (gastroesophageal reflux disease)    Hyperlipidemia    Hypertension    Obesity, unspecified    Problems with sexual function    Prostate cancer ([place])    Sleep apnea    released per MD, no CPAP   Unspecified sleep apnea    Unspecified vitamin D deficiency     Current Outpatient Medications:    aspirin 81 MG tablet, Take 81 mg by mouth daily., Disp: , Rfl:    atorvastatin (LIPITOR) 40 MG tablet, TAKE 1 TABLET BY MOUTH EVERYDAY AT BEDTIME, Disp: 90 tablet, Rfl: 1   Calcium Carb-Cholecalciferol (CALCIUM CARBONATE+VITAMIN D PO), Take by mouth., Disp: , Rfl:    Cholecalciferol (VITAMIN D3 PO), Take 1,000 Units by mouth 2 (two) times daily., Disp: , Rfl:    Continuous Blood Gluc Sensor (FREESTYLE LIBRE 3 SENSOR) MISC, PLACE 1 SENSOR ON THE SKIN EVERY 14 DAYS. USE TO CHECK GLUCOSE CONTINUOUSLY, Disp: 2 each, Rfl: 4   enalapril (VASOTEC) 5 MG tablet, TAKE 1 TABLET BY MOUTH EVERY DAY, Disp: 90 tablet, Rfl: 1   ibuprofen (ADVIL) 600 MG tablet, Take 600 mg by mouth as needed., Disp: , Rfl:    JANUVIA 100 MG tablet, TAKE 1 TABLET BY MOUTH EVERY DAY, Disp: 90 tablet, Rfl: 1   Leuprolide Acetate (LUPRON IJ), Inject as directed every 6 (six) months., Disp: , Rfl:     metFORMIN (GLUCOPHAGE) 1000 MG tablet, Take 1 tablet (1,000 mg total) by mouth 2 (two) times daily with a meal., Disp: 180 tablet, Rfl: 1   sildenafil (VIAGRA) 100 MG tablet, 0.5-1 tab 1 hour prior to intercourse, Disp: 10 tablet, Rfl: 0   tamsulosin (FLOMAX) 0.4 MG CAPS capsule, Take 1 capsule (0.4 mg total) by mouth daily., Disp: 30 capsule, Rfl: 12  Social History   Tobacco Use  Smoking Status Never  Smokeless Tobacco Never    No Known Allergies Objective:  There were no vitals filed for this visit. There is no height or weight on file to calculate BMI. Constitutional Well developed. Well nourished.  Vascular Dorsalis pedis pulses palpable bilaterally. Posterior tibial pulses palpable bilaterally. Capillary refill normal to all digits.  No cyanosis or clubbing noted. Pedal hair growth normal.  Neurologic Normal speech. Oriented to person, place, and time. Epicritic sensation to light touch grossly present bilaterally.  Dermatologic Nails well groomed and normal in appearance. No open wounds. No skin lesions.  Orthopedic: Normal joint ROM without pain or crepitus bilaterally. No visible deformities. Tender to palpation at the calcaneal tuber right. No pain with calcaneal squeeze right. Ankle ROM diminished range of motion right. Silfverskiold Test: positive right.   Radiographs: None  Assessment:   1. Plantar fasciitis, right  2. Equinus contracture of right ankle    Plan:  Patient was evaluated and treated and all questions answered.  Plantar Fasciitis, right - XR reviewed as above.  - Educated on icing and stretching. Instructions given.  - Injection delivered to the plantar fascia as below. - DME: Plantar fascial brace dispensed to support the medial longitudinal arch of the foot and offload pressure from the heel and prevent arch collapse during weightbearing - Pharmacologic management: None  Procedure: Injection Tendon/Ligament Location: Right plantar  fascia at the glabrous junction; medial approach. Skin Prep: alcohol Injectate: 0.5 cc 0.5% marcaine plain, 0.5 cc of 1% Lidocaine, 0.5 cc kenalog 10. Disposition: Patient tolerated procedure well. Injection site dressed with a band-aid.  No follow-ups on file.

## 2022-05-23 ENCOUNTER — Other Ambulatory Visit: Payer: Self-pay

## 2022-05-23 DIAGNOSIS — E785 Hyperlipidemia, unspecified: Secondary | ICD-10-CM

## 2022-05-23 NOTE — Telephone Encounter (Signed)
We don't have any samples.  

## 2022-05-23 NOTE — Telephone Encounter (Signed)
Can you see if we have samples to help him through the donut hole?

## 2022-05-23 NOTE — Telephone Encounter (Signed)
Patient called and states that he's in donut hole with Insurance. He states that the price of Januiva has went up. Patient is requesting an alternative medication. Message routed to PCP Dewaine Oats, Carlos American, NP .

## 2022-05-23 NOTE — Telephone Encounter (Signed)
Patient called back to follow up on PCP [name], NP response. Message routed back to PCP.

## 2022-05-23 NOTE — Telephone Encounter (Signed)
He is likely going to run into the same problem with the other medications that are similar. We could restart GLIPIZIDE 2.5 mg to take daily at breakfast and have him monitor blood sugars at this time if he is not able to afford Januiva.

## 2022-05-24 ENCOUNTER — Other Ambulatory Visit: Payer: Self-pay | Admitting: Nurse Practitioner

## 2022-05-24 DIAGNOSIS — E1142 Type 2 diabetes mellitus with diabetic polyneuropathy: Secondary | ICD-10-CM

## 2022-05-25 MED ORDER — GLIPIZIDE ER 2.5 MG PO TB24: 2.5000 mg | ORAL_TABLET | Freq: Every day | ORAL | 0 refills | Status: DC

## 2022-05-25 NOTE — Telephone Encounter (Signed)
Patient called and notified. Medication pend and sent to PCP Dewaine Oats Carlos American, NP for approval. Patient medication list updated.

## 2022-05-25 NOTE — Telephone Encounter (Signed)
Called patient and phone got disconnected.

## 2022-05-28 NOTE — Telephone Encounter (Signed)
Patient medication refilled 05/25/2022 by PCP [name], NP .

## 2022-05-31 ENCOUNTER — Ambulatory Visit: Payer: PPO | Admitting: Podiatry

## 2022-05-31 DIAGNOSIS — M722 Plantar fascial fibromatosis: Secondary | ICD-10-CM | POA: Diagnosis not present

## 2022-05-31 DIAGNOSIS — Q666 Other congenital valgus deformities of feet: Secondary | ICD-10-CM | POA: Diagnosis not present

## 2022-05-31 NOTE — Progress Notes (Signed)
Subjective:  Patient ID: Raymond [name], male    DOB: August 30, 1950,  MRN: [id number]  Chief Complaint  Patient presents with   Plantar Fasciitis    71 y.o. male presents with the above complaint.  Patient Raymond [name] for follow-up of right Planter fasciitis.  Patient states that he is doing well.  He states he still has some residual pain but overall has improved considerably.  Denies any other acute complaints hurts with ambulation worse with pressure sometimes now.   Review of Systems: Negative except as noted in the HPI. Denies N/V/F/Ch.  Past Medical History:  Diagnosis Date   Diabetes mellitus without complication (HCC)    Edema    GERD (gastroesophageal reflux disease)    Hyperlipidemia    Hypertension    Obesity, unspecified    Problems with sexual function    Prostate cancer ([place])    Sleep apnea    released per MD, no CPAP   Unspecified sleep apnea    Unspecified vitamin D deficiency     Current Outpatient Medications:    aspirin 81 MG tablet, Take 81 mg by mouth daily., Disp: , Rfl:    atorvastatin (LIPITOR) 40 MG tablet, TAKE 1 TABLET BY MOUTH EVERYDAY AT BEDTIME, Disp: 90 tablet, Rfl: 1   Calcium Carb-Cholecalciferol (CALCIUM CARBONATE+VITAMIN D PO), Take by mouth., Disp: , Rfl:    Cholecalciferol (VITAMIN D3 PO), Take 1,000 Units by mouth 2 (two) times daily., Disp: , Rfl:    Continuous Blood Gluc Sensor (FREESTYLE LIBRE 3 SENSOR) MISC, PLACE 1 SENSOR ON THE SKIN EVERY 14 DAYS. USE TO CHECK GLUCOSE CONTINUOUSLY, Disp: 2 each, Rfl: 3   enalapril (VASOTEC) 5 MG tablet, TAKE 1 TABLET BY MOUTH EVERY DAY, Disp: 90 tablet, Rfl: 1   glipiZIDE (GLUCOTROL XL) 2.5 MG 24 hr tablet, Take 1 tablet (2.5 mg total) by mouth daily with breakfast., Disp: 90 tablet, Rfl: 0   ibuprofen (ADVIL) 600 MG tablet, Take 600 mg by mouth as needed., Disp: , Rfl:    Leuprolide Acetate (LUPRON IJ), Inject as directed every 6 (six) months., Disp: , Rfl:    metFORMIN (GLUCOPHAGE) 1000 MG tablet, Take 1  tablet (1,000 mg total) by mouth 2 (two) times daily with a meal., Disp: 180 tablet, Rfl: 1   sildenafil (VIAGRA) 100 MG tablet, 0.5-1 tab 1 hour prior to intercourse, Disp: 10 tablet, Rfl: 0   tamsulosin (FLOMAX) 0.4 MG CAPS capsule, Take 1 capsule (0.4 mg total) by mouth daily., Disp: 30 capsule, Rfl: 12  Social History   Tobacco Use  Smoking Status Never  Smokeless Tobacco Never    No Known Allergies Objective:  There were no vitals filed for this visit. There is no height or weight on file to calculate BMI. Constitutional Well developed. Well nourished.  Vascular Dorsalis pedis pulses palpable bilaterally. Posterior tibial pulses palpable bilaterally. Capillary refill normal to all digits.  No cyanosis or clubbing noted. Pedal hair growth normal.  Neurologic Normal speech. Oriented to person, place, and time. Epicritic sensation to light touch grossly present bilaterally.  Dermatologic Nails well groomed and normal in appearance. No open wounds. No skin lesions.  Orthopedic: Normal joint ROM without pain or crepitus bilaterally. No visible deformities. Tender to palpation at the calcaneal tuber right. No pain with calcaneal squeeze right. Ankle ROM diminished range of motion right. Silfverskiold Test: positive right.   Radiographs: None  Assessment:   1. Plantar fasciitis, right   2. Pes planovalgus     Plan:  Patient  was evaluated and treated and all questions answered.  Plantar Fasciitis, right - XR reviewed as above.  - Educated on icing and stretching. Instructions given.  -Second injection delivered to the plantar fascia as below. - DME: Continue plantar fascial brace - Pharmacologic management: None  Pes planovalgus -I explained to patient the etiology of pes planovalgus and relationship with Planter fasciitis and various treatment options were discussed.  Given patient foot structure in the setting of Planter fasciitis I believe patient will benefit  from custom-made orthotics to help control the hindfoot motion support the arch of the foot and take the stress away from plantar fascial.  Patient agrees with the plan like to proceed with orthotics -Ortho recs were dispensed and are functioning well   Procedure: Injection Tendon/Ligament Location: Right plantar fascia at the glabrous junction; medial approach. Skin Prep: alcohol Injectate: 0.5 cc 0.5% marcaine plain, 0.5 cc of 1% Lidocaine, 0.5 cc kenalog 10. Disposition: Patient tolerated procedure well. Injection site dressed with a band-aid.  No follow-ups on file.

## 2022-06-11 ENCOUNTER — Encounter: Payer: Self-pay | Admitting: Podiatry

## 2022-06-11 ENCOUNTER — Ambulatory Visit: Payer: PPO | Admitting: Podiatry

## 2022-06-11 DIAGNOSIS — M79675 Pain in left toe(s): Secondary | ICD-10-CM | POA: Diagnosis not present

## 2022-06-11 DIAGNOSIS — B351 Tinea unguium: Secondary | ICD-10-CM

## 2022-06-11 DIAGNOSIS — M79674 Pain in right toe(s): Secondary | ICD-10-CM

## 2022-06-11 DIAGNOSIS — E1165 Type 2 diabetes mellitus with hyperglycemia: Secondary | ICD-10-CM | POA: Diagnosis not present

## 2022-06-11 NOTE — Progress Notes (Signed)
This patient returns to my office for at risk foot care.  This patient requires this care by a professional since this patient will be at risk due to having diabetes.  This patient is unable to cut nails himself since the patient cannot reach his nails.These nails are painful walking and wearing shoes.  This patient presents for at risk foot care today.  General Appearance  Alert, conversant and in no acute stress.  Vascular  Dorsalis pedis and posterior tibial  pulses are palpable  bilaterally.  Capillary return is within normal limits  bilaterally. Temperature is within normal limits  bilaterally.  Neurologic  Senn-Weinstein monofilament wire test within normal limits  bilaterally. Muscle power within normal limits bilaterally.  Nails Thick disfigured discolored nails with subungual debris  from hallux to fifth toes bilaterally. No evidence of bacterial infection or drainage bilaterally.  Orthopedic  No limitations of motion  feet .  No crepitus or effusions noted.  No bony pathology or digital deformities noted.   Skin  normotropic skin with no porokeratosis noted bilaterally.  No signs of infections or ulcers noted.   Asymptomatic porokeratosis.  Onychomycosis  Pain in right toes  Pain in left toes  Consent was obtained for treatment procedures.   Mechanical debridement of nails 1-5  bilaterally performed with a nail nipper.  Filed with dremel without incident.    Return office visit    4   months                 Told patient to return for periodic foot care and evaluation due to potential at risk complications.   Almarosa Bohac DPM  

## 2022-06-25 ENCOUNTER — Other Ambulatory Visit: Payer: Self-pay

## 2022-06-25 ENCOUNTER — Other Ambulatory Visit: Payer: PPO

## 2022-06-25 ENCOUNTER — Ambulatory Visit (INDEPENDENT_AMBULATORY_CARE_PROVIDER_SITE_OTHER): Payer: PPO | Admitting: Nurse Practitioner

## 2022-06-25 ENCOUNTER — Encounter: Payer: Self-pay | Admitting: Nurse Practitioner

## 2022-06-25 VITALS — BP 124/86 | HR 61 | Temp 97.1°F | Ht 73.0 in | Wt 252.0 lb

## 2022-06-25 DIAGNOSIS — E1142 Type 2 diabetes mellitus with diabetic polyneuropathy: Secondary | ICD-10-CM

## 2022-06-25 DIAGNOSIS — M216X2 Other acquired deformities of left foot: Secondary | ICD-10-CM | POA: Diagnosis not present

## 2022-06-25 DIAGNOSIS — I10 Essential (primary) hypertension: Secondary | ICD-10-CM

## 2022-06-25 DIAGNOSIS — E785 Hyperlipidemia, unspecified: Secondary | ICD-10-CM | POA: Diagnosis not present

## 2022-06-25 NOTE — Progress Notes (Signed)
Careteam: Patient Care Team: [name], NP as PCP - General (Geriatric Medicine) [name], MD as Consulting Physician (Ophthalmology) Tyler Pita, MD as Consulting Physician (Radiation Oncology) Raynelle Bring, MD as Consulting Physician (Urology) Delice Bison, Charlestine Massed, NP as Nurse Practitioner (Hematology and Oncology) Harmon Pier, RN as Registered Nurse  PLACE OF SERVICE:  [place] Directive information Does Patient Have a Medical Advance Directive?: No, Would patient like information on creating a medical advance directive?: Yes (MAU/Ambulatory/Procedural Areas - Information given)  No Known Allergies  Chief Complaint  Patient presents with   Medical Management of Chronic Issues    6 month follow-up. Discuss need for shingrix, diabetic kidney evaluation, and covid booster or post pone if patient refuses.      HPI: Patient is a 71 y.o. male for routine follow up.   Doing well, trying to eat better.  No significant hypoglycemia.  Upcoming eye appt  Prostate cancer- continues to follow up with urology every 6 months. No new symptoms.   Not exercising but more active.   He has had plantar fascitis- saw podiatrist- has had 2 injection. At one time had significant pain Has changed shoes to help.   He has flu and covid vaccine at pharmacy.   Htn- controlled, compliant with medications.   Review of Systems:  Review of Systems  Constitutional:  Negative for chills, fever and weight loss.  HENT:  Negative for tinnitus.   Respiratory:  Negative for cough, sputum production and shortness of breath.   Cardiovascular:  Negative for chest pain, palpitations and leg swelling.  Gastrointestinal:  Negative for abdominal pain, constipation, diarrhea and heartburn.  Genitourinary:  Negative for dysuria, frequency and urgency.  Musculoskeletal:  Negative for back pain, falls, joint pain and myalgias.  Skin: Negative.   Neurological:   Negative for dizziness and headaches.  Psychiatric/Behavioral:  Negative for depression and memory loss. The patient does not have insomnia.     Past Medical History:  Diagnosis Date   Diabetes mellitus without complication (HCC)    Edema    GERD (gastroesophageal reflux disease)    Hyperlipidemia    Hypertension    Obesity, unspecified    Problems with sexual function    Prostate cancer ([place])    Sleep apnea    released per MD, no CPAP   Unspecified sleep apnea    Unspecified vitamin D deficiency    Past Surgical History:  Procedure Laterality Date   CATARACT EXTRACTION  1964   Dr.Freed    COLONOSCOPY     HERNIA REPAIR  1987   Right Side, Dr.Kaufman    POLYPECTOMY     PROSTATE BIOPSY     Social History:   reports that he has never smoked. He has never used smokeless tobacco. He reports that he does not drink alcohol and does not use drugs.  Family History  Problem Relation Age of Onset   Colon cancer Mother 17   Breast cancer Mother        dx. 91s   Lung cancer Father 98   Breast cancer Sister        dx. 74s   Ovarian cancer Sister        dx. 56s   Cancer Maternal Aunt        unknown type   Cancer Maternal Uncle        unknown type   Ovarian cancer Cousin    Breast cancer Cousin 74   Esophageal cancer  Neg Hx    Stomach cancer Neg Hx    Rectal cancer Neg Hx    Colon polyps Neg Hx     Medications: Patient's Medications  New Prescriptions   No medications on file  Previous Medications   ASPIRIN 81 MG TABLET    Take 81 mg by mouth daily.   ATORVASTATIN (LIPITOR) 40 MG TABLET    TAKE 1 TABLET BY MOUTH EVERYDAY AT BEDTIME   CALCIUM CARB-CHOLECALCIFEROL (CALCIUM CARBONATE+VITAMIN D PO)    Take by mouth.   CHOLECALCIFEROL (VITAMIN D3 PO)    Take 1,000 Units by mouth 2 (two) times daily.   CONTINUOUS BLOOD GLUC SENSOR (FREESTYLE LIBRE 3 SENSOR) MISC    PLACE 1 SENSOR ON THE SKIN EVERY 14 DAYS. USE TO CHECK GLUCOSE CONTINUOUSLY   ENALAPRIL (VASOTEC) 5 MG TABLET     TAKE 1 TABLET BY MOUTH EVERY DAY   GLIPIZIDE (GLUCOTROL XL) 2.5 MG 24 HR TABLET    Take 1 tablet (2.5 mg total) by mouth daily with breakfast.   IBUPROFEN (ADVIL) 600 MG TABLET    Take 600 mg by mouth as needed.   LEUPROLIDE ACETATE (LUPRON IJ)    Inject as directed every 6 (six) months.   METFORMIN (GLUCOPHAGE) 1000 MG TABLET    Take 1 tablet (1,000 mg total) by mouth 2 (two) times daily with a meal.   SILDENAFIL (VIAGRA) 100 MG TABLET    0.5-1 tab 1 hour prior to intercourse   TAMSULOSIN (FLOMAX) 0.4 MG CAPS CAPSULE    Take 1 capsule (0.4 mg total) by mouth daily.  Modified Medications   No medications on file  Discontinued Medications   No medications on file    Physical Exam:  Vitals:   06/25/22 0846  BP: 124/86  Pulse: 61  Temp: (!) 97.1 F (36.2 C)  TempSrc: Temporal  SpO2: 98%  Weight: 252 lb (114.3 kg)  Height: _0  (1.854 m)   Body mass index is 33.25 kg/m. Wt Readings from Last 3 Encounters:  06/25/22 252 lb (114.3 kg)  12/25/21 257 lb (116.6 kg)  09/29/21 266 lb 9.6 oz (120.9 kg)    Physical Exam Constitutional:      General: He is not in acute distress.    Appearance: He is well-developed. He is not diaphoretic.  HENT:     Head: Normocephalic and atraumatic.     Right Ear: External ear normal.     Left Ear: External ear normal.     Mouth/Throat:     Pharynx: No oropharyngeal exudate.  Eyes:     Conjunctiva/sclera: Conjunctivae normal.     Pupils: Pupils are equal, round, and reactive to light.  Cardiovascular:     Rate and Rhythm: Normal rate and regular rhythm.     Heart sounds: Normal heart sounds.  Pulmonary:     Effort: Pulmonary effort is normal.     Breath sounds: Normal breath sounds.  Abdominal:     General: Bowel sounds are normal.     Palpations: Abdomen is soft.  Musculoskeletal:        General: No tenderness.     Cervical back: Normal range of motion and neck supple.     Right lower leg: No edema.     Left lower leg: No edema.   Skin:    General: Skin is warm and dry.  Neurological:     Mental Status: He is alert and oriented to person, place, and time.     Labs reviewed: Basic  Metabolic Panel: Recent Labs    12/25/21 0932  NA 137  K 4.6  CL 103  CO2 25  GLUCOSE 133  BUN 18  CREATININE 1.00  CALCIUM 9.8   Liver Function Tests: Recent Labs    12/25/21 0932  AST 22  ALT 32  BILITOT 0.7  PROT 6.8   No results for input(s): "LIPASE", "AMYLASE" in the last 8760 hours. No results for input(s): "AMMONIA" in the last 8760 hours. CBC: Recent Labs    09/27/21 0912 12/25/21 0932  WBC 3.1* 3.0*  NEUTROABS 1,714 1,710  HGB 12.6* 12.7*  HCT 38.2* 38.3*  MCV 84.3 83.6  PLT 179 171   Lipid Panel: Recent Labs    12/25/21 0932  CHOL 125  HDL 32*  LDLCALC 68  TRIG 172*  CHOLHDL 3.9   TSH: No results for input(s): "TSH" in the last 8760 hours. A1C: Lab Results  Component Value Date   HGBA1C 6.8 (H) 12/25/2021     Assessment/Plan 1. Type 2 diabetes mellitus with diabetic polyneuropathy, without long-term current use of insulin (HCC) -Encouraged dietary compliance, routine foot care/monitoring and to keep up with diabetic eye exams through ophthalmology  -continue current medications, blood sugars have been well controled.  - Microalbumin / creatinine urine ratio - Hemoglobin A1c  2. Hyperlipidemia, unspecified hyperlipidemia type -continues on lipitor 40 mg daily with dietary modifications.  - Lipid panel  3. Essential hypertension -Blood pressure well controlled, goal bp <140/90 Continue current medications and dietary modifications follow metabolic panel - CMP with eGFR([facility]) - CBC with Differential/Platelet  4. Plantar flexed metatarsal bone of left foot -improved with exercise, injection, and change in foot wear.   5. Morbid obesity ([place]) --education provided on healthy weight loss through increase in physical activity and proper nutrition    Return in about 6 months  (around 12/24/2022) for routine follow up.: Raymond [name] K. [place], [facility] [phone]

## 2022-06-26 LAB — CBC WITH DIFFERENTIAL/PLATELET
Absolute Monocytes: 413 cells/uL (ref 200–950)
Basophils Absolute: 10 cells/uL (ref 0–200)
Basophils Relative: 0.3 %
Eosinophils Absolute: 142 cells/uL (ref 15–500)
Eosinophils Relative: 4.3 %
HCT: 38.9 % (ref 38.5–50.0)
Hemoglobin: 12.8 g/dL — ABNORMAL LOW (ref 13.2–17.1)
Lymphs Abs: 898 cells/uL (ref 850–3900)
MCH: 27.1 pg (ref 27.0–33.0)
MCHC: 32.9 g/dL (ref 32.0–36.0)
MCV: 82.2 fL (ref 80.0–100.0)
MPV: 10.6 fL (ref 7.5–12.5)
Monocytes Relative: 12.5 %
Neutro Abs: 1838 cells/uL (ref 1500–7800)
Neutrophils Relative %: 55.7 %
Platelets: 199 10*3/uL (ref 140–400)
RBC: 4.73 10*6/uL (ref 4.20–5.80)
RDW: 14.1 % (ref 11.0–15.0)
Total Lymphocyte: 27.2 %
WBC: 3.3 10*3/uL — ABNORMAL LOW (ref 3.8–10.8)

## 2022-06-26 LAB — COMPLETE METABOLIC PANEL WITH GFR
AG Ratio: 1.9 (calc) (ref 1.0–2.5)
ALT: 33 U/L (ref 9–46)
AST: 20 U/L (ref 10–35)
Albumin: 4.5 g/dL (ref 3.6–5.1)
Alkaline phosphatase (APISO): 54 U/L (ref 35–144)
BUN: 20 mg/dL (ref 7–25)
CO2: 24 mmol/L (ref 20–32)
Calcium: 9.4 mg/dL (ref 8.6–10.3)
Chloride: 103 mmol/L (ref 98–110)
Creat: 0.93 mg/dL (ref 0.70–1.28)
Globulin: 2.4 g/dL (calc) (ref 1.9–3.7)
Glucose, Bld: 129 mg/dL — ABNORMAL HIGH (ref 65–99)
Potassium: 4.3 mmol/L (ref 3.5–5.3)
Sodium: 138 mmol/L (ref 135–146)
Total Bilirubin: 0.7 mg/dL (ref 0.2–1.2)
Total Protein: 6.9 g/dL (ref 6.1–8.1)
eGFR: 88 mL/min/{1.73_m2} (ref >=60)

## 2022-06-26 LAB — LIPID PANEL
Cholesterol: 123 mg/dL (ref <200)
HDL: 34 mg/dL — ABNORMAL LOW (ref >=40)
LDL Cholesterol (Calc): 69 mg/dL (calc)
Non-HDL Cholesterol (Calc): 89 mg/dL (calc) (ref <130)
Total CHOL/HDL Ratio: 3.6 (calc) (ref <5.0)
Triglycerides: 122 mg/dL (ref <150)

## 2022-06-26 LAB — MICROALBUMIN / CREATININE URINE RATIO
Creatinine, Urine: 190 mg/dL (ref 20–320)
Microalb Creat Ratio: 12 mcg/mg creat (ref <30)
Microalb, Ur: 2.3 mg/dL

## 2022-06-26 LAB — HEMOGLOBIN A1C
Hgb A1c MFr Bld: 7.1 % of total Hgb — ABNORMAL HIGH (ref <5.7)
Mean Plasma Glucose: 157 mg/dL
eAG (mmol/L): 8.7 mmol/L

## 2022-07-13 ENCOUNTER — Other Ambulatory Visit: Payer: Self-pay | Admitting: Nurse Practitioner

## 2022-07-13 DIAGNOSIS — E1142 Type 2 diabetes mellitus with diabetic polyneuropathy: Secondary | ICD-10-CM

## 2022-07-17 ENCOUNTER — Other Ambulatory Visit: Payer: Self-pay | Admitting: Nurse Practitioner

## 2022-07-17 DIAGNOSIS — E785 Hyperlipidemia, unspecified: Secondary | ICD-10-CM

## 2022-08-15 DIAGNOSIS — C61 Malignant neoplasm of prostate: Secondary | ICD-10-CM | POA: Diagnosis not present

## 2022-08-22 DIAGNOSIS — C61 Malignant neoplasm of prostate: Secondary | ICD-10-CM | POA: Diagnosis not present

## 2022-09-02 ENCOUNTER — Other Ambulatory Visit: Payer: Self-pay | Admitting: Nurse Practitioner

## 2022-09-03 NOTE — Telephone Encounter (Signed)
Medication has warnings.

## 2022-09-15 ENCOUNTER — Other Ambulatory Visit: Payer: Self-pay | Admitting: Nurse Practitioner

## 2022-09-15 DIAGNOSIS — E1142 Type 2 diabetes mellitus with diabetic polyneuropathy: Secondary | ICD-10-CM

## 2022-09-20 DIAGNOSIS — H524 Presbyopia: Secondary | ICD-10-CM | POA: Diagnosis not present

## 2022-09-20 DIAGNOSIS — H25812 Combined forms of age-related cataract, left eye: Secondary | ICD-10-CM | POA: Diagnosis not present

## 2022-09-20 DIAGNOSIS — E119 Type 2 diabetes mellitus without complications: Secondary | ICD-10-CM | POA: Diagnosis not present

## 2022-09-20 DIAGNOSIS — H40013 Open angle with borderline findings, low risk, bilateral: Secondary | ICD-10-CM | POA: Diagnosis not present

## 2022-09-20 DIAGNOSIS — H04123 Dry eye syndrome of bilateral lacrimal glands: Secondary | ICD-10-CM | POA: Diagnosis not present

## 2022-09-20 LAB — HM DIABETES EYE EXAM

## 2022-09-26 ENCOUNTER — Encounter: Payer: Self-pay | Admitting: Gastroenterology

## 2022-10-01 DIAGNOSIS — L821 Other seborrheic keratosis: Secondary | ICD-10-CM | POA: Diagnosis not present

## 2022-10-01 DIAGNOSIS — L814 Other melanin hyperpigmentation: Secondary | ICD-10-CM | POA: Diagnosis not present

## 2022-10-01 DIAGNOSIS — L570 Actinic keratosis: Secondary | ICD-10-CM | POA: Diagnosis not present

## 2022-10-01 DIAGNOSIS — D225 Melanocytic nevi of trunk: Secondary | ICD-10-CM | POA: Diagnosis not present

## 2022-10-04 ENCOUNTER — Encounter: Payer: Self-pay | Admitting: Nurse Practitioner

## 2022-10-11 ENCOUNTER — Encounter: Payer: Self-pay | Admitting: Podiatry

## 2022-10-11 ENCOUNTER — Ambulatory Visit: Payer: PPO | Admitting: Podiatry

## 2022-10-11 DIAGNOSIS — B351 Tinea unguium: Secondary | ICD-10-CM

## 2022-10-11 DIAGNOSIS — M79674 Pain in right toe(s): Secondary | ICD-10-CM | POA: Diagnosis not present

## 2022-10-11 DIAGNOSIS — M79675 Pain in left toe(s): Secondary | ICD-10-CM

## 2022-10-11 NOTE — Progress Notes (Signed)
This patient returns to my office for at risk foot care.  This patient requires this care by a professional since this patient will be at risk due to having diabetes.  This patient is unable to cut nails himself since the patient cannot reach his nails.These nails are painful walking and wearing shoes.  This patient presents for at risk foot care today.  General Appearance  Alert, conversant and in no acute stress.  Vascular  Dorsalis pedis and posterior tibial  pulses are palpable  bilaterally.  Capillary return is within normal limits  bilaterally. Temperature is within normal limits  bilaterally.  Neurologic  Senn-Weinstein monofilament wire test within normal limits  bilaterally. Muscle power within normal limits bilaterally.  Nails Thick disfigured discolored nails with subungual debris  from hallux to fifth toes bilaterally. No evidence of bacterial infection or drainage bilaterally.  Orthopedic  No limitations of motion  feet .  No crepitus or effusions noted.  No bony pathology or digital deformities noted.   Skin  normotropic skin with no porokeratosis noted bilaterally.  No signs of infections or ulcers noted.   Asymptomatic porokeratosis.  Onychomycosis  Pain in right toes  Pain in left toes  Consent was obtained for treatment procedures.   Mechanical debridement of nails 1-5  bilaterally performed with a nail nipper.  Filed with dremel without incident.    Return office visit    4   months                 Told patient to return for periodic foot care and evaluation due to potential at risk complications.   Gardiner Barefoot DPM

## 2022-10-19 ENCOUNTER — Ambulatory Visit: Payer: PPO | Admitting: Registered"

## 2022-10-22 ENCOUNTER — Encounter: Payer: PPO | Attending: Nurse Practitioner | Admitting: Registered"

## 2022-10-22 ENCOUNTER — Encounter: Payer: Self-pay | Admitting: Registered"

## 2022-10-22 DIAGNOSIS — E1165 Type 2 diabetes mellitus with hyperglycemia: Secondary | ICD-10-CM | POA: Diagnosis not present

## 2022-10-22 NOTE — Progress Notes (Signed)
Diabetes Self-Management Education  Visit Type:    Appt. Start Time: 1456 Appt. End Time: [id number]  10/22/2022  [name], identified by name and date of birth, is a 72 y.o. male with a diagnosis of Diabetes:  Marland Kitchen Type 2  ASSESSMENT  There were no vitals taken for this visit. There is no height or weight on file to calculate BMI. Wt Readings from Last 3 Encounters:  06/25/22 252 lb (114.3 kg)  12/25/21 257 lb (116.6 kg)  09/29/21 266 lb 9.6 oz (120.9 kg)    Lab Results  Component Value Date   HGBA1C 7.1 (H) 06/25/2022   DM Medications: Metformin 500 bid, glipizide  Pt reports he and his wife stay up until 1-2 am to watch a show after the news and likes to sleep until ~8:30 am  Pt states sometimes after breakfast 170-200 mg/dL. Pt states if he has toast with honey it goes higher. Pt reports that sometimes he has low blood sugar sxs and may be over treating, drinking too much juice.  Pt states another routine they have is going out to eat after church. Pt states sometimes the ladies like to go shopping and will get some walking in that way.  Pt states he is disappointed because he has not lost more weight.   Diabetes Self-Management Education - 10/22/22 1542       Visit Information   Visit Type Follow-up      Pre-Education Assessment   Patient understands incorporating nutritional management into lifestyle. Comprehends key points    Patient undertands incorporating physical activity into lifestyle. Needs Review    Patient understands monitoring blood glucose, interpreting and using results Comprehends key points    Patient understands prevention, detection, and treatment of acute complications. Comprehends key points    Patient understands how to develop strategies to promote health/change behavior. Comprehends key points      Complications   How often do you check your blood sugar? > 4 times/day      Activity / Exercise   Activity / Exercise Type ADL's      Patient  Education   Healthy Eating Role of diet in the treatment of diabetes and the relationship between the three main macronutrients and blood glucose level;Meal options for control of blood glucose level and chronic complications.    Being Active Role of exercise on diabetes management, blood pressure control and cardiac health.    Monitoring Taught/evaluated CGM (comment)    Acute complications Taught prevention, symptoms, and  treatment of hypoglycemia - the 15 rule.      Individualized Goals (developed by patient)   Nutrition Other (comment)   better snacks while working   Physical Activity Exercise 3-5 times per week    Monitoring  Consistenly use CGM    Reducing Risk treat hypoglycemia with 15 grams of carbs if blood glucose less than 70mg /dL      Patient Self-Evaluation of Goals - Patient rates self as meeting previously set goals (% of time)   Nutrition 50 - 75 % (half of the time)    Monitoring >75% (most of the time)      Post-Education Assessment   Patient understands incorporating nutritional management into lifestyle. Comprehends key points    Patient undertands incorporating physical activity into lifestyle. Comprehends key points    Patient understands monitoring blood glucose, interpreting and using results Comprehends key points    Patient understands prevention, detection, and treatment of acute complications. Comprehends key points  Outcomes   Expected Outcomes Demonstrated interest in learning. Expect positive outcomes    Future DMSE 3-4 months    Program Status Not Completed      Subsequent Visit   Since your last visit have you continued or begun to take your medications as prescribed? Yes    Since your last visit have you experienced any weight changes? No change    Since your last visit, are you checking your blood glucose at least once a day? Yes   CGM           Individualized Plan for Diabetes Self-Management Training:   Learning Objective:  Patient  will have a greater understanding of diabetes self-management. Patient education plan is to attend individual and/or group sessions per assessed needs and concerns.  Patient Instructions  Consider changing your nighttime routine so you are going to bed earlier. If you stay up late avoid eating after 8 pm. 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 Consider increasing your exercise, in the evening may help your morning blood sugar. Start using the Notes in your [place] app to track different things such as food, low blood sugar, etc. Lower blood sugar - avoid over treating, 1/2 cup juice or soda and then follow-up with protein about 15 min after to stabilize. 85 with symptoms peanut butter crackers should be enough to bring up. Look at the Nutrition Facts on some of the menu items, look saturated fat and try to stay below 100% daily.  Expected Outcomes:  Demonstrated interest in learning. Expect positive outcomes  Education material provided: none  If problems or questions, patient to contact team via:  Phone and MyChart  Future DSME appointment: 3-4 months

## 2022-10-22 NOTE — Patient Instructions (Signed)
Consider changing your nighttime routine so you are going to bed earlier. If you stay up late avoid eating after 8 pm. 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 Consider increasing your exercise, in the evening may help your morning blood sugar. Start using the Notes in your [place] app to track different things such as food, low blood sugar, etc. Lower blood sugar - avoid over treating, 1/2 cup juice or soda and then follow-up with protein about 15 min after to stabilize. 85 with symptoms peanut butter crackers should be enough to bring up. Look at the Nutrition Facts on some of the menu items, look saturated fat and try to stay below 100% daily.

## 2022-11-16 ENCOUNTER — Encounter: Payer: Self-pay | Admitting: Gastroenterology

## 2022-11-21 DIAGNOSIS — J00 Acute nasopharyngitis [common cold]: Secondary | ICD-10-CM | POA: Diagnosis not present

## 2022-11-29 ENCOUNTER — Ambulatory Visit (HOSPITAL_COMMUNITY)
Admission: EM | Admit: 2022-11-29 | Discharge: 2022-11-29 | Disposition: A | Discharge status: Home / Self Care | Payer: PPO | Attending: Physician Assistant | Admitting: Physician Assistant

## 2022-11-29 ENCOUNTER — Ambulatory Visit (INDEPENDENT_AMBULATORY_CARE_PROVIDER_SITE_OTHER): Payer: PPO

## 2022-11-29 ENCOUNTER — Encounter (HOSPITAL_COMMUNITY): Payer: Self-pay

## 2022-11-29 DIAGNOSIS — J9811 Atelectasis: Secondary | ICD-10-CM | POA: Diagnosis not present

## 2022-11-29 DIAGNOSIS — W19XXXA Unspecified fall, initial encounter: Secondary | ICD-10-CM

## 2022-11-29 DIAGNOSIS — S20212A Contusion of left front wall of thorax, initial encounter: Secondary | ICD-10-CM | POA: Diagnosis not present

## 2022-11-29 MED ORDER — IBUPROFEN 600 MG PO TABS: 600.0000 mg | ORAL_TABLET | Freq: Three times a day (TID) | ORAL | 0 refills | Status: DC | PRN

## 2022-11-29 MED ORDER — LIDOCAINE 5 % EX PTCH: 1.0000 | MEDICATED_PATCH | CUTANEOUS | 0 refills | Status: DC

## 2022-11-29 NOTE — ED Provider Notes (Signed)
[facility]    CSN: [id number] Arrival date & time: 11/29/22  1830      History   Chief Complaint Chief Complaint  Patient presents with   Fall    HPI Raymond [name] is a 72 y.o. male.   Patient presents today for evaluation of left rib pain following fall.  Reports that he was standing over the edge of a trailer when this thing he was standing on gave out and he fell with the majority of his weight on his anterior left ribs.  He reports ongoing pain since that time.  Pain is rated 5 on a 0-10 pain scale, described as aching, no aggravating leaving factors identified.  Denies any shortness of breath, chest pain, nausea, vomiting.  He did not hit his head and denies any headaches, dizziness, amnesia surrounding event, blood thinning medication.  He just wanted to ensure that he did not break a rib.  Reports he is otherwise feeling his normal self and able to perform daily duties.    Past Medical History:  Diagnosis Date   Diabetes mellitus without complication (HCC)    Edema    GERD (gastroesophageal reflux disease)    Hyperlipidemia    Hypertension    Obesity, unspecified    Problems with sexual function    Prostate cancer (HCC)    Sleep apnea    released per MD, no CPAP   Unspecified sleep apnea    Unspecified vitamin D deficiency     Patient Active Problem List   Diagnosis Date Noted   Genetic testing 08/22/2021   Malignant neoplasm of prostate (HCC) 01/05/2021   Pain due to onychomycosis of toenails of both feet 12/07/2019   Porokeratosis 12/07/2019   Plantar flexed metatarsal bone of left foot 12/07/2019   Type 2 diabetes mellitus without complication (HCC) 08/04/2019   Calcaneal spur 09/06/2017   Type 2 diabetes mellitus with hyperglycemia, without long-term current use of insulin (HCC) 04/01/2013   Morbid obesity (HCC)    General medical examination 03/07/2010   Hyperlipemia 03/16/2009   OBSTRUCTIVE SLEEP APNEA 03/16/2009   Essential hypertension  03/15/2009   Vitamin D deficiency 03/04/2009   Edema 02/14/2009   GERD (gastroesophageal reflux disease) 10/22/2007   Unspecified sleep apnea 07/03/2005    Past Surgical History:  Procedure Laterality Date   CATARACT EXTRACTION  1964   Dr.Freed    COLONOSCOPY     HERNIA REPAIR  1987   Right Side, Dr.Kaufman    POLYPECTOMY     PROSTATE BIOPSY         Home Medications    Prior to Admission medications   Medication Sig Start Date End Date Taking? Authorizing Provider  lidocaine (LIDODERM) 5 % Place 1 patch onto the skin daily. Remove & Discard patch within 12 hours or as directed by MD 11/29/22  Yes Ashlye Oviedo, Noberto Retort, PA-C  aspirin 81 MG tablet Take 81 mg by mouth daily.    [provider]  atorvastatin (LIPITOR) 40 MG tablet TAKE 1 TABLET BY MOUTH EVERYDAY AT BEDTIME 07/17/22   Sharon Seller, NP  Calcium Carb-Cholecalciferol (CALCIUM CARBONATE+VITAMIN D PO) Take by mouth.    [provider]  Cholecalciferol (VITAMIN D3 PO) Take 1,000 Units by mouth 2 (two) times daily.    [provider]  Continuous Blood Gluc Sensor (FREESTYLE LIBRE 3 SENSOR) MISC PLACE 1 SENSOR ON THE SKIN EVERY 14 DAYS. USE TO CHECK GLUCOSE CONTINUOUSLY 09/17/22   Sharon Seller, NP  enalapril (  VASOTEC) 5 MG tablet TAKE 1 TABLET BY MOUTH EVERY DAY 07/13/22   Sharon Seller, NP  glipiZIDE (GLUCOTROL XL) 2.5 MG 24 hr tablet TAKE 1 TABLET BY MOUTH DAILY WITH BREAKFAST. 09/03/22   Sharon Seller, NP  ibuprofen (ADVIL) 600 MG tablet Take 1 tablet (600 mg total) by mouth every 8 (eight) hours as needed. 11/29/22   Amra Shukla, Noberto Retort, PA-C  Leuprolide Acetate (LUPRON IJ) Inject as directed every 6 (six) months.    [provider]  metFORMIN (GLUCOPHAGE) 1000 MG tablet TAKE 1 TABLET (1,000 MG TOTAL) BY MOUTH TWICE A DAY WITH FOOD 07/13/22   Sharon Seller, NP  sildenafil (VIAGRA) 100 MG tablet 0.5-1 tab 1 hour prior to intercourse 07/18/20   Stoioff, Verna Czech, MD  tamsulosin  (FLOMAX) 0.4 MG CAPS capsule Take 1 capsule (0.4 mg total) by mouth daily. 10/20/20   Riki Altes, MD    Family History Family History  Problem Relation Age of Onset   Colon cancer Mother 85   Breast cancer Mother        dx. 40s   Lung cancer Father 61   Breast cancer Sister        dx. 50s   Ovarian cancer Sister        dx. 60s   Cancer Maternal Aunt        unknown type   Cancer Maternal Uncle        unknown type   Ovarian cancer Cousin    Breast cancer Cousin 74   Esophageal cancer Neg Hx    Stomach cancer Neg Hx    Rectal cancer Neg Hx    Colon polyps Neg Hx     Social History Social History   Tobacco Use   Smoking status: Never   Smokeless tobacco: Never  Vaping Use   Vaping Use: Never used  Substance Use Topics   Alcohol use: No   Drug use: No     Allergies   Patient has no known allergies.   Review of Systems Review of Systems  Constitutional:  Negative for activity change, appetite change, fatigue and fever.  Respiratory:  Negative for cough and shortness of breath.   Cardiovascular:  Positive for chest pain (chest wall). Negative for palpitations and leg swelling.  Gastrointestinal:  Negative for abdominal pain, diarrhea, nausea and vomiting.  Neurological:  Negative for dizziness, light-headedness and headaches.     Physical Exam Triage Vital Signs ED Triage Vitals [11/29/22 1856]  Enc Vitals Group     BP (!) 147/90     Pulse Rate 66     Resp 16     Temp 98.6 F (37 C)     Temp Source Oral     SpO2 94 %     Weight      Height      Head Circumference      Peak Flow      Pain Score 5     Pain Loc      Pain Edu?      Excl. in GC?    No data found.  Updated Vital Signs BP (!) 147/90 (BP Location: Right Arm)   Pulse 66   Temp 98.6 F (37 C) (Oral)   Resp 16   SpO2 94%   Visual Acuity Right Eye Distance:   Left Eye Distance:   Bilateral Distance:    Right Eye Near:   Left Eye Near:    Bilateral Near:  Physical  Exam Vitals reviewed.  Constitutional:      General: He is awake.     Appearance: Normal appearance. He is well-developed. He is not ill-appearing.     Comments: Very pleasant male appears stated age in no acute distress sitting comfortably in exam room  HENT:     Head: Normocephalic and atraumatic.     Mouth/Throat:     Pharynx: No oropharyngeal exudate, posterior oropharyngeal erythema or uvula swelling.  Cardiovascular:     Rate and Rhythm: Normal rate and regular rhythm.     Heart sounds: Normal heart sounds, S1 normal and S2 normal. No murmur heard. Pulmonary:     Effort: Pulmonary effort is normal.     Breath sounds: Normal breath sounds. No stridor. No wheezing, rhonchi or rales.     Comments: Clear to auscultation bilaterally Chest:     Chest wall: Tenderness present. No deformity or swelling.    Abdominal:     General: Bowel sounds are normal.     Palpations: Abdomen is soft.     Tenderness: There is no abdominal tenderness. There is no right CVA tenderness, left CVA tenderness, guarding or rebound.     Comments: Benign abdominal exam  Musculoskeletal:     Cervical back: No tenderness or bony tenderness.     Thoracic back: No tenderness or bony tenderness.     Lumbar back: No tenderness or bony tenderness.  Neurological:     Mental Status: He is alert.  Psychiatric:        Behavior: Behavior is cooperative.      UC Treatments / Results  Labs (all labs ordered are listed, but only abnormal results are displayed) Labs Reviewed - No data to display  EKG   Radiology DG Ribs Unilateral W/Chest Left  Result Date: 11/29/2022 CLINICAL DATA:  Status post fall. EXAM: LEFT RIBS AND CHEST - 3+ VIEW COMPARISON:  November 26, 2020 FINDINGS: A radiopaque marker was placed at the site of the patient's pain. No fracture or other bone lesions are seen involving the ribs. There is no evidence of pneumothorax or pleural effusion. Mild linear atelectasis is seen within the bilateral  lung bases. Heart size and mediastinal contours are within normal limits. IMPRESSION: 1. No acute rib fracture. 2. Mild bibasilar linear atelectasis. Electronically Signed   By: Aram Candela M.D.   On: 11/29/2022 19:48    Procedures Procedures (including critical care time)  Medications Ordered in UC Medications - No data to display  Initial Impression / Assessment and Plan / UC Course  I have reviewed the triage vital signs and the nursing notes.  Pertinent labs & imaging results that were available during my care of the patient were reviewed by me and considered in my medical decision making (see chart for details).  Clinical Course as of 11/29/22 1956  Thu Nov 29, 2022  1951 DG Ribs Unilateral W/Chest Left [ER]    Clinical Course User Index [ER] Jeani Hawking, PA-C    Patient is well-appearing, afebrile, nontoxic, nontachycardic.  No indication for head or neck CT as patient denies any head or neck trauma.  X-ray was obtained given mechanism of injury that showed no acute abnormality but did show atelectasis.  He was given incentive spirometer with instruction to use this regularly.  He was given ibuprofen for pain relief with instruction not to take additional NSAIDs with this medication.  Can take Tylenol/acetaminophen for additional symptom relief.  He was given lidocaine patches to be  used during the day and then remove for minimum of 12 hours with only 1 patch per 24 hours in order to provide specific pain relief.  Discussed that if he has any worsening or changing symptoms including increasing pain, cough, hemoptysis, shortness of breath he should be seen immediately.  Strict return precautions given.  Final Clinical Impressions(s) / UC Diagnoses   Final diagnoses:  Rib contusion, left, initial encounter  Fall, initial encounter  Atelectasis     Discharge Instructions      Your x-ray did not show any evidence of a fracture.  It did show that the bottom of your lungs  are not completely inflated.  Please use incentive spirometer as we discussed to help manage this.  Take ibuprofen for pain.  Do not take NSAIDs with this medication including aspirin, ibuprofen/Advil, naproxen/Aleve.  Use Tylenol for breakthrough pain.  Use lidocaine patches during the day and then remove these at night; use only 1 patch per day to help with pain in that specific area.  If you have any worsening symptoms including increasing pain, shortness of breath, coughing up blood you should be seen immediately.     ED Prescriptions     Medication Sig Dispense Auth. Provider   ibuprofen (ADVIL) 600 MG tablet Take 1 tablet (600 mg total) by mouth every 8 (eight) hours as needed. 30 tablet Destenie Ingber K, PA-C   lidocaine (LIDODERM) 5 % Place 1 patch onto the skin daily. Remove & Discard patch within 12 hours or as directed by MD 30 patch Torrence Hammack K, PA-C      PDMP not reviewed this encounter.   Jeani Hawking, PA-C 11/29/22 1956

## 2022-11-29 NOTE — ED Triage Notes (Signed)
Pt presents with rib pain after a fall today.   States it hurts when he moves, states he has not taken anything for pain. Pt feels sore and uncomfortable.

## 2022-11-29 NOTE — Discharge Instructions (Addendum)
Your x-ray did not show any evidence of a fracture.  It did show that the bottom of your lungs are not completely inflated.  Please use incentive spirometer as we discussed to help manage this.  Take ibuprofen for pain.  Do not take NSAIDs with this medication including aspirin, ibuprofen/Advil, naproxen/Aleve.  Use Tylenol for breakthrough pain.  Use lidocaine patches during the day and then remove these at night; use only 1 patch per day to help with pain in that specific area.  If you have any worsening symptoms including increasing pain, shortness of breath, coughing up blood you should be seen immediately.

## 2022-12-18 ENCOUNTER — Ambulatory Visit (AMBULATORY_SURGERY_CENTER): Payer: PPO | Admitting: *Deleted

## 2022-12-18 VITALS — Ht 73.0 in | Wt 238.0 lb

## 2022-12-18 DIAGNOSIS — Z86010 Personal history of colonic polyps: Secondary | ICD-10-CM

## 2022-12-18 DIAGNOSIS — Z800 Family history of malignant neoplasm of digestive organs: Secondary | ICD-10-CM

## 2022-12-18 MED ORDER — NA SULFATE-K SULFATE-MG SULF 17.5-3.13-1.6 GM/177ML PO SOLN
1.0000 | Freq: Once | ORAL | 0 refills | Status: AC
Start: 2022-12-18 — End: 2022-12-18

## 2022-12-18 NOTE — Progress Notes (Signed)
No egg or soy allergy known to patient  No issues known to pt with past sedation with any surgeries or procedures Patient denies ever being told they had issues or difficulty with intubation  No FH of Malignant Hyperthermia Pt is not on diet pills Pt is not on  home 02  Pt is not on blood thinners  Pt denies issues with constipation  No A fib or A flutter Have any cardiac testing pending--no Pt instructed to use [email or web address] or GoodRx for a price reduction on prep    Patient's chart reviewed by Raymond [name] CNRA prior to previsit and patient appropriate for the [facility].  Previsit completed and red dot placed by patient's name on their procedure day (on provider's schedule).     Independent with mobility 

## 2022-12-24 ENCOUNTER — Encounter: Payer: Self-pay | Admitting: Nurse Practitioner

## 2022-12-24 ENCOUNTER — Ambulatory Visit (INDEPENDENT_AMBULATORY_CARE_PROVIDER_SITE_OTHER): Payer: PPO | Admitting: Nurse Practitioner

## 2022-12-24 VITALS — BP 126/84 | HR 60 | Temp 97.1°F | Ht 73.0 in | Wt 256.0 lb

## 2022-12-24 DIAGNOSIS — E785 Hyperlipidemia, unspecified: Secondary | ICD-10-CM

## 2022-12-24 DIAGNOSIS — E1142 Type 2 diabetes mellitus with diabetic polyneuropathy: Secondary | ICD-10-CM | POA: Diagnosis not present

## 2022-12-24 DIAGNOSIS — I10 Essential (primary) hypertension: Secondary | ICD-10-CM

## 2022-12-24 DIAGNOSIS — E559 Vitamin D deficiency, unspecified: Secondary | ICD-10-CM

## 2022-12-24 DIAGNOSIS — C61 Malignant neoplasm of prostate: Secondary | ICD-10-CM

## 2022-12-24 DIAGNOSIS — H6123 Impacted cerumen, bilateral: Secondary | ICD-10-CM

## 2022-12-24 LAB — CBC WITH DIFFERENTIAL/PLATELET
Basophils Absolute: 19 cells/uL (ref 0–200)
Basophils Relative: 0.6 %
Hemoglobin: 12.2 g/dL — ABNORMAL LOW (ref 13.2–17.1)
Platelets: 211 10*3/uL (ref 140–400)
Total Lymphocyte: 28.3 %

## 2022-12-24 LAB — HEMOGLOBIN A1C: Mean Plasma Glucose: 151 mg/dL

## 2022-12-24 NOTE — Patient Instructions (Signed)
To walk 10 mins after every meal

## 2022-12-24 NOTE — Progress Notes (Signed)
Careteam: Patient Care Team: Sharon Seller, NP as PCP - General (Geriatric Medicine) Mateo Flow, MD as Consulting Physician (Ophthalmology) Margaretmary Dys, MD as Consulting Physician (Radiation Oncology) Heloise Purpura, MD as Consulting Physician (Urology) Axel Filler, Larna Daughters, NP as Nurse Practitioner (Hematology and Oncology) Maryclare Labrador, RN as Registered Nurse  PLACE OF SERVICE:  [facility]  Advanced Directive information Does Patient Have a Medical Advance Directive?: No, Would patient like information on creating a medical advance directive?: Yes (MAU/Ambulatory/Procedural Areas - Information given) (Give at previous appointment)  No Known Allergies  Chief Complaint  Patient presents with   Medical Management of Chronic Issues    6 month follow up and foot exam Discuss need for covid boosters, A1c, and colonoscopy (pending for 01/16/23. Moderate fall risk. Left side still tender resulted from fall. Will see urologist 02/27/23. Podiatrist appointment pending for July, 2024     HPI: Patient is a 72 y.o. male for follow up  Had a fall, carrying a couch with another man and trying to get it up on a trailer and tripped and feel on his side which was painful. Went to UC, Had a rib contusion and still tender but improving   Colonoscopy pending- June 12th  DM- spoke with nutritionist in march- lifestyle changes are hard Reports some lows (65s at night)  Prostate cancer- continues to follow up with urologist, PSA now undetectable. Taking lupron. On flomax due to symptoms of nocturia.   Review of Systems:  Review of Systems  Constitutional:  Negative for chills, fever and weight loss.  HENT:  Negative for tinnitus.   Respiratory:  Negative for cough, sputum production and shortness of breath.   Cardiovascular:  Negative for chest pain, palpitations and leg swelling.  Gastrointestinal:  Negative for abdominal pain, constipation, diarrhea and heartburn.   Genitourinary:  Negative for dysuria, frequency and urgency.  Musculoskeletal:  Negative for back pain, falls, joint pain and myalgias.  Skin: Negative.   Neurological:  Negative for dizziness and headaches.  Psychiatric/Behavioral:  Negative for depression and memory loss. The patient does not have insomnia.     Past Medical History:  Diagnosis Date   Allergy    Cataract    child,small ones developing   Diabetes mellitus without complication (HCC)    Edema    GERD (gastroesophageal reflux disease)    Hyperlipidemia    Hypertension    Obesity, unspecified    Problems with sexual function    Prostate cancer (HCC)    Sleep apnea    released per MD, no CPAP   Unspecified sleep apnea    Unspecified vitamin D deficiency    Past Surgical History:  Procedure Laterality Date   CATARACT EXTRACTION  1964   Dr.Freed    COLONOSCOPY     HERNIA REPAIR  1987   Right Side, Dr.Kaufman    POLYPECTOMY     PROSTATE BIOPSY     radation markers     Social History:   reports that he has never smoked. He has never used smokeless tobacco. He reports that he does not drink alcohol and does not use drugs.  Family History  Problem Relation Age of Onset   Colon cancer Mother 67   Breast cancer Mother        dx. 40s   Lung cancer Father 56   Breast cancer Sister        dx. 50s   Ovarian cancer Sister  dx. 60s   Cancer Maternal Aunt        unknown type   Cancer Maternal Uncle        unknown type   Ovarian cancer Cousin    Breast cancer Cousin 74   Esophageal cancer Neg Hx    Stomach cancer Neg Hx    Rectal cancer Neg Hx    Colon polyps Neg Hx    Crohn's disease Neg Hx    Ulcerative colitis Neg Hx     Medications: Patient's Medications  New Prescriptions   No medications on file  Previous Medications   ASPIRIN 81 MG TABLET    Take 81 mg by mouth daily.   ATORVASTATIN (LIPITOR) 40 MG TABLET    TAKE 1 TABLET BY MOUTH EVERYDAY AT BEDTIME   BENZONATATE (TESSALON) 200 MG  CAPSULE    Take 200 mg by mouth 3 (three) times daily as needed.   CALCIUM CARB-CHOLECALCIFEROL (CALCIUM CARBONATE+VITAMIN D PO)    Take by mouth.   CHOLECALCIFEROL (VITAMIN D3 PO)    Take 1,000 Units by mouth daily. Take 2 caosules   CONTINUOUS BLOOD GLUC SENSOR (FREESTYLE LIBRE 3 SENSOR) MISC    PLACE 1 SENSOR ON THE SKIN EVERY 14 DAYS. USE TO CHECK GLUCOSE CONTINUOUSLY   ENALAPRIL (VASOTEC) 5 MG TABLET    TAKE 1 TABLET BY MOUTH EVERY DAY   GLIPIZIDE (GLUCOTROL XL) 2.5 MG 24 HR TABLET    TAKE 1 TABLET BY MOUTH DAILY WITH BREAKFAST.   IBUPROFEN (ADVIL) 600 MG TABLET    Take 1 tablet (600 mg total) by mouth every 8 (eight) hours as needed.   IPRATROPIUM (ATROVENT) 0.06 % NASAL SPRAY    Place into the nose.   LEUPROLIDE ACETATE (LUPRON IJ)    Inject as directed every 6 (six) months.   LIDOCAINE (LIDODERM) 5 %    Place 1 patch onto the skin daily. Remove & Discard patch within 12 hours or as directed by MD   METFORMIN (GLUCOPHAGE) 1000 MG TABLET    TAKE 1 TABLET (1,000 MG TOTAL) BY MOUTH TWICE A DAY WITH FOOD   SILDENAFIL (VIAGRA) 100 MG TABLET    0.5-1 tab 1 hour prior to intercourse   TAMSULOSIN (FLOMAX) 0.4 MG CAPS CAPSULE    Take 1 capsule (0.4 mg total) by mouth daily.  Modified Medications   No medications on file  Discontinued Medications   No medications on file    Physical Exam:  Vitals:   12/24/22 0840  BP: 126/84  Pulse: 60  Temp: (!) 97.1 F (36.2 C)  TempSrc: Temporal  SpO2: 99%  Weight: 256 lb (116.1 kg)  Height: 6\' 1"  (1.854 m)   Body mass index is 33.78 kg/m. Wt Readings from Last 3 Encounters:  12/24/22 256 lb (116.1 kg)  12/18/22 238 lb (108 kg)  06/25/22 252 lb (114.3 kg)    Physical Exam Constitutional:      General: He is not in acute distress.    Appearance: He is well-developed. He is not diaphoretic.  HENT:     Head: Normocephalic and atraumatic.     Right Ear: External ear normal. There is impacted cerumen.     Left Ear: External ear normal. There  is impacted cerumen.     Mouth/Throat:     Pharynx: No oropharyngeal exudate.  Eyes:     Conjunctiva/sclera: Conjunctivae normal.     Pupils: Pupils are equal, round, and reactive to light.  Cardiovascular:     Rate and Rhythm:  Normal rate and regular rhythm.     Heart sounds: Normal heart sounds.  Pulmonary:     Effort: Pulmonary effort is normal.     Breath sounds: Normal breath sounds.  Abdominal:     General: Bowel sounds are normal.     Palpations: Abdomen is soft.  Musculoskeletal:        General: No tenderness.     Cervical back: Normal range of motion and neck supple.     Right lower leg: No edema.     Left lower leg: No edema.  Skin:    General: Skin is warm and dry.  Neurological:     Mental Status: He is alert and oriented to person, place, and time.     Labs reviewed: Basic Metabolic Panel: Recent Labs    12/25/21 0932 06/25/22 0929  NA 137 138  K 4.6 4.3  CL 103 103  CO2 25 24  GLUCOSE 133 129*  BUN 18 20  CREATININE 1.00 0.93  CALCIUM 9.8 9.4   Liver Function Tests: Recent Labs    12/25/21 0932 06/25/22 0929  AST 22 20  ALT 32 33  BILITOT 0.7 0.7  PROT 6.8 6.9   No results for input(s): "LIPASE", "AMYLASE" in the last 8760 hours. No results for input(s): "AMMONIA" in the last 8760 hours. CBC: Recent Labs    12/25/21 0932 06/25/22 0929  WBC 3.0* 3.3*  NEUTROABS 1,710 1,838  HGB 12.7* 12.8*  HCT 38.3* 38.9  MCV 83.6 82.2  PLT 171 199   Lipid Panel: Recent Labs    12/25/21 0932 06/25/22 0929  CHOL 125 123  HDL 32* 34*  LDLCALC 68 69  TRIG 172* 122  CHOLHDL 3.9 3.6   TSH: No results for input(s): "TSH" in the last 8760 hours. A1C: Lab Results  Component Value Date   HGBA1C 7.1 (H) 06/25/2022     Assessment/Plan 1. Hyperlipidemia, unspecified hyperlipidemia type -continues on lipitor, LDL at goal on last labs  2. Essential hypertension -Blood pressure well controlled, goal bp <140/90 Continue current medications and  dietary modifications follow metabolic panel - CBC with Differential/Platelet - Complete Metabolic Panel with eGFR  3. Type 2 diabetes mellitus with diabetic polyneuropathy, without long-term current use of insulin (HCC) -Encouraged dietary compliance, routine foot care/monitoring and to keep up with diabetic eye exams through ophthalmology  -continues on metformin BID - Hemoglobin A1c - CBC with Differential/Platelet - Complete Metabolic Panel with eGFR  4. Morbid obesity (HCC) -.-education provided on healthy weight loss through increase in physical activity and proper nutrition   5. Malignant neoplasm of prostate [facility]) -ongoing follow up with urology, continues on flomax  6. Vitamin D deficiency -continues on supplement  7. Excessive cerumen in ear canal, bilateral - Ear Lavage- tolerated well, used curette with good success   Return in about 6 months (around 06/26/2023) for routine follow up.  Janene Harvey. Biagio Borg [facility] [phone]

## 2022-12-25 LAB — COMPLETE METABOLIC PANEL WITH GFR
AG Ratio: 2 (calc) (ref 1.0–2.5)
ALT: 35 U/L (ref 9–46)
AST: 22 U/L (ref 10–35)
Albumin: 4.4 g/dL (ref 3.6–5.1)
Alkaline phosphatase (APISO): 59 U/L (ref 35–144)
BUN: 19 mg/dL (ref 7–25)
CO2: 25 mmol/L (ref 20–32)
Calcium: 9.6 mg/dL (ref 8.6–10.3)
Chloride: 105 mmol/L (ref 98–110)
Creat: 0.83 mg/dL (ref 0.70–1.28)
Globulin: 2.2 g/dL (calc) (ref 1.9–3.7)
Glucose, Bld: 132 mg/dL — ABNORMAL HIGH (ref 65–99)
Potassium: 4.8 mmol/L (ref 3.5–5.3)
Sodium: 139 mmol/L (ref 135–146)
Total Bilirubin: 0.7 mg/dL (ref 0.2–1.2)
Total Protein: 6.6 g/dL (ref 6.1–8.1)
eGFR: 94 mL/min/{1.73_m2} (ref >=60)

## 2022-12-25 LAB — CBC WITH DIFFERENTIAL/PLATELET
Absolute Monocytes: 438 cells/uL (ref 200–950)
Eosinophils Absolute: 150 cells/uL (ref 15–500)
Eosinophils Relative: 4.7 %
HCT: 36.5 % — ABNORMAL LOW (ref 38.5–50.0)
Lymphs Abs: 906 cells/uL (ref 850–3900)
MCH: 27.2 pg (ref 27.0–33.0)
MCHC: 33.4 g/dL (ref 32.0–36.0)
MCV: 81.3 fL (ref 80.0–100.0)
MPV: 10.8 fL (ref 7.5–12.5)
Monocytes Relative: 13.7 %
Neutro Abs: 1686 cells/uL (ref 1500–7800)
Neutrophils Relative %: 52.7 %
RBC: 4.49 10*6/uL (ref 4.20–5.80)
RDW: 14.4 % (ref 11.0–15.0)
WBC: 3.2 10*3/uL — ABNORMAL LOW (ref 3.8–10.8)

## 2022-12-25 LAB — HEMOGLOBIN A1C
Hgb A1c MFr Bld: 6.9 % of total Hgb — ABNORMAL HIGH (ref <5.7)
eAG (mmol/L): 8.4 mmol/L

## 2023-01-07 ENCOUNTER — Encounter: Payer: Self-pay | Admitting: Gastroenterology

## 2023-01-08 ENCOUNTER — Other Ambulatory Visit: Payer: Self-pay | Admitting: Nurse Practitioner

## 2023-01-08 DIAGNOSIS — E1142 Type 2 diabetes mellitus with diabetic polyneuropathy: Secondary | ICD-10-CM

## 2023-01-10 ENCOUNTER — Other Ambulatory Visit: Payer: Self-pay | Admitting: Nurse Practitioner

## 2023-01-10 DIAGNOSIS — E785 Hyperlipidemia, unspecified: Secondary | ICD-10-CM

## 2023-01-16 ENCOUNTER — Encounter: Payer: Self-pay | Admitting: Gastroenterology

## 2023-01-16 ENCOUNTER — Ambulatory Visit (AMBULATORY_SURGERY_CENTER): Payer: PPO | Admitting: Gastroenterology

## 2023-01-16 VITALS — BP 109/71 | HR 61 | Temp 96.4°F | Resp 13 | Ht 73.0 in | Wt 238.0 lb

## 2023-01-16 DIAGNOSIS — Z09 Encounter for follow-up examination after completed treatment for conditions other than malignant neoplasm: Secondary | ICD-10-CM

## 2023-01-16 DIAGNOSIS — I10 Essential (primary) hypertension: Secondary | ICD-10-CM | POA: Diagnosis not present

## 2023-01-16 DIAGNOSIS — Z86010 Personal history of colonic polyps: Secondary | ICD-10-CM

## 2023-01-16 DIAGNOSIS — E119 Type 2 diabetes mellitus without complications: Secondary | ICD-10-CM | POA: Diagnosis not present

## 2023-01-16 DIAGNOSIS — G4733 Obstructive sleep apnea (adult) (pediatric): Secondary | ICD-10-CM | POA: Diagnosis not present

## 2023-01-16 DIAGNOSIS — Z800 Family history of malignant neoplasm of digestive organs: Secondary | ICD-10-CM | POA: Diagnosis not present

## 2023-01-16 MED ORDER — SODIUM CHLORIDE 0.9 % IV SOLN
500.0000 mL | Freq: Once | INTRAVENOUS | Status: DC
Start: 2023-01-16 — End: 2023-01-16

## 2023-01-16 NOTE — Progress Notes (Signed)
GASTROENTEROLOGY PROCEDURE H&P NOTE   Primary Care Physician: Sharon Seller, NP  HPI: Raymond [name] is a 72 y.o. male who presents for Colonoscopy for surveillance of prior TAs and SSPs.  Past Medical History:  Diagnosis Date   Allergy    Cataract    child,small ones developing   Diabetes mellitus without complication (HCC)    Edema    GERD (gastroesophageal reflux disease)    Hyperlipidemia    Hypertension    Obesity, unspecified    Problems with sexual function    Prostate cancer (HCC)    Sleep apnea    released per MD, no CPAP   Unspecified sleep apnea    Unspecified vitamin D deficiency    Past Surgical History:  Procedure Laterality Date   CATARACT EXTRACTION  1964   Dr.Freed    COLONOSCOPY     HERNIA REPAIR  1987   Right Side, Dr.Kaufman    POLYPECTOMY     PROSTATE BIOPSY     radation markers     Current Outpatient Medications  Medication Sig Dispense Refill   aspirin 81 MG tablet Take 81 mg by mouth daily.     atorvastatin (LIPITOR) 40 MG tablet TAKE 1 TABLET BY MOUTH EVERYDAY AT BEDTIME 90 tablet 1   benzonatate (TESSALON) 200 MG capsule Take 200 mg by mouth 3 (three) times daily as needed.     Calcium Carb-Cholecalciferol (CALCIUM CARBONATE+VITAMIN D PO) Take by mouth.     Cholecalciferol (VITAMIN D3 PO) Take 1,000 Units by mouth daily. Take 2 caosules     Continuous Glucose Sensor (FREESTYLE LIBRE 3 SENSOR) MISC PLACE 1 SENSOR ON THE SKIN EVERY 14 DAYS. USE TO CHECK GLUCOSE CONTINUOUSLY 2 each 3   enalapril (VASOTEC) 5 MG tablet TAKE 1 TABLET BY MOUTH EVERY DAY 90 tablet 1   glipiZIDE (GLUCOTROL XL) 2.5 MG 24 hr tablet TAKE 1 TABLET BY MOUTH DAILY WITH BREAKFAST. 90 tablet 1   ibuprofen (ADVIL) 600 MG tablet Take 1 tablet (600 mg total) by mouth every 8 (eight) hours as needed. 30 tablet 0   ipratropium (ATROVENT) 0.06 % nasal spray Place into the nose.     Leuprolide Acetate (LUPRON IJ) Inject as directed every 6 (six) months.     lidocaine  (LIDODERM) 5 % Place 1 patch onto the skin daily. Remove & Discard patch within 12 hours or as directed by MD 30 patch 0   metFORMIN (GLUCOPHAGE) 1000 MG tablet TAKE 1 TABLET (1,000 MG TOTAL) BY MOUTH TWICE A DAY WITH FOOD 180 tablet 1   sildenafil (VIAGRA) 100 MG tablet 0.5-1 tab 1 hour prior to intercourse 10 tablet 0   tamsulosin (FLOMAX) 0.4 MG CAPS capsule Take 1 capsule (0.4 mg total) by mouth daily. 30 capsule 12   No current facility-administered medications for this visit.    Current Outpatient Medications:    aspirin 81 MG tablet, Take 81 mg by mouth daily., Disp: , Rfl:    atorvastatin (LIPITOR) 40 MG tablet, TAKE 1 TABLET BY MOUTH EVERYDAY AT BEDTIME, Disp: 90 tablet, Rfl: 1   benzonatate (TESSALON) 200 MG capsule, Take 200 mg by mouth 3 (three) times daily as needed., Disp: , Rfl:    Calcium Carb-Cholecalciferol (CALCIUM CARBONATE+VITAMIN D PO), Take by mouth., Disp: , Rfl:    Cholecalciferol (VITAMIN D3 PO), Take 1,000 Units by mouth daily. Take 2 caosules, Disp: , Rfl:    Continuous Glucose Sensor (FREESTYLE LIBRE 3 SENSOR) MISC, PLACE 1 SENSOR ON THE  SKIN EVERY 14 DAYS. USE TO CHECK GLUCOSE CONTINUOUSLY, Disp: 2 each, Rfl: 3   enalapril (VASOTEC) 5 MG tablet, TAKE 1 TABLET BY MOUTH EVERY DAY, Disp: 90 tablet, Rfl: 1   glipiZIDE (GLUCOTROL XL) 2.5 MG 24 hr tablet, TAKE 1 TABLET BY MOUTH DAILY WITH BREAKFAST., Disp: 90 tablet, Rfl: 1   ibuprofen (ADVIL) 600 MG tablet, Take 1 tablet (600 mg total) by mouth every 8 (eight) hours as needed., Disp: 30 tablet, Rfl: 0   ipratropium (ATROVENT) 0.06 % nasal spray, Place into the nose., Disp: , Rfl:    Leuprolide Acetate (LUPRON IJ), Inject as directed every 6 (six) months., Disp: , Rfl:    lidocaine (LIDODERM) 5 %, Place 1 patch onto the skin daily. Remove & Discard patch within 12 hours or as directed by MD, Disp: 30 patch, Rfl: 0   metFORMIN (GLUCOPHAGE) 1000 MG tablet, TAKE 1 TABLET (1,000 MG TOTAL) BY MOUTH TWICE A DAY WITH FOOD, Disp:  180 tablet, Rfl: 1   sildenafil (VIAGRA) 100 MG tablet, 0.5-1 tab 1 hour prior to intercourse, Disp: 10 tablet, Rfl: 0   tamsulosin (FLOMAX) 0.4 MG CAPS capsule, Take 1 capsule (0.4 mg total) by mouth daily., Disp: 30 capsule, Rfl: 12 No Known Allergies Family History  Problem Relation Age of Onset   Colon cancer Mother 26   Breast cancer Mother        dx. 40s   Lung cancer Father 81   Breast cancer Sister        dx. 50s   Ovarian cancer Sister        dx. 60s   Cancer Maternal Aunt        unknown type   Cancer Maternal Uncle        unknown type   Ovarian cancer Cousin    Breast cancer Cousin 8   Esophageal cancer Neg Hx    Stomach cancer Neg Hx    Rectal cancer Neg Hx    Colon polyps Neg Hx    Crohn's disease Neg Hx    Ulcerative colitis Neg Hx    Social History   Socioeconomic History   Marital status: Married    Spouse name: Not on file   Number of children: 0   Years of education: Not on file   Highest education level: Associate degree: occupational, Scientist, product/process development, or vocational program  Occupational History   Not on file  Tobacco Use   Smoking status: Never   Smokeless tobacco: Never  Vaping Use   Vaping Use: Never used  Substance and Sexual Activity   Alcohol use: No   Drug use: No   Sexual activity: Yes  Other Topics Concern   Not on file  Social History Narrative   Not on file   Social Determinants of Health   Financial Resource Strain: Low Risk  (12/23/2022)   Overall Financial Resource Strain (CARDIA)    Difficulty of Paying Living Expenses: Not very hard  Food Insecurity: No Food Insecurity (12/23/2022)   Hunger Vital Sign    Worried About Running Out of Food in the Last Year: Never true    Ran Out of Food in the Last Year: Never true  Transportation Needs: No Transportation Needs (12/23/2022)   PRAPARE - Administrator, Civil Service (Medical): No    Lack of Transportation (Non-Medical): No  Physical Activity: Insufficiently Active  (12/23/2022)   Exercise Vital Sign    Days of Exercise per Week: 2 days  Minutes of Exercise per Session: 30 min  Stress: No Stress Concern Present (12/23/2022)   [employer] of Occupational Health - Occupational Stress Questionnaire    Feeling of Stress : Not at all  Social Connections: Moderately Integrated (12/23/2022)   Social Connection and Isolation Panel [NHANES]    Frequency of Communication with Friends and Family: Once a week    Frequency of Social Gatherings with Friends and Family: Once a week    Attends Religious Services: More than 4 times per year    Active Member of [employer] or Organizations: Yes    Attends Banker Meetings: More than 4 times per year    Marital Status: Married  Catering manager Violence: Not At Risk (07/10/2021)   Humiliation, Afraid, Rape, and Kick questionnaire    Fear of Current or Ex-Partner: No    Emotionally Abused: No    Physically Abused: No    Sexually Abused: No    Physical Exam: There were no vitals filed for this visit. There is no height or weight on file to calculate BMI. GEN: NAD EYE: Sclerae anicteric ENT: MMM CV: Non-tachycardic GI: Soft, NT/ND NEURO:  Alert & Oriented x 3  Lab Results: No results for input(s): "WBC", "HGB", "HCT", "PLT" in the last 72 hours. BMET No results for input(s): "NA", "K", "CL", "CO2", "GLUCOSE", "BUN", "CREATININE", "CALCIUM" in the last 72 hours. LFT No results for input(s): "PROT", "ALBUMIN", "AST", "ALT", "ALKPHOS", "BILITOT", "BILIDIR", "IBILI" in the last 72 hours. PT/INR No results for input(s): "LABPROT", "INR" in the last 72 hours.   Impression / Plan: This is a 72 y.o.male who presents for Colonoscopy for surveillance of prior TAs and SSPs.  The risks and benefits of endoscopic evaluation/treatment were discussed with the patient and/or family; these include but are not limited to the risk of perforation, infection, bleeding, missed lesions, lack of diagnosis, severe  illness requiring hospitalization, as well as anesthesia and sedation related illnesses.  The patient's history has been reviewed, patient examined, no change in status, and deemed stable for procedure.  The patient and/or family is agreeable to proceed.    Corliss Parish, MD [facility] Advanced Endoscopy Office # [phone]

## 2023-01-16 NOTE — Patient Instructions (Addendum)
Recommendation:           - The patient will be observed post-procedure,                            until all discharge criteria are met.                           - Discharge patient to home.                           - Patient has a contact number available for                            emergencies. The signs and symptoms of potential                            delayed complications were discussed with the                            patient. Return to normal activities tomorrow.                            Written discharge instructions were provided to the                            patient.                           - High fiber diet.                           - Use FiberCon 1-2 tablets PO daily.                           - Continue present medications.                           - Repeat colonoscopy in 7 years for surveillance                            due to previous history of adenomatous and serrated                            adenomatous polyps. This will be depending on your                            health at the time as he will be 72 years of age.                           - The findings and recommendations were discussed                            with the patient.                           -  The findings and recommendations were discussed                            with the patient's family.  Handouts on hemorrhoids, diverticulosis and high fiber diet given.   YOU HAD AN ENDOSCOPIC PROCEDURE TODAY AT THE [facility]:   Refer to the procedure report that was given to you for any specific questions about what was found during the examination.  If the procedure report does not answer your questions, please call your gastroenterologist to clarify.  If you requested that your care partner not be given the details of your procedure findings, then the procedure report has been included in a sealed envelope for you to review at your convenience later.  YOU SHOULD EXPECT:  Some feelings of bloating in the abdomen. Passage of more gas than usual.  Walking can help get rid of the air that was put into your GI tract during the procedure and reduce the bloating. If you had a lower endoscopy (such as a colonoscopy or flexible sigmoidoscopy) you may notice spotting of blood in your stool or on the toilet paper. If you underwent a bowel prep for your procedure, you may not have a normal bowel movement for a few days.  Please Note:  You might notice some irritation and congestion in your nose or some drainage.  This is from the oxygen used during your procedure.  There is no need for concern and it should clear up in a day or so.  SYMPTOMS TO REPORT IMMEDIATELY:  Following lower endoscopy (colonoscopy or flexible sigmoidoscopy):  Excessive amounts of blood in the stool  Significant tenderness or worsening of abdominal pains  Swelling of the abdomen that is new, acute  Fever of 100F or higher  For urgent or emergent issues, a gastroenterologist can be reached at any hour by calling [phone]. Do not use MyChart messaging for urgent concerns.    DIET:  We do recommend a small meal at first, but then you may proceed to your regular diet.  Drink plenty of fluids but you should avoid alcoholic beverages for 24 hours.  ACTIVITY:  You should plan to take it easy for the rest of today and you should NOT DRIVE or use heavy machinery until tomorrow (because of the sedation medicines used during the test).    FOLLOW UP: Our staff will call the number listed on your records the next business day following your procedure.  We will call around 7:15- 8:00 am to check on you and address any questions or concerns that you may have regarding the information given to you following your procedure. If we do not reach you, we will leave a message.     If any biopsies were taken you will be contacted by phone or by letter within the next 1-3 weeks.  Please call us at [phone] if  you have not heard about the biopsies in 3 weeks.    SIGNATURES/CONFIDENTIALITY: You and/or your care partner have signed paperwork which will be entered into your electronic medical record.  These signatures attest to the fact that that the information above on your After Visit Summary has been reviewed and is understood.  Full responsibility of the confidentiality of this discharge information lies with you and/or your care-partner.

## 2023-01-16 NOTE — Progress Notes (Signed)
Pt's states no medical or surgical changes since previsit or office visit. 

## 2023-01-16 NOTE — Op Note (Signed)
[facility] Patient Name: Raymond [name] Procedure Date: 01/16/2023 9:47 AM MRN: [id number] Endoscopist: Corliss Parish , MD, [id number] Age: 72 Referring MD:  Date of Birth: 1950/09/30 Gender: Male Account #: [id number] Procedure:                Colonoscopy Indications:              Surveillance: Personal history of adenomatous                            polyps on last colonoscopy > 3 years ago Medicines:                Monitored Anesthesia Care Procedure:                Pre-Anesthesia Assessment:                           - Prior to the procedure, a History and Physical                            was performed, and patient medications and                            allergies were reviewed. The patient's tolerance of                            previous anesthesia was also reviewed. The risks                            and benefits of the procedure and the sedation                            options and risks were discussed with the patient.                            All questions were answered, and informed consent                            was obtained. Prior Anticoagulants: The patient has                            taken no anticoagulant or antiplatelet agents                            except for aspirin. ASA Grade Assessment: II - A                            patient with mild systemic disease. After reviewing                            the risks and benefits, the patient was deemed in                            satisfactory condition to undergo the procedure.  After obtaining informed consent, the colonoscope                            was passed under direct vision. Throughout the                            procedure, the patient's blood pressure, pulse, and                            oxygen saturations were monitored continuously. The                            CF [id number] #[id number] was introduced through the anus                             and advanced to the 3 cm into the ileum. The                            colonoscopy was performed without difficulty. The                            patient tolerated the procedure. The quality of the                            bowel preparation was good. The terminal ileum,                            ileocecal valve, appendiceal orifice, and rectum                            were photographed. Scope In: 9:53:31 AM Scope Out: 10:05:04 AM Scope Withdrawal Time: 0 hours 8 minutes 55 seconds  Total Procedure Duration: 0 hours 11 minutes 33 seconds  Findings:                 Skin tags were found on perianal exam.                           The digital rectal exam findings include                            hemorrhoids. Pertinent negatives include no                            palpable rectal lesions.                           The terminal ileum and ileocecal valve appeared                            normal.                           A few small-mouthed diverticula were found in the  recto-sigmoid colon and sigmoid colon.                           Normal mucosa was found in the entire colon                            otherwise.                           Anal papilla was hypertrophied.                           Non-bleeding non-thrombosed external and internal                            hemorrhoids were found during retroflexion, during                            perianal exam and during digital exam. The                            hemorrhoids were Grade II (internal hemorrhoids                            that prolapse but reduce spontaneously). Complications:            No immediate complications. Estimated Blood Loss:     Estimated blood loss was minimal. Impression:               - Perianal skin tags found on perianal exam.                           - Hemorrhoids found on digital rectal exam.                           - The examined portion of the ileum was  normal.                           - Diverticulosis in the recto-sigmoid colon and in                            the sigmoid colon.                           - Normal mucosa in the entire examined colon                            otherwise.                           - Anal papilla was hypertrophied.                           - Non-bleeding non-thrombosed external and internal                            hemorrhoids. Recommendation:           -  The patient will be observed post-procedure,                            until all discharge criteria are met.                           - Discharge patient to home.                           - Patient has a contact number available for                            emergencies. The signs and symptoms of potential                            delayed complications were discussed with the                            patient. Return to normal activities tomorrow.                            Written discharge instructions were provided to the                            patient.                           - High fiber diet.                           - Use FiberCon 1-2 tablets PO daily.                           - Continue present medications.                           - Repeat colonoscopy in 7 years for surveillance                            due to previous history of adenomatous and serrated                            adenomatous polyps. This will be depending on your                            health at the time as he will be 72 years of age.                           - The findings and recommendations were discussed                            with the patient.                           - The findings and recommendations were discussed  with the patient's family. Corliss Parish, MD 01/16/2023 10:09:23 AM

## 2023-01-16 NOTE — Progress Notes (Signed)
Report to PACU, RN, vss, BBS= Clear.  

## 2023-01-17 ENCOUNTER — Telehealth: Payer: Self-pay

## 2023-01-17 NOTE — Telephone Encounter (Signed)
Follow up call to pt, lm for pt to call if having any difficulty with normal activities or eating and drinking.  Also to call if any other questions or concerns.  

## 2023-01-28 ENCOUNTER — Ambulatory Visit: Payer: PPO | Admitting: Registered"

## 2023-02-14 ENCOUNTER — Encounter: Payer: Self-pay | Admitting: Podiatry

## 2023-02-14 ENCOUNTER — Ambulatory Visit: Payer: PPO | Admitting: Podiatry

## 2023-02-14 VITALS — BP 132/74 | HR 70

## 2023-02-14 DIAGNOSIS — B351 Tinea unguium: Secondary | ICD-10-CM

## 2023-02-14 DIAGNOSIS — M79675 Pain in left toe(s): Secondary | ICD-10-CM | POA: Diagnosis not present

## 2023-02-14 DIAGNOSIS — E1165 Type 2 diabetes mellitus with hyperglycemia: Secondary | ICD-10-CM | POA: Diagnosis not present

## 2023-02-14 DIAGNOSIS — M79674 Pain in right toe(s): Secondary | ICD-10-CM

## 2023-02-14 NOTE — Progress Notes (Signed)
  Subjective:  Patient ID: Raymond [name], male    DOB: Mar 03, 1951,  MRN: [id number]  Raymond [name] presents to clinic today for: preventative diabetic foot care and painful elongated mycotic toenails 1-5 bilaterally which are tender when wearing enclosed shoe gear. Pain is relieved with periodic professional debridement.  Patient relates h/o injury to left great toe years ago when he was working.  Chief Complaint  Patient presents with   Nail Problem    "My two big toes have thickened up.  Usually I need more work on the left one."    PCP is Sharon Seller, NP.  No Known Allergies  Review of Systems: Negative except as noted in the HPI.  Objective: No changes noted in today's physical examination. Vitals:   02/14/23 1117  BP: 132/74  Pulse: 70   Raymond [name] is a pleasant 72 y.o. male in NAD. AAO x 3.  Vascular Examination: Capillary refill time <3 seconds b/l LE. Palpable pedal pulses b/l LE. Digital hair present b/l. No pedal edema b/l. Skin temperature gradient WNL b/l. No varicosities b/l. No edema noted b/l LE.Marland Kitchen  Dermatological Examination: Pedal skin with normal turgor, texture and tone b/l. No open wounds. No interdigital macerations b/l. Toenails 1-5 b/l thickened, discolored, dystrophic with subungual debris. There is pain on palpation to dorsal aspect of nailplates. No hyperkeratotic nor porokeratotic lesions present on today's visit.Marland Kitchen  Neurological Examination: Protective sensation intact with 10 gram monofilament b/l LE. Vibratory sensation intact b/l LE.   Musculoskeletal Examination: Normal muscle strength 5/5 to all lower extremity muscle groups bilaterally. No pain, crepitus or joint limitation noted with ROM b/l LE. No gross bony pedal deformities b/l. Patient ambulates independently without assistive aids.     Latest Ref Rng & Units 12/24/2022    9:22 AM 06/25/2022    9:29 AM  Hemoglobin A1C  Hemoglobin-A1c <5.7 % of total Hgb 6.9  7.1     Assessment/Plan: 1. Pain due to onychomycosis of toenails of both feet   2. Type 2 diabetes mellitus with hyperglycemia, without long-term current use of insulin (HCC)    -Patient was evaluated and treated. All patient's and/or POA's questions/concerns answered on today's visit. -Continue foot and shoe inspections daily. Monitor blood glucose per PCP/Endocrinologist's recommendations. -Patient to continue soft, supportive shoe gear daily. -Toenails 1-5 b/l were debrided in length and girth with sterile nail nippers and dremel without iatrogenic bleeding.  -Patient/POA to call should there be question/concern in the interim.   Return in about 4 months (around 06/17/2023).  Freddie Breech, DPM

## 2023-02-27 DIAGNOSIS — C61 Malignant neoplasm of prostate: Secondary | ICD-10-CM | POA: Diagnosis not present

## 2023-03-01 ENCOUNTER — Other Ambulatory Visit: Payer: Self-pay | Admitting: Nurse Practitioner

## 2023-03-01 NOTE — Telephone Encounter (Signed)
Patient has request refill on medication glipizide. Medication has allergy contraindications. Medication pend and sent to PCP Janyth Contes Janene Harvey, NP

## 2023-03-06 DIAGNOSIS — C61 Malignant neoplasm of prostate: Secondary | ICD-10-CM | POA: Diagnosis not present

## 2023-04-17 ENCOUNTER — Other Ambulatory Visit: Payer: Self-pay | Admitting: Nurse Practitioner

## 2023-04-17 DIAGNOSIS — E1142 Type 2 diabetes mellitus with diabetic polyneuropathy: Secondary | ICD-10-CM

## 2023-04-29 ENCOUNTER — Encounter: Payer: Self-pay | Admitting: Nurse Practitioner

## 2023-04-29 ENCOUNTER — Ambulatory Visit (INDEPENDENT_AMBULATORY_CARE_PROVIDER_SITE_OTHER): Payer: PPO | Admitting: Nurse Practitioner

## 2023-04-29 VITALS — BP 118/80 | HR 77 | Temp 97.9°F | Ht 73.0 in | Wt 251.0 lb

## 2023-04-29 DIAGNOSIS — Z0000 Encounter for general adult medical examination without abnormal findings: Secondary | ICD-10-CM | POA: Diagnosis not present

## 2023-04-29 DIAGNOSIS — Z23 Encounter for immunization: Secondary | ICD-10-CM | POA: Diagnosis not present

## 2023-04-29 MED ORDER — UNABLE TO FIND: 11 refills | Status: AC

## 2023-04-29 NOTE — Patient Instructions (Signed)
  Raymond [name] , Thank you for taking time to come for your Medicare Wellness Visit. I appreciate your ongoing commitment to your health goals. Please review the following plan we discussed and let me know if I can assist you in the future.   These are the goals we discussed:  Goals      Weight (lb) < 225 lb (102.1 kg)        This is a list of the screening recommended for you and due dates:  Health Maintenance  Topic Date Due   Zoster (Shingles) Vaccine (2 of 2) 01/03/2023   Flu Shot  03/07/2023   COVID-19 Vaccine (6 - 2023-24 season) 04/07/2023   Yearly kidney health urinalysis for diabetes  06/26/2023   Hemoglobin A1C  06/26/2023   Eye exam for diabetics  09/21/2023   DTaP/Tdap/Td vaccine (3 - Td or Tdap) 12/02/2023   Yearly kidney function blood test for diabetes  12/24/2023   Complete foot exam   12/24/2023   Medicare Annual Wellness Visit  04/28/2024   Colon Cancer Screening  01/15/2026   Pneumonia Vaccine  Completed   Hepatitis C Screening  Completed   HPV Vaccine  Aged Out   SHINGLES vaccine and covid booster to get at local pharmacy

## 2023-04-29 NOTE — Progress Notes (Signed)
Subjective:   Raymond [name] is a 72 y.o. male who presents for Medicare Annual/Subsequent preventive examination.  Visit Complete: In person   Cardiac Risk Factors include: advanced age (>28men, >41 women);obesity (BMI >30kg/m2);hypertension;diabetes mellitus;dyslipidemia;male gender     Objective:    Today's Vitals   04/29/23 1035  BP: 118/80  Pulse: 77  Temp: 97.9 F (36.6 C)  TempSrc: Temporal  SpO2: 97%  Weight: 251 lb (113.9 kg)  Height: 6\' 1"  (1.854 m)   Body mass index is 33.12 kg/m.     04/29/2023   10:37 AM 12/24/2022    8:53 AM 06/25/2022    9:09 AM 04/19/2022    9:54 AM 12/25/2021    9:09 AM 09/29/2021   11:29 AM 06/23/2021    9:31 AM  Advanced Directives  Does Patient Have a Medical Advance Directive? No No No No No No No  Does patient want to make changes to medical advance directive?      Yes (MAU/Ambulatory/Procedural Areas - Information given)   Would patient like information on creating a medical advance directive? Yes (MAU/Ambulatory/Procedural Areas - Information given) Yes (MAU/Ambulatory/Procedural Areas - Information given) Yes (MAU/Ambulatory/Procedural Areas - Information given) Yes (MAU/Ambulatory/Procedural Areas - Information given) Yes (MAU/Ambulatory/Procedural Areas - Information given)  No - Patient declined    Current Medications (verified) Outpatient Encounter Medications as of 04/29/2023  Medication Sig   aspirin 81 MG tablet Take 81 mg by mouth daily.   atorvastatin (LIPITOR) 40 MG tablet TAKE 1 TABLET BY MOUTH EVERYDAY AT BEDTIME   benzonatate (TESSALON) 200 MG capsule Take 200 mg by mouth 3 (three) times daily as needed.   Calcium Carb-Cholecalciferol (CALCIUM CARBONATE+VITAMIN D PO) Take by mouth.   Cholecalciferol (VITAMIN D3 PO) Take 2,000 Units by mouth daily.   enalapril (VASOTEC) 5 MG tablet TAKE 1 TABLET BY MOUTH EVERY DAY   glipiZIDE (GLUCOTROL XL) 2.5 MG 24 hr tablet TAKE 1 TABLET BY MOUTH EVERY DAY WITH BREAKFAST    ibuprofen (ADVIL) 600 MG tablet Take 1 tablet (600 mg total) by mouth every 8 (eight) hours as needed.   ipratropium (ATROVENT) 0.06 % nasal spray Place 1 spray into the nose as needed.   metFORMIN (GLUCOPHAGE) 1000 MG tablet TAKE 1 TABLET (1,000 MG TOTAL) BY MOUTH TWICE A DAY WITH FOOD   sildenafil (VIAGRA) 100 MG tablet 0.5-1 tab 1 hour prior to intercourse   tamsulosin (FLOMAX) 0.4 MG CAPS capsule Take 1 capsule (0.4 mg total) by mouth daily.   UNABLE TO FIND Med Name: Freestyle Libra Plus check blood sugar three times daily   [DISCONTINUED] Continuous Glucose Sensor (FREESTYLE LIBRE 3 SENSOR) MISC PLACE 1 SENSOR ON THE SKIN EVERY 14 DAYS. USE TO CHECK GLUCOSE CONTINUOUSLY (Patient not taking: Reported on 04/29/2023)   [DISCONTINUED] Leuprolide Acetate (LUPRON IJ) Inject as directed every 6 (six) months.   [DISCONTINUED] lidocaine (LIDODERM) 5 % Place 1 patch onto the skin daily. Remove & Discard patch within 12 hours or as directed by MD   No facility-administered encounter medications on file as of 04/29/2023.    Allergies (verified) Patient has no known allergies.   History: Past Medical History:  Diagnosis Date   Allergy    Cataract    child,small ones developing   Diabetes mellitus without complication (HCC)    Edema    GERD (gastroesophageal reflux disease)    Hyperlipidemia    Hypertension    Obesity, unspecified    Problems with sexual function    Prostate cancer (  HCC)    Sleep apnea    released per MD, no CPAP   Unspecified sleep apnea    Unspecified vitamin D deficiency    Past Surgical History:  Procedure Laterality Date   CATARACT EXTRACTION  1964   Dr.Freed    COLONOSCOPY     HERNIA REPAIR  1987   Right Side, Dr.Kaufman    POLYPECTOMY     PROSTATE BIOPSY     radation markers     Family History  Problem Relation Age of Onset   Colon cancer Mother 6   Breast cancer Mother        dx. 40s   Lung cancer Father 43   Breast cancer Sister        dx. 50s    Ovarian cancer Sister        dx. 60s   Cancer Maternal Aunt        unknown type   Cancer Maternal Uncle        unknown type   Ovarian cancer Cousin    Breast cancer Cousin 50   Esophageal cancer Neg Hx    Stomach cancer Neg Hx    Rectal cancer Neg Hx    Colon polyps Neg Hx    Crohn's disease Neg Hx    Ulcerative colitis Neg Hx    Social History   Socioeconomic History   Marital status: Married    Spouse name: Not on file   Number of children: 0   Years of education: Not on file   Highest education level: Associate degree: occupational, Scientist, product/process development, or vocational program  Occupational History   Not on file  Tobacco Use   Smoking status: Never   Smokeless tobacco: Never  Vaping Use   Vaping status: Never Used  Substance and Sexual Activity   Alcohol use: No   Drug use: No   Sexual activity: Yes  Other Topics Concern   Not on file  Social History Narrative   Not on file   Social Determinants of Health   Financial Resource Strain: Low Risk  (12/23/2022)   Overall Financial Resource Strain (CARDIA)    Difficulty of Paying Living Expenses: Not very hard  Food Insecurity: No Food Insecurity (12/23/2022)   Hunger Vital Sign    Worried About Running Out of Food in the Last Year: Never true    Ran Out of Food in the Last Year: Never true  Transportation Needs: No Transportation Needs (12/23/2022)   PRAPARE - Administrator, Civil Service (Medical): No    Lack of Transportation (Non-Medical): No  Physical Activity: Insufficiently Active (12/23/2022)   Exercise Vital Sign    Days of Exercise per Week: 2 days    Minutes of Exercise per Session: 30 min  Stress: No Stress Concern Present (12/23/2022)   [employer] of Occupational Health - Occupational Stress Questionnaire    Feeling of Stress : Not at all  Social Connections: Moderately Integrated (12/23/2022)   Social Connection and Isolation Panel [NHANES]    Frequency of Communication with Friends and Family:  Once a week    Frequency of Social Gatherings with Friends and Family: Once a week    Attends Religious Services: More than 4 times per year    Active Member of [employer] or Organizations: Yes    Attends Engineer, structural: More than 4 times per year    Marital Status: Married    Tobacco Counseling Counseling given: Not Answered   Clinical Intake:  Pre-visit preparation completed: Yes  Pain : No/denies pain     BMI - recorded: 33 Nutritional Status: BMI > 30  Obese  How often do you need to have someone help you when you read instructions, pamphlets, or other written materials from your doctor or pharmacy?: 1 - Never         Activities of Daily Living    04/29/2023   11:00 AM  In your present state of health, do you have any difficulty performing the following activities:  Hearing? 0  Vision? 0  Difficulty concentrating or making decisions? 0  Walking or climbing stairs? 0  Dressing or bathing? 0  Doing errands, shopping? 0  Preparing Food and eating ? N  Using the Toilet? N  In the past six months, have you accidently leaked urine? N  Do you have problems with loss of bowel control? N  Managing your Medications? N  Managing your Finances? N  Housekeeping or managing your Housekeeping? N    Patient Care Team: Sharon Seller, NP as PCP - General (Geriatric Medicine) Mateo Flow, MD as Consulting Physician (Ophthalmology) Margaretmary Dys, MD as Consulting Physician (Radiation Oncology) Heloise Purpura, MD as Consulting Physician (Urology) Axel Filler, Larna Daughters, NP as Nurse Practitioner (Hematology and Oncology) Maryclare Labrador, RN as Registered Nurse  Indicate any recent Medical Services you may have received from other than [facility] providers in the past year (date may be approximate).     Assessment:   This is a routine wellness examination for [facility].  Hearing/Vision screen Hearing Screening - Comments:: No hearing issues  Vision Screening  - Comments:: Last eye exam less than 12 months ago, with [facility], Dr.Van   Goals Addressed   None    Depression Screen    04/29/2023   10:36 AM 12/24/2022    8:51 AM 06/25/2022    9:09 AM 04/19/2022    9:53 AM 12/25/2021    9:08 AM 09/29/2021   11:30 AM 07/10/2021   10:56 AM  PHQ 2/9 Scores  PHQ - 2 Score 0 0 0 0 0 0 0    Fall Risk    04/29/2023   10:35 AM 12/24/2022    8:49 AM 06/25/2022    9:09 AM 04/19/2022    9:53 AM 12/25/2021    9:08 AM  Fall Risk   Falls in the past year? 1 1 0 0 0  Number falls in past yr: 0 0 0 0 0  Injury with Fall? 1 1 0 0 0  Comment Rib injury Injured left side     Risk for fall due to : History of fall(s) History of fall(s) No Fall Risks No Fall Risks No Fall Risks  Follow up Falls evaluation completed Falls evaluation completed Falls evaluation completed Falls evaluation completed Falls evaluation completed    MEDICARE RISK AT HOME:    TIMED UP AND GO:  Was the test performed?  No    Cognitive Function:    04/29/2023   10:46 AM 12/25/2019    9:49 AM 01/27/2018   11:16 AM  MMSE - Mini Mental State Exam  Orientation to time 5 5 5   Orientation to Place 5 5 5   Registration 3 3 3   Attention/ Calculation 5 5 5   Recall 2 2 3   Language- name 2 objects 2 2 2   Language- repeat 1 1 1   Language- follow 3 step command 3 3 3   Language- read & follow direction 1 1 1   Write a sentence  1 1 1   Copy design 1 1 1   Total score 29 29 30         04/19/2022    9:54 AM 04/13/2021    2:22 PM 12/15/2018    8:48 AM  6CIT Screen  What Year? 0 points 0 points 0 points  What month? 0 points 0 points 0 points  What time? 0 points 0 points 0 points  Count back from 20 0 points 0 points 0 points  Months in reverse 0 points 0 points 0 points  Repeat phrase 2 points 2 points 0 points  Total Score 2 points 2 points 0 points    Immunizations Immunization History  Administered Date(s) Administered   Fluad Quad(high Dose 65+) 06/22/2019, 06/27/2020,  06/23/2021, 06/09/2022   Fluad Trivalent(High Dose 65+) 04/29/2023   Influenza, High Dose Seasonal PF 08/26/2017   Influenza,inj,Quad PF,6+ Mos 04/01/2013, 07/12/2014, 05/26/2015, 04/17/2016, 05/30/2018   PFIZER(Purple Top)SARS-COV-2 Vaccination 10/02/2019, 10/30/2019, 05/31/2020   Pfizer Covid-19 Vaccine Bivalent Booster 62yrs & up 05/29/2021   Pfizer(Comirnaty)Fall Seasonal Vaccine 12 years and older 06/09/2022   Pneumococcal Conjugate-13 07/19/2016   Pneumococcal Polysaccharide-23 09/20/2011, 01/27/2018   Td 08/06/2002   Tdap 12/01/2013   Zoster Recombinant(Shingrix) 11/08/2022   Zoster, Live 09/16/2012    TDAP status: Up to date  Flu Vaccine status: Completed at today's visit  Pneumococcal vaccine status: Up to date  Covid-19 vaccine status: Information provided on how to obtain vaccines.   Qualifies for Shingles Vaccine? Yes   Zostavax completed No   Shingrix Completed?: No.    Education has been provided regarding the importance of this vaccine. Patient has been advised to call insurance company to determine out of pocket expense if they have not yet received this vaccine. Advised may also receive vaccine at local pharmacy or Health Dept. Verbalized acceptance and understanding.  Screening Tests Health Maintenance  Topic Date Due   Zoster Vaccines- Shingrix (2 of 2) 01/03/2023   COVID-19 Vaccine (6 - 2023-24 season) 04/07/2023   Diabetic kidney evaluation - Urine ACR  06/26/2023   HEMOGLOBIN A1C  06/26/2023   OPHTHALMOLOGY EXAM  09/21/2023   DTaP/Tdap/Td (3 - Td or Tdap) 12/02/2023   Diabetic kidney evaluation - eGFR measurement  12/24/2023   FOOT EXAM  12/24/2023   Medicare Annual Wellness (AWV)  04/28/2024   Colonoscopy  01/15/2026   Pneumonia Vaccine 42+ Years old  Completed   INFLUENZA VACCINE  Completed   Hepatitis C Screening  Completed   HPV VACCINES  Aged Out    Health Maintenance  Health Maintenance Due  Topic Date Due   Zoster Vaccines- Shingrix (2 of  2) 01/03/2023   COVID-19 Vaccine (6 - 2023-24 season) 04/07/2023    Colorectal cancer screening: Type of screening: Colonoscopy. Completed 2024. Repeat every 3 years  Lung Cancer Screening: (Low Dose CT Chest recommended if Age 33-80 years, 20 pack-year currently smoking OR have quit w/in 15years.) does not qualify.   Lung Cancer Screening Referral: na  Additional Screening:  Hepatitis C Screening: does qualify; Completed   Vision Screening: Recommended annual ophthalmology exams for early detection of glaucoma and other disorders of the eye. Is the patient up to date with their annual eye exam?  Yes  Who is the provider or what is the name of the office in which the patient attends annual eye exams? [facility] If pt is not established with a provider, would they like to be referred to a provider to establish care? No .   Dental Screening:  Recommended annual dental exams for proper oral hygiene  Diabetic Foot Exam: Diabetic Foot Exam: Completed    Community Resource Referral / Chronic Care Management: CRR required this visit?  No   CCM required this visit?  No     Plan:     I have personally reviewed and noted the following in the patient's chart:   Medical and social history Use of alcohol, tobacco or illicit drugs  Current medications and supplements including opioid prescriptions. Patient is not currently taking opioid prescriptions. Functional ability and status Nutritional status Physical activity Advanced directives List of other physicians Hospitalizations, surgeries, and ER visits in previous 12 months Vitals Screenings to include cognitive, depression, and falls Referrals and appointments  In addition, I have reviewed and discussed with patient certain preventive protocols, quality metrics, and best practice recommendations. A written personalized care plan for preventive services as well as general preventive health recommendations were provided to patient.      Sharon Seller, NP   04/29/2023

## 2023-05-20 ENCOUNTER — Ambulatory Visit: Payer: PPO | Admitting: Podiatry

## 2023-05-20 ENCOUNTER — Encounter: Payer: Self-pay | Admitting: Podiatry

## 2023-05-20 DIAGNOSIS — M79674 Pain in right toe(s): Secondary | ICD-10-CM | POA: Diagnosis not present

## 2023-05-20 DIAGNOSIS — M79675 Pain in left toe(s): Secondary | ICD-10-CM

## 2023-05-20 DIAGNOSIS — B351 Tinea unguium: Secondary | ICD-10-CM | POA: Diagnosis not present

## 2023-05-20 DIAGNOSIS — E1165 Type 2 diabetes mellitus with hyperglycemia: Secondary | ICD-10-CM

## 2023-05-27 NOTE — Progress Notes (Signed)
Subjective:  Patient ID: Raymond [name], male    DOB: 12/14/1950,  MRN: [id number]  72 y.o. male presents preventative diabetic foot care and painful thick toenails that are difficult to trim. Pain interferes with ambulation. Aggravating factors include wearing enclosed shoe gear. Pain is relieved with periodic professional debridement.  Chief Complaint  Patient presents with   Diabetes    BS- A1C-6.9 PCPV-   New problem(s): None   PCP is Sharon Seller, NP , and last visit was April 29, 2023.  No Known Allergies  Review of Systems: Negative except as noted in the HPI.   Objective:  Raymond [name] is a pleasant 72 y.o. male in NAD.Marland Kitchen AAO x 3.  Vascular Examination: Vascular status intact b/l with palpable pedal pulses. CFT immediate b/l. Pedal hair present. No edema. No pain with calf compression b/l. Skin temperature gradient WNL b/l. No varicosities noted. No cyanosis or clubbing noted.  Neurological Examination: Sensation grossly intact b/l with 10 gram monofilament. Vibratory sensation intact b/l.  Dermatological Examination: Pedal skin with normal turgor, texture and tone b/l. No open wounds nor interdigital macerations noted. Toenails 1-5 b/l thick, discolored, elongated with subungual debris and pain on dorsal palpation. No hyperkeratotic lesions noted b/l.   Musculoskeletal Examination: Muscle strength 5/5 to b/l LE.  No pain, crepitus noted b/l. No gross pedal deformities. Patient ambulates independently without assistive aids.   Radiographs: None Last A1c:      Latest Ref Rng & Units 12/24/2022    9:22 AM 06/25/2022    9:29 AM  Hemoglobin A1C  Hemoglobin-A1c <5.7 % of total Hgb 6.9  7.1      Assessment:   1. Pain due to onychomycosis of toenails of both feet   2. Type 2 diabetes mellitus with hyperglycemia, without long-term current use of insulin (HCC)    Plan:  -Consent given for treatment as described below: -Examined patient. -Continue foot and  shoe inspections daily. Monitor blood glucose per PCP/Endocrinologist's recommendations. -Continue supportive shoe gear daily. -Mycotic toenails 1-5 bilaterally were debrided in length and girth with sterile nail nippers and dremel without incident. -Patient/POA to call should there be question/concern in the interim.  Return in about 3 months (around 08/20/2023).  Freddie Breech, DPM

## 2023-07-01 ENCOUNTER — Encounter: Payer: Self-pay | Admitting: Nurse Practitioner

## 2023-07-01 ENCOUNTER — Ambulatory Visit (INDEPENDENT_AMBULATORY_CARE_PROVIDER_SITE_OTHER): Payer: PPO | Admitting: Nurse Practitioner

## 2023-07-01 VITALS — BP 126/80 | HR 58 | Temp 97.7°F | Ht 73.0 in | Wt 247.0 lb

## 2023-07-01 DIAGNOSIS — I10 Essential (primary) hypertension: Secondary | ICD-10-CM | POA: Diagnosis not present

## 2023-07-01 DIAGNOSIS — C61 Malignant neoplasm of prostate: Secondary | ICD-10-CM | POA: Diagnosis not present

## 2023-07-01 DIAGNOSIS — E785 Hyperlipidemia, unspecified: Secondary | ICD-10-CM

## 2023-07-01 DIAGNOSIS — E1142 Type 2 diabetes mellitus with diabetic polyneuropathy: Secondary | ICD-10-CM

## 2023-07-01 NOTE — Progress Notes (Signed)
Careteam: Patient Care Team: Sharon Seller, NP as PCP - General (Geriatric Medicine) Mateo Flow, MD as Consulting Physician (Ophthalmology) Margaretmary Dys, MD as Consulting Physician (Radiation Oncology) Heloise Purpura, MD as Consulting Physician (Urology) Axel Filler, Larna Daughters, NP as Nurse Practitioner (Hematology and Oncology) Maryclare Labrador, RN as Registered Nurse  PLACE OF SERVICE:  [facility]  Advanced Directive information    No Known Allergies  Chief Complaint  Patient presents with   Medical Management of Chronic Issues    6 month follow up.    Immunizations    Discuss the need for Shingrix vaccine, and Covid Booster. [facility] Verified.    Health Maintenance    Discuss the need for Hemoglobin A1C, and Urine microalbumin.      HPI: Patient is a 72 y.o. male for follow up Repots he is snacking more.  Feels like his numbers wont look great due to this but plans on cutting back Blood sugar this Am was 125.  Bedtime blood sugars around 100s No low blood sugars  Driving a lot, not walking a lot Does climb stairs more.   No abnormal aches or pain Sleeping well.   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  HENT:  Negative for tinnitus.   Respiratory:  Negative for cough, sputum production and shortness of breath.   Cardiovascular:  Negative for chest pain, palpitations and leg swelling.  Gastrointestinal:  Negative for abdominal pain, constipation, diarrhea and heartburn.  Genitourinary:  Negative for dysuria, frequency and urgency.  Musculoskeletal:  Negative for back pain, falls, joint pain and myalgias.  Skin: Negative.   Neurological:  Negative for dizziness and headaches.  Psychiatric/Behavioral:  Negative for depression and memory loss. The patient does not have insomnia.     Past Medical History:  Diagnosis  Date   Allergy    Cataract    child,small ones developing   Diabetes mellitus without complication (HCC)    Edema    GERD (gastroesophageal reflux disease)    Hyperlipidemia    Hypertension    Obesity, unspecified    Problems with sexual function    Prostate cancer (HCC)    Sleep apnea    released per MD, no CPAP   Unspecified sleep apnea    Unspecified vitamin D deficiency    Past Surgical History:  Procedure Laterality Date   CATARACT EXTRACTION  1964   Dr.Freed    COLONOSCOPY     HERNIA REPAIR  1987   Right Side, Dr.Kaufman    POLYPECTOMY     PROSTATE BIOPSY     radation markers     Social History:   reports that he has never smoked. He has never used smokeless tobacco. He reports that he does not drink alcohol and does not use drugs.  Family History  Problem Relation Age of Onset   Colon cancer Mother 38   Breast cancer Mother        dx. 40s   Lung cancer Father 73   Breast cancer Sister        dx. 50s   Ovarian cancer Sister        dx. 60s   Cancer Maternal Aunt        unknown type   Cancer Maternal Uncle        unknown type   Ovarian cancer Cousin  Breast cancer Cousin 52   Esophageal cancer Neg Hx    Stomach cancer Neg Hx    Rectal cancer Neg Hx    Colon polyps Neg Hx    Crohn's disease Neg Hx    Ulcerative colitis Neg Hx     Medications: Patient's Medications  New Prescriptions   No medications on file  Previous Medications   ASPIRIN 81 MG TABLET    Take 81 mg by mouth daily.   ATORVASTATIN (LIPITOR) 40 MG TABLET    TAKE 1 TABLET BY MOUTH EVERYDAY AT BEDTIME   BENZONATATE (TESSALON) 200 MG CAPSULE    Take 200 mg by mouth 3 (three) times daily as needed.   CALCIUM CARB-CHOLECALCIFEROL (CALCIUM CARBONATE+VITAMIN D PO)    Take by mouth.   CHOLECALCIFEROL (VITAMIN D3 PO)    Take 2,000 Units by mouth daily.   ENALAPRIL (VASOTEC) 5 MG TABLET    TAKE 1 TABLET BY MOUTH EVERY DAY   GLIPIZIDE (GLUCOTROL XL) 2.5 MG 24 HR TABLET    TAKE 1 TABLET BY  MOUTH EVERY DAY WITH BREAKFAST   IBUPROFEN (ADVIL) 600 MG TABLET    Take 1 tablet (600 mg total) by mouth every 8 (eight) hours as needed.   IPRATROPIUM (ATROVENT) 0.06 % NASAL SPRAY    Place 1 spray into the nose as needed.   METFORMIN (GLUCOPHAGE) 1000 MG TABLET    TAKE 1 TABLET (1,000 MG TOTAL) BY MOUTH TWICE A DAY WITH FOOD   SILDENAFIL (VIAGRA) 100 MG TABLET    0.5-1 tab 1 hour prior to intercourse   TAMSULOSIN (FLOMAX) 0.4 MG CAPS CAPSULE    Take 1 capsule (0.4 mg total) by mouth daily.   UNABLE TO FIND    Med Name: Freestyle Libra Plus check blood sugar three times daily  Modified Medications   No medications on file  Discontinued Medications   No medications on file    Physical Exam:  Vitals:   07/01/23 0913  BP: 126/80  Pulse: (!) 58  Temp: 97.7 F (36.5 C)  SpO2: 97%  Weight: 247 lb (112 kg)  Height: 6\' 1"  (1.854 m)   Body mass index is 32.59 kg/m. Wt Readings from Last 3 Encounters:  07/01/23 247 lb (112 kg)  04/29/23 251 lb (113.9 kg)  01/16/23 238 lb (108 kg)    Physical Exam Constitutional:      General: He is not in acute distress.    Appearance: He is well-developed. He is not diaphoretic.  HENT:     Head: Normocephalic and atraumatic.     Right Ear: External ear normal.     Left Ear: External ear normal.     Mouth/Throat:     Pharynx: No oropharyngeal exudate.  Eyes:     Conjunctiva/sclera: Conjunctivae normal.     Pupils: Pupils are equal, round, and reactive to light.  Cardiovascular:     Rate and Rhythm: Normal rate and regular rhythm.     Heart sounds: Normal heart sounds.  Pulmonary:     Effort: Pulmonary effort is normal.     Breath sounds: Normal breath sounds.  Abdominal:     General: Bowel sounds are normal.     Palpations: Abdomen is soft.  Musculoskeletal:        General: No tenderness.     Cervical back: Normal range of motion and neck supple.     Right lower leg: No edema.     Left lower leg: No edema.  Skin:  General: Skin  is warm and dry.  Neurological:     Mental Status: He is alert and oriented to person, place, and time.     Labs reviewed: Basic Metabolic Panel: Recent Labs    12/24/22 0922  NA 139  K 4.8  CL 105  CO2 25  GLUCOSE 132*  BUN 19  CREATININE 0.83  CALCIUM 9.6   Liver Function Tests: Recent Labs    12/24/22 0922  AST 22  ALT 35  BILITOT 0.7  PROT 6.6   No results for input(s): "LIPASE", "AMYLASE" in the last 8760 hours. No results for input(s): "AMMONIA" in the last 8760 hours. CBC: Recent Labs    12/24/22 0922  WBC 3.2*  NEUTROABS 1,686  HGB 12.2*  HCT 36.5*  MCV 81.3  PLT 211   Lipid Panel: No results for input(s): "CHOL", "HDL", "LDLCALC", "TRIG", "CHOLHDL", "LDLDIRECT" in the last 8760 hours. TSH: No results for input(s): "TSH" in the last 8760 hours. A1C: Lab Results  Component Value Date   HGBA1C 6.9 (H) 12/24/2022     Assessment/Plan 1. Hyperlipidemia, unspecified hyperlipidemia type Continues on lipitor, encouraged dietary modifications - Lipid panel  2. Essential hypertension -Blood pressure well controlled, goal bp <140/90 Continue current medications and dietary modifications follow metabolic panel - COMPLETE METABOLIC PANEL WITH GFR - CBC with Differential/Platelet  3. Type 2 diabetes mellitus with diabetic polyneuropathy, without long-term current use of insulin (HCC) -Encouraged dietary compliance, routine foot care/monitoring and to keep up with diabetic eye exams through ophthalmology  -continues on metformin and glipizide, no hypoglycemia-  - Hemoglobin A1c - Urine Albumin/Creatinine with ratio (send out) [[id number]]  4. Morbid obesity (HCC) --education provided on healthy weight loss through increase in physical activity and proper nutrition   5. Malignant neoplasm of prostate (HCC) Symptoms are stable, he is being followed by urology at this time   Return in about 6 months (around 12/29/2023) for routine follow up.  Janene Harvey. Biagio Borg [facility] [phone]

## 2023-07-02 LAB — CBC WITH DIFFERENTIAL/PLATELET
Absolute Lymphocytes: 898 {cells}/uL (ref 850–3900)
Absolute Monocytes: 393 {cells}/uL (ref 200–950)
Basophils Absolute: 20 {cells}/uL (ref 0–200)
Basophils Relative: 0.6 %
Eosinophils Absolute: 132 {cells}/uL (ref 15–500)
Eosinophils Relative: 4 %
HCT: 38.3 % — ABNORMAL LOW (ref 38.5–50.0)
Hemoglobin: 12.5 g/dL — ABNORMAL LOW (ref 13.2–17.1)
MCH: 27.3 pg (ref 27.0–33.0)
MCHC: 32.6 g/dL (ref 32.0–36.0)
MCV: 83.6 fL (ref 80.0–100.0)
MPV: 10.5 fL (ref 7.5–12.5)
Monocytes Relative: 11.9 %
Neutro Abs: 1858 {cells}/uL (ref 1500–7800)
Neutrophils Relative %: 56.3 %
Platelets: 210 10*3/uL (ref 140–400)
RBC: 4.58 10*6/uL (ref 4.20–5.80)
RDW: 13.8 % (ref 11.0–15.0)
Total Lymphocyte: 27.2 %
WBC: 3.3 10*3/uL — ABNORMAL LOW (ref 3.8–10.8)

## 2023-07-02 LAB — LIPID PANEL
Cholesterol: 118 mg/dL (ref <200)
HDL: 35 mg/dL — ABNORMAL LOW (ref >=40)
LDL Cholesterol (Calc): 62 mg/dL
Non-HDL Cholesterol (Calc): 83 mg/dL (ref <130)
Total CHOL/HDL Ratio: 3.4 (calc) (ref <5.0)
Triglycerides: 127 mg/dL (ref <150)

## 2023-07-02 LAB — COMPLETE METABOLIC PANEL WITH GFR
AG Ratio: 1.6 (calc) (ref 1.0–2.5)
ALT: 29 U/L (ref 9–46)
AST: 24 U/L (ref 10–35)
Albumin: 4.3 g/dL (ref 3.6–5.1)
Alkaline phosphatase (APISO): 54 U/L (ref 35–144)
BUN: 18 mg/dL (ref 7–25)
CO2: 24 mmol/L (ref 20–32)
Calcium: 9.5 mg/dL (ref 8.6–10.3)
Chloride: 106 mmol/L (ref 98–110)
Creat: 0.97 mg/dL (ref 0.70–1.28)
Globulin: 2.7 g/dL (ref 1.9–3.7)
Glucose, Bld: 138 mg/dL — ABNORMAL HIGH (ref 65–99)
Potassium: 4.6 mmol/L (ref 3.5–5.3)
Sodium: 138 mmol/L (ref 135–146)
Total Bilirubin: 0.8 mg/dL (ref 0.2–1.2)
Total Protein: 7 g/dL (ref 6.1–8.1)
eGFR: 83 mL/min/{1.73_m2} (ref >=60)

## 2023-07-02 LAB — MICROALBUMIN / CREATININE URINE RATIO
Creatinine, Urine: 152 mg/dL (ref 20–320)
Microalb Creat Ratio: 9 mg/g{creat} (ref <30)
Microalb, Ur: 1.3 mg/dL

## 2023-07-02 LAB — HEMOGLOBIN A1C
Hgb A1c MFr Bld: 7 %{Hb} — ABNORMAL HIGH (ref <5.7)
Mean Plasma Glucose: 154 mg/dL
eAG (mmol/L): 8.5 mmol/L

## 2023-07-21 ENCOUNTER — Other Ambulatory Visit: Payer: Self-pay | Admitting: Nurse Practitioner

## 2023-07-21 DIAGNOSIS — E785 Hyperlipidemia, unspecified: Secondary | ICD-10-CM

## 2023-08-23 ENCOUNTER — Other Ambulatory Visit: Payer: Self-pay | Admitting: Nurse Practitioner

## 2023-08-30 ENCOUNTER — Ambulatory Visit: Payer: PPO | Admitting: Podiatry

## 2023-08-30 ENCOUNTER — Encounter: Payer: Self-pay | Admitting: Podiatry

## 2023-08-30 VITALS — Ht 73.0 in | Wt 247.0 lb

## 2023-08-30 DIAGNOSIS — M79674 Pain in right toe(s): Secondary | ICD-10-CM

## 2023-08-30 DIAGNOSIS — B351 Tinea unguium: Secondary | ICD-10-CM

## 2023-08-30 DIAGNOSIS — M79675 Pain in left toe(s): Secondary | ICD-10-CM

## 2023-08-30 DIAGNOSIS — E1165 Type 2 diabetes mellitus with hyperglycemia: Secondary | ICD-10-CM | POA: Diagnosis not present

## 2023-09-06 NOTE — Progress Notes (Signed)
  Subjective:  Patient ID: Raymond [name], male    DOB: 12-29-1950,  MRN: [id number]  73 y.o. male presents to clinic with  preventative diabetic foot care and painful, elongated thickened toenails x 10 which are symptomatic when wearing enclosed shoe gear. This interferes with his/her daily activities.  Chief Complaint  Patient presents with   Nail Problem    Pt is here for [facility] last A1C was 6.7, PCP is Dr Janyth Contes and LOV was in September.    New problem(s): None   PCP is Sharon Seller, NP.  No Known Allergies  Review of Systems: Negative except as noted in the HPI.   Objective:  Raymond [name] is a pleasant 73 y.o. male in NAD. AAO x 3.  Vascular Examination: Vascular status intact b/l with palpable pedal pulses. CFT immediate b/l. No edema. No pain with calf compression b/l. Skin temperature gradient WNL b/l. No ischemia or gangrene noted b/l LE. No cyanosis or clubbing noted b/l LE.  Neurological Examination: Sensation grossly intact b/l with 10 gram monofilament. Vibratory sensation intact b/l.   Dermatological Examination: Pedal skin with normal turgor, texture and tone b/l. Toenails 1-5 b/l thick, discolored, elongated with subungual debris and pain on dorsal palpation. No corns, calluses nor porokeratotic lesions noted.  Musculoskeletal Examination: Muscle strength 5/5 to b/l LE. No pain, crepitus or joint limitation noted with ROM bilateral LE. No gross bony deformities bilaterally.  Radiographs: None  Last A1c:      Latest Ref Rng & Units 07/01/2023    9:33 AM 12/24/2022    9:22 AM  Hemoglobin A1C  Hemoglobin-A1c <5.7 % of total Hgb 7.0  6.9      Assessment:   1. Pain due to onychomycosis of toenails of both feet   2. Type 2 diabetes mellitus with hyperglycemia, without long-term current use of insulin (HCC)    Plan:  Patient was evaluated and treated. All patient's and/or POA's questions/concerns addressed on today's visit. Toenails 1-5 debrided in length  and girth without incident. Continue soft, supportive shoe gear daily. Report any pedal injuries to medical professional. Call office if there are any questions/concerns. -Continue foot and shoe inspections daily. Monitor blood glucose per PCP/Endocrinologist's recommendations. -Patient/POA to call should there be question/concern in the interim.  Return in about 3 months (around 11/28/2023).  Freddie Breech, DPM      [place] LOCATION: [address], [place] [address]                   Office [phone]   [facility] LOCATION: [address] [place], [place] [address] Office [phone]

## 2023-09-07 ENCOUNTER — Encounter: Payer: Self-pay | Admitting: Nurse Practitioner

## 2023-09-09 MED ORDER — FREESTYLE LIBRE 3 READER DEVI: 0 refills | Status: DC

## 2023-09-27 DIAGNOSIS — H2701 Aphakia, right eye: Secondary | ICD-10-CM | POA: Diagnosis not present

## 2023-09-27 DIAGNOSIS — C61 Malignant neoplasm of prostate: Secondary | ICD-10-CM | POA: Diagnosis not present

## 2023-09-27 DIAGNOSIS — H40013 Open angle with borderline findings, low risk, bilateral: Secondary | ICD-10-CM | POA: Diagnosis not present

## 2023-09-27 DIAGNOSIS — H524 Presbyopia: Secondary | ICD-10-CM | POA: Diagnosis not present

## 2023-09-27 DIAGNOSIS — H25812 Combined forms of age-related cataract, left eye: Secondary | ICD-10-CM | POA: Diagnosis not present

## 2023-09-27 DIAGNOSIS — E119 Type 2 diabetes mellitus without complications: Secondary | ICD-10-CM | POA: Diagnosis not present

## 2023-09-27 LAB — HM DIABETES EYE EXAM

## 2023-09-27 LAB — PSA: PSA: 0.017

## 2023-10-03 DIAGNOSIS — L821 Other seborrheic keratosis: Secondary | ICD-10-CM | POA: Diagnosis not present

## 2023-10-03 DIAGNOSIS — D225 Melanocytic nevi of trunk: Secondary | ICD-10-CM | POA: Diagnosis not present

## 2023-10-03 DIAGNOSIS — L570 Actinic keratosis: Secondary | ICD-10-CM | POA: Diagnosis not present

## 2023-10-03 DIAGNOSIS — L814 Other melanin hyperpigmentation: Secondary | ICD-10-CM | POA: Diagnosis not present

## 2023-10-04 DIAGNOSIS — C61 Malignant neoplasm of prostate: Secondary | ICD-10-CM | POA: Diagnosis not present

## 2023-11-09 DIAGNOSIS — M1712 Unilateral primary osteoarthritis, left knee: Secondary | ICD-10-CM | POA: Diagnosis not present

## 2023-11-09 DIAGNOSIS — M25462 Effusion, left knee: Secondary | ICD-10-CM | POA: Diagnosis not present

## 2023-11-09 DIAGNOSIS — M25562 Pain in left knee: Secondary | ICD-10-CM | POA: Diagnosis not present

## 2023-11-12 ENCOUNTER — Ambulatory Visit (INDEPENDENT_AMBULATORY_CARE_PROVIDER_SITE_OTHER): Admitting: Family

## 2023-11-12 ENCOUNTER — Encounter: Payer: Self-pay | Admitting: Family

## 2023-11-12 VITALS — BP 134/82 | HR 67 | Temp 97.9°F | Resp 20 | Ht 73.0 in | Wt 257.4 lb

## 2023-11-12 DIAGNOSIS — M25562 Pain in left knee: Secondary | ICD-10-CM | POA: Diagnosis not present

## 2023-11-12 MED ORDER — IBUPROFEN 600 MG PO TABS: 600.0000 mg | ORAL_TABLET | Freq: Three times a day (TID) | ORAL | 1 refills | Status: DC | PRN

## 2023-11-12 NOTE — Progress Notes (Signed)
 Provider: Humbert Morozov FNP-C  Sharon Seller, NP  Patient Care Team: Sharon Seller, NP as PCP - General (Geriatric Medicine) Mateo Flow, MD as Consulting Physician (Ophthalmology) Margaretmary Dys, MD as Consulting Physician (Radiation Oncology) Heloise Purpura, MD as Consulting Physician (Urology) Axel Filler, Larna Daughters, NP as Nurse Practitioner (Hematology and Oncology) Maryclare Labrador, RN as Registered Nurse  Extended Emergency Contact Information Primary Emergency Contact: Ladoris Gene Address: [address]          [place],  [address] Home Phone: [phone] Relation: Spouse  Code Status:Full Code  Goals of care: Advanced Directive information    11/12/2023    1:24 PM  Advanced Directives  Does Patient Have a Medical Advance Directive? Yes  Type of Estate agent of [place];Living will  Does patient want to make changes to medical advance directive? No - Patient declined  Copy of Healthcare Power of Attorney in Chart? Yes - validated most recent copy scanned in chart (See row information)     Chief Complaint  Patient presents with   Acute Visit    Knee pain and joint pain.    Discussed the use of AI scribe software for clinical note transcription with the patient, who gave verbal consent to proceed.  History of Present Illness   Raymond [name] is a 73 year old male who presents with left knee pain.  He has been experiencing left knee pain for the past couple of weeks, which sometimes radiates down to the shin. The pain intensity varies, reaching up to 8 out of 10 by the end of the day, especially after prolonged activity. He has not used any hot or cold compresses yet.  An x-ray performed at urgent care showed no fracture or misalignment but revealed small joint effusion and mild degenerative changes consistent with arthritis. He has been using a knee brace but is unsure if it needs to be worn constantly. There is no  knee instability or weakness in the lower leg.  For pain management, he has been taking one Motrin with two extra strength Tylenol, which provides some relief. He has previously used ibuprofen 600 mg for rib pain following a fall and inquires about its use for his current knee pain. He also uses a generic version of Voltaren gel at night, which he finds helpful. He is cautious about taking medications that might cause drowsiness during the day, especially when driving.  No numbness or tingling in the foot and no weakness in the lower leg. He does not smoke.   Past Medical History:  Diagnosis Date   Allergy    Cataract    child,small ones developing   Diabetes mellitus without complication (HCC)    Edema    GERD (gastroesophageal reflux disease)    Hyperlipidemia    Hypertension    Obesity, unspecified    Problems with sexual function    Prostate cancer (HCC)    Sleep apnea    released per MD, no CPAP   Unspecified sleep apnea    Unspecified vitamin D deficiency    Past Surgical History:  Procedure Laterality Date   CATARACT EXTRACTION  1964   Dr.Freed    COLONOSCOPY     HERNIA REPAIR  1987   Right Side, Dr.Kaufman    POLYPECTOMY     PROSTATE BIOPSY     radation markers      No Known Allergies  Outpatient Encounter Medications as of 11/12/2023  Medication Sig   aspirin  81 MG tablet Take 81 mg by mouth daily.   atorvastatin (LIPITOR) 40 MG tablet TAKE 1 TABLET BY MOUTH EVERYDAY AT BEDTIME   Calcium Carb-Cholecalciferol (CALCIUM CARBONATE+VITAMIN D PO) Take by mouth.   Cholecalciferol (VITAMIN D3 PO) Take 2,000 Units by mouth daily.   Continuous Glucose Receiver (FREESTYLE LIBRE 3 READER) DEVI Use to check blood sugar three times daily. Dx: E11.42   Continuous Glucose Sensor (FREESTYLE LIBRE 3 PLUS SENSOR) MISC Use to check blood sugar three times daily.   enalapril (VASOTEC) 5 MG tablet TAKE 1 TABLET BY MOUTH EVERY DAY   glipiZIDE (GLUCOTROL XL) 2.5 MG 24 hr tablet TAKE 1  TABLET BY MOUTH EVERY DAY WITH BREAKFAST   metFORMIN (GLUCOPHAGE) 1000 MG tablet TAKE 1 TABLET (1,000 MG TOTAL) BY MOUTH TWICE A DAY WITH FOOD   sildenafil (VIAGRA) 100 MG tablet 0.5-1 tab 1 hour prior to intercourse   tamsulosin (FLOMAX) 0.4 MG CAPS capsule Take 1 capsule (0.4 mg total) by mouth daily.   UNABLE TO FIND Med Name: Freestyle Libra Plus check blood sugar three times daily   ibuprofen (ADVIL) 600 MG tablet Take 1 tablet (600 mg total) by mouth every 8 (eight) hours as needed.   [DISCONTINUED] ibuprofen (ADVIL) 600 MG tablet Take 1 tablet (600 mg total) by mouth every 8 (eight) hours as needed. (Patient not taking: Reported on 11/12/2023)   No facility-administered encounter medications on file as of 11/12/2023.    Review of Systems  Constitutional:  Negative for appetite change, chills, fatigue, fever and unexpected weight change.  Eyes:  Negative for pain, discharge, redness, itching and visual disturbance.  Respiratory:  Negative for cough, chest tightness, shortness of breath and wheezing.   Cardiovascular:  Negative for chest pain, palpitations and leg swelling.  Gastrointestinal:  Negative for abdominal distention, abdominal pain, blood in stool, constipation, diarrhea, nausea and vomiting.  Genitourinary:  Negative for difficulty urinating, dysuria, flank pain, frequency and urgency.  Musculoskeletal:  Positive for arthralgias. Negative for back pain, gait problem, joint swelling, myalgias, neck pain and neck stiffness.       Left knee pain   Skin:  Negative for color change, pallor, rash and wound.  Neurological:  Negative for dizziness, weakness, light-headedness, numbness and headaches.    Immunization History  Administered Date(s) Administered   Fluad Quad(high Dose 65+) 06/22/2019, 06/27/2020, 06/23/2021, 06/09/2022   Fluad Trivalent(High Dose 65+) 04/29/2023   Influenza, High Dose Seasonal PF 08/26/2017   Influenza,inj,Quad PF,6+ Mos 04/01/2013, 07/12/2014,  05/26/2015, 04/17/2016, 05/30/2018   PFIZER(Purple Top)SARS-COV-2 Vaccination 10/02/2019, 10/30/2019, 05/31/2020   Pfizer Covid-19 Vaccine Bivalent Booster 49yrs & up 05/29/2021   Pfizer(Comirnaty)Fall Seasonal Vaccine 12 years and older 06/09/2022   Pneumococcal Conjugate-13 07/19/2016   Pneumococcal Polysaccharide-23 09/20/2011, 01/27/2018   Td 08/06/2002   Tdap 12/01/2013   Zoster Recombinant(Shingrix) 11/08/2022   Zoster, Live 09/16/2012   Pertinent  Health Maintenance Due  Topic Date Due   FOOT EXAM  12/24/2023   HEMOGLOBIN A1C  12/29/2023   INFLUENZA VACCINE  03/06/2024   OPHTHALMOLOGY EXAM  09/26/2024   Colonoscopy  01/15/2026      04/19/2022    9:53 AM 06/25/2022    9:09 AM 12/24/2022    8:49 AM 04/29/2023   10:35 AM 11/12/2023    1:23 PM  Fall Risk  Falls in the past year? 0 0 1 1 1   Was there an injury with Fall? 0 0 1 1 1   Was there an injury with Fall? - Comments  Injured left side Rib injury   Fall Risk Category Calculator 0 0 2 2 2   Fall Risk Category (Retired) Low Low     (RETIRED) Patient Fall Risk Level Low fall risk Low fall risk     Patient at Risk for Falls Due to No Fall Risks No Fall Risks History of fall(s) History of fall(s) History of fall(s)  Fall risk Follow up Falls evaluation completed Falls evaluation completed Falls evaluation completed Falls evaluation completed Falls evaluation completed   Functional Status Survey:    Vitals:   11/12/23 1328  BP: 134/82  Pulse: 67  Resp: 20  Temp: 97.9 F (36.6 C)  SpO2: 95%  Weight: 257 lb 6.4 oz (116.8 kg)  Height: 6\' 1"  (1.854 m)   Body mass index is 33.96 kg/m. Physical Exam  GENERAL: Alert, cooperative, well developed, no acute distress. HEENT: Normocephalic, normal oropharynx, moist mucous membranes. CHEST: Clear to auscultation bilaterally, no wheezes, rhonchi, or crackles. CARDIOVASCULAR: Normal heart rate and rhythm, S1 and S2 normal without murmurs. ABDOMEN: Soft, non-tender,  non-distended, without organomegaly, normal bowel sounds. EXTREMITIES: No cyanosis or edema. MUSCULOSKELETAL: Left knee pain on extension, no swelling, no tenderness, no fluid. NEUROLOGICAL: Cranial nerves grossly intact, moves all extremities without gross motor or sensory deficit.    Labs reviewed: Recent Labs    12/24/22 0922 07/01/23 0933  NA 139 138  K 4.8 4.6  CL 105 106  CO2 25 24  GLUCOSE 132* 138*  BUN 19 18  CREATININE 0.83 0.97  CALCIUM 9.6 9.5   Recent Labs    12/24/22 0922 07/01/23 0933  AST 22 24  ALT 35 29  BILITOT 0.7 0.8  PROT 6.6 7.0   Recent Labs    12/24/22 0922 07/01/23 0933  WBC 3.2* 3.3*  NEUTROABS 1,686 1,858  HGB 12.2* 12.5*  HCT 36.5* 38.3*  MCV 81.3 83.6  PLT 211 210   No results found for: "TSH" Lab Results  Component Value Date   HGBA1C 7.0 (H) 07/01/2023   Lab Results  Component Value Date   CHOL 118 07/01/2023   HDL 35 (L) 07/01/2023   LDLCALC 62 07/01/2023   TRIG 127 07/01/2023   CHOLHDL 3.4 07/01/2023    Significant Diagnostic Results in last 30 days:  No results found.  Assessment/Plan  Left Knee Pain with Mild Degenerative Changes Experiencing left knee pain for the past couple of weeks, with severity ranging from 6 to 8 out of 10. Pain sometimes radiates to the shin without numbness or tingling. X-ray from urgent care showed no fracture or misalignment but revealed small joint effusion and mild degenerative changes, indicating arthritis. Vascular calcification noted. Using a knee brace and taking Motrin and Tylenol for pain management, which provides some relief. Further evaluation by an orthopedic specialist is recommended to assess joint fluid and consider options such as aspiration, cortisone injection, or physical therapy. Cortisone injections can provide relief for 3 to 6 months. Advised to use ice to reduce swelling and avoid activities that exacerbate pain. - Refer to orthopedic specialist for evaluation and  management of knee effusion and degenerative changes. - Prescribe ibuprofen 600 mg with instructions to take with food to avoid stomach irritation. - Advise alternating ibuprofen with Tylenol for arthritis for pain management. - Instruct to apply ice to the knee to reduce swelling. - Advise against wearing the knee brace while sleeping and to remove it when resting. - Instruct to report worsening symptoms such as increased swelling or redness.  Follow-up  Has a follow-up appointment scheduled with Shanda Bumps on May 30th for a six-month follow-up and a Medicare visit on September 29th. - Ensure follow-up appointment with Shanda Bumps on May 30th at 9:20 AM. - Ensure Medicare visit on September 29th at 10:00 AM.   Family/ staff Communication: Reviewed plan of care with patient verbalized understanding   Labs/tests ordered: None   Next Appointment: Return if symptoms worsen or fail to improve.   Total time: 20 minutes. Greater than 50% of total time spent doing patient education regarding left knee pain,health maintenance including symptom/medication management.   Caesar Bookman, NP

## 2023-11-15 ENCOUNTER — Other Ambulatory Visit (INDEPENDENT_AMBULATORY_CARE_PROVIDER_SITE_OTHER): Payer: Self-pay

## 2023-11-15 ENCOUNTER — Encounter: Payer: Self-pay | Admitting: Physician Assistant

## 2023-11-15 ENCOUNTER — Ambulatory Visit: Admitting: Physician Assistant

## 2023-11-15 DIAGNOSIS — M25562 Pain in left knee: Secondary | ICD-10-CM | POA: Diagnosis not present

## 2023-11-15 DIAGNOSIS — G8929 Other chronic pain: Secondary | ICD-10-CM | POA: Diagnosis not present

## 2023-11-15 NOTE — Progress Notes (Signed)
 Office Visit Note   Patient: Raymond [name]           Date of Birth: 05-17-1951           MRN: [id number] Visit Date: 11/15/2023              Requested by: Caesar Bookman, NP [address] [place],  [place] [address] PCP: Sharon Seller, NP   Assessment & Plan: Visit Diagnoses:  1. Chronic pain of left knee     Plan: [name] is pleasant 73 year old gentleman that presents with about a 4-week history of left knee pain.  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  He has been using Tylenol and ibuprofen 1 time a day 600 mg.  He is getting a little bit better he I do not appreciate more than of very small effusion today his range of motion is excellent no medial lateral joint line significant tenderness though medially is more tender than laterally.  X-rays demonstrate fairly well-maintained joint spacing little bit of medial arthritis.  We talked about options.  I will give him a knee close chain strengthening exercises.  Offered to give an injection today but he would like to defer that.  Also talked about Voltaren gel which she is always using.  He has good kidney function I think it would be completely reasonable for the next few weeks for him to increase his ibuprofen to twice daily then after that could decrease to once daily.  Can follow-up with me if he wants an injection or further care  Follow-Up Instructions: Return in about 1 month (around 12/15/2023).   Orders:  Orders Placed This Encounter  Procedures   XR KNEE 3 VIEW LEFT   No orders of the defined types were placed in this encounter.     Procedures: No procedures performed   Clinical Data: No additional findings.   Subjective: No chief complaint on file.   HPI Patient is a pleasant 73 year old gentleman presents today with left knee pain.  He said he possibly hit it and twisted about 2 to 4 weeks ago.  He has  been taking Tylenol and ibuprofen  Review of Systems  All other systems reviewed and are negative.    Objective: Vital Signs: There were no vitals taken for this visit.  Physical Exam Constitutional:      Appearance: Normal appearance.  Pulmonary:     Effort: Pulmonary effort is normal.  Skin:    General: Skin is warm and dry.  Neurological:     Mental Status: He is alert.  Psychiatric:        Mood and Affect: Mood normal.        Behavior: Behavior normal.     Ortho Exam Left knee: Minimal effusion.  No grinding with range of motion compartments are soft and compressible he is neurovascularly intact no erythema tender more medially than laterally good ligamentous stability Specialty Comments:  No specialty comments available.  Imaging: XR KNEE 3 VIEW LEFT Result Date: 11/15/2023 Radiographs of his left knee demonstrate no acute fractures no osseous lesions.  Some very minimal joint space narrowing over the medial compartment    PMFS History: Patient Active Problem List   Diagnosis Date Noted   Pain in left knee 11/15/2023   Genetic testing 08/22/2021   Malignant neoplasm of prostate (HCC) 01/05/2021   Pain  due to onychomycosis of toenails of both feet 12/07/2019   Porokeratosis 12/07/2019   Plantar flexed metatarsal bone of left foot 12/07/2019   Type 2 diabetes mellitus without complication (HCC) 08/04/2019   Calcaneal spur 09/06/2017   Type 2 diabetes mellitus with hyperglycemia, without long-term current use of insulin (HCC) 04/01/2013   Morbid obesity (HCC)    General medical examination 03/07/2010   Hyperlipemia 03/16/2009   OBSTRUCTIVE SLEEP APNEA 03/16/2009   Essential hypertension 03/15/2009   Vitamin D deficiency 03/04/2009   Edema 02/14/2009   GERD (gastroesophageal reflux disease) 10/22/2007   Sleep apnea 07/03/2005   Past Medical History:  Diagnosis Date   Allergy    Cataract    child,small ones developing   Diabetes mellitus without  complication (HCC)    Edema    GERD (gastroesophageal reflux disease)    Hyperlipidemia    Hypertension    Obesity, unspecified    Problems with sexual function    Prostate cancer (HCC)    Sleep apnea    released per MD, no CPAP   Unspecified sleep apnea    Unspecified vitamin D deficiency     Family History  Problem Relation Age of Onset   Colon cancer Mother 36   Breast cancer Mother        dx. 40s   Lung cancer Father 27   Breast cancer Sister        dx. 50s   Ovarian cancer Sister        dx. 60s   Cancer Maternal Aunt        unknown type   Cancer Maternal Uncle        unknown type   Ovarian cancer Cousin    Breast cancer Cousin 74   Esophageal cancer Neg Hx    Stomach cancer Neg Hx    Rectal cancer Neg Hx    Colon polyps Neg Hx    Crohn's disease Neg Hx    Ulcerative colitis Neg Hx     Past Surgical History:  Procedure Laterality Date   CATARACT EXTRACTION  1964   Dr.Freed    COLONOSCOPY     HERNIA REPAIR  1987   Right Side, Dr.Kaufman    POLYPECTOMY     PROSTATE BIOPSY     radation markers     Social History   Occupational History   Not on file  Tobacco Use   Smoking status: Never   Smokeless tobacco: Never  Vaping Use   Vaping status: Never Used  Substance and Sexual Activity   Alcohol use: No   Drug use: No   Sexual activity: Yes

## 2023-11-22 DIAGNOSIS — Z20822 Contact with and (suspected) exposure to covid-19: Secondary | ICD-10-CM | POA: Diagnosis not present

## 2023-11-22 DIAGNOSIS — J302 Other seasonal allergic rhinitis: Secondary | ICD-10-CM | POA: Diagnosis not present

## 2023-11-22 DIAGNOSIS — R051 Acute cough: Secondary | ICD-10-CM | POA: Diagnosis not present

## 2023-11-29 ENCOUNTER — Encounter: Payer: Self-pay | Admitting: Podiatry

## 2023-11-29 ENCOUNTER — Ambulatory Visit: Payer: PPO | Admitting: Podiatry

## 2023-11-29 VITALS — Ht 73.0 in | Wt 257.4 lb

## 2023-11-29 DIAGNOSIS — M79675 Pain in left toe(s): Secondary | ICD-10-CM

## 2023-11-29 DIAGNOSIS — M79674 Pain in right toe(s): Secondary | ICD-10-CM

## 2023-11-29 DIAGNOSIS — B351 Tinea unguium: Secondary | ICD-10-CM

## 2023-11-29 DIAGNOSIS — E1165 Type 2 diabetes mellitus with hyperglycemia: Secondary | ICD-10-CM | POA: Diagnosis not present

## 2023-12-04 ENCOUNTER — Other Ambulatory Visit: Payer: Self-pay | Admitting: Nurse Practitioner

## 2023-12-05 NOTE — Progress Notes (Signed)
  Subjective:  Patient ID: Raymond [name], male    DOB: [birth year],  MRN: [id number]  73 y.o. male presents to clinic with  preventative diabetic foot care and painful thick toenails that are difficult to trim. Pain interferes with ambulation. Aggravating factors include wearing enclosed shoe gear. Pain is relieved with periodic professional debridement.  Chief Complaint  Patient presents with   Nail Problem    Pt is here for [facility] last A1C was 6.9 PCP is Dr Roselie Conger and LOV was in November.   New problem(s): None   PCP is Verma Gobble, NP.  No Known Allergies  Review of Systems: Negative except as noted in the HPI.   Objective:  Raymond [name] is a pleasant 73 y.o. male in NAD. AAO x 3.  Vascular Examination: Vascular status intact b/l with palpable pedal pulses. CFT immediate b/l. No edema. No pain with calf compression b/l. Skin temperature gradient WNL b/l. No ischemia or gangrene noted b/l LE. No cyanosis or clubbing noted b/l LE.  Neurological Examination: Sensation grossly intact b/l with 10 gram monofilament. Vibratory sensation intact b/l.   Dermatological Examination: Pedal skin with normal turgor, texture and tone b/l. Toenails 1-5 b/l thick, discolored, elongated with subungual debris and pain on dorsal palpation. No hyperkeratotic lesions noted b/l.   Musculoskeletal Examination: Muscle strength 5/5 to b/l LE. No pain, crepitus or joint limitation noted with ROM bilateral LE. No gross bony deformities bilaterally. Patient ambulates independent of any assistive aids.  Radiographs: None  Last A1c:      Latest Ref Rng & Units 07/01/2023    9:33 AM 12/24/2022    9:22 AM  Hemoglobin A1C  Hemoglobin-A1c <5.7 % of total Hgb 7.0  6.9      Assessment:   1. Pain due to onychomycosis of toenails of both feet   2. Type 2 diabetes mellitus with hyperglycemia, without long-term current use of insulin (HCC)     Plan:  Consent given for treatment. Patient examined. All  patient's and/or POA's questions/concerns addressed on today's visit. Mycotic toenails 1-5 debrided in length and girth without incident. Continue foot and shoe inspections daily. Monitor blood glucose per PCP/Endocrinologist's recommendations.Continue soft, supportive shoe gear daily. Report any pedal injuries to medical professional. Call office if there are any quesitons/concerns. -Patient/POA to call should there be question/concern in the interim.  Return in about 3 months (around 02/28/2024).  Luella Sager, DPM      [facility] LOCATION: [address] [place] [address]                   Office [phone]   [facility] LOCATION: [address] [place], [place] [address] Office [phone]

## 2023-12-19 ENCOUNTER — Other Ambulatory Visit: Payer: Self-pay | Admitting: Nurse Practitioner

## 2024-01-03 ENCOUNTER — Encounter: Payer: PPO | Admitting: Nurse Practitioner

## 2024-01-03 DIAGNOSIS — E1142 Type 2 diabetes mellitus with diabetic polyneuropathy: Secondary | ICD-10-CM

## 2024-01-03 NOTE — Progress Notes (Signed)
 This encounter was created in error - please disregard.

## 2024-01-08 ENCOUNTER — Other Ambulatory Visit: Payer: Self-pay | Admitting: Nurse Practitioner

## 2024-01-08 MED ORDER — FREESTYLE LIBRE 3 PLUS SENSOR MISC: 5 refills | Status: DC

## 2024-01-08 NOTE — Telephone Encounter (Signed)
 Please see note below.

## 2024-01-08 NOTE — Telephone Encounter (Signed)
 Copied from CRM [id number]. Topic: Clinical - Medication Refill >> Jan 08, 2024  3:23 PM Shamecia H wrote: Medication: Continuous Glucose Sensor (FREESTYLE LIBRE 3 PLUS SENSOR) MISC  Has the patient contacted their pharmacy? Yes (Agent: If no, request that the patient contact the pharmacy for the refill. If patient does not wish to contact the pharmacy document the reason why and proceed with request.) (Agent: If yes, when and what did the pharmacy advise?)  This is the patient's preferred pharmacy:  [id number] [place], [place] - [address] [place] [address] Phone: [phone] Fax: [phone]  Is this the correct pharmacy for this prescription? Yes If no, delete pharmacy and type the correct one.   Has the prescription been filled recently? No  Is the patient out of the medication? Yes  Has the patient been seen for an appointment in the last year OR does the patient have an upcoming appointment? Yes  Can we respond through MyChart? Yes  Agent: Please be advised that Rx refills may take up to 3 business days. We ask that you follow-up with your pharmacy.

## 2024-01-08 NOTE — Telephone Encounter (Signed)
 Pt lost reader that was just prescribed  Refilled medication for pt

## 2024-01-18 ENCOUNTER — Other Ambulatory Visit: Payer: Self-pay | Admitting: Nurse Practitioner

## 2024-01-18 DIAGNOSIS — E785 Hyperlipidemia, unspecified: Secondary | ICD-10-CM

## 2024-01-31 ENCOUNTER — Encounter: Payer: Self-pay | Admitting: Nurse Practitioner

## 2024-01-31 ENCOUNTER — Ambulatory Visit (INDEPENDENT_AMBULATORY_CARE_PROVIDER_SITE_OTHER): Admitting: Nurse Practitioner

## 2024-01-31 VITALS — BP 138/80 | HR 70 | Temp 97.8°F | Ht 73.0 in | Wt 254.8 lb

## 2024-01-31 DIAGNOSIS — E6609 Other obesity due to excess calories: Secondary | ICD-10-CM

## 2024-01-31 DIAGNOSIS — Z8546 Personal history of malignant neoplasm of prostate: Secondary | ICD-10-CM | POA: Diagnosis not present

## 2024-01-31 DIAGNOSIS — I10 Essential (primary) hypertension: Secondary | ICD-10-CM | POA: Diagnosis not present

## 2024-01-31 DIAGNOSIS — E559 Vitamin D deficiency, unspecified: Secondary | ICD-10-CM | POA: Diagnosis not present

## 2024-01-31 DIAGNOSIS — Z6833 Body mass index (BMI) 33.0-33.9, adult: Secondary | ICD-10-CM

## 2024-01-31 DIAGNOSIS — E66811 Obesity, class 1: Secondary | ICD-10-CM | POA: Diagnosis not present

## 2024-01-31 DIAGNOSIS — E785 Hyperlipidemia, unspecified: Secondary | ICD-10-CM | POA: Diagnosis not present

## 2024-01-31 DIAGNOSIS — E1142 Type 2 diabetes mellitus with diabetic polyneuropathy: Secondary | ICD-10-CM | POA: Insufficient documentation

## 2024-01-31 NOTE — Assessment & Plan Note (Signed)
 Encouraged dietary compliance, routine foot care/monitoring and to keep up with diabetic eye exams through ophthalmology  Continue current regimen Encouraged regular meals and to eat snack with complex carb and protein prior to bed if he had an early dinner

## 2024-01-31 NOTE — Assessment & Plan Note (Signed)
 Blood pressure well controlled, goal bp <140/90 Continue current medications and dietary modifications follow metabolic panel

## 2024-01-31 NOTE — Progress Notes (Signed)
 Careteam: Patient Care Team: Caro Harlene POUR, NP as PCP - General (Geriatric Medicine) Cleatus Collar, MD as Consulting Physician (Ophthalmology) Patrcia Cough, MD as Consulting Physician (Radiation Oncology) Renda Glance, MD as Consulting Physician (Urology) Crawford, Morna Pickle, NP as Nurse Practitioner (Hematology and Oncology) Starla Wendelyn BIRCH, RN as Registered Nurse  PLACE OF SERVICE:  [facility]  Advanced Directive information    No Known Allergies  Chief Complaint  Patient presents with   Follow-up    6 month follow up Patient would like you take look at his left foot.    HPI:  Discussed the use of AI scribe software for clinical note transcription with the patient, who gave verbal consent to proceed.  History of Present Illness Raymond [name] is a 73 year old male with diabetes who presents for a six-month follow-up.  He uses the [facility] for glucose monitoring, with average glucose readings of 118 mg/dL for seven days, 879 mg/dL for fourteen days, and 121 mg/dL for thirty and ninety days. He experiences low blood sugar levels, with the lowest being 66 mg/dL at 88:69 PM on the 75uy. He typically eats a good breakfast and a full meal in the middle of the day, with dinner around 6:30 to 7:00 PM.  He is currently taking tamsulosin  and reports no changes in urinary frequency or flow. He is scheduled to see his urologist next month. He was previously on Lupron  and is now off it; his urologist told him he is cancer-free.  He continues to take atorvastatin  40 mg daily, enalapril , and vitamin D  supplements. He also takes a calcium  supplement in the form of a chocolate chew.  He has a toenail that has changed color, possibly due to stubbing it. He is seeing a podiatrist, Dr. May, who is monitoring the situation. He has a callus on the right ball of his foot, which his podiatrist has been working on.  He mentions a previous knee issue for which he  visited urgent care, where he was told he might have arthritis. The knee is currently not bothering him.     Review of Systems:  Review of Systems  Constitutional:  Negative for chills, fever and weight loss.  HENT:  Negative for tinnitus.   Respiratory:  Negative for cough, sputum production and shortness of breath.   Cardiovascular:  Negative for chest pain, palpitations and leg swelling.  Gastrointestinal:  Negative for abdominal pain, constipation, diarrhea and heartburn.  Genitourinary:  Negative for dysuria, frequency and urgency.  Musculoskeletal:  Negative for back pain, falls, joint pain and myalgias.  Skin: Negative.   Neurological:  Negative for dizziness and headaches.  Psychiatric/Behavioral:  Negative for depression and memory loss. The patient does not have insomnia.     Past Medical History:  Diagnosis Date   Allergy    Cataract    child,small ones developing   Diabetes mellitus without complication (HCC)    Edema    GERD (gastroesophageal reflux disease)    Hyperlipidemia    Hypertension    Obesity, unspecified    Problems with sexual function    Prostate cancer (HCC)    Sleep apnea    released per MD, no CPAP   Unspecified sleep apnea    Unspecified vitamin D  deficiency    Past Surgical History:  Procedure Laterality Date   CATARACT EXTRACTION  1964   Dr.Freed    COLONOSCOPY     HERNIA REPAIR  1987   Right Side, Dr.Kaufman  POLYPECTOMY     PROSTATE BIOPSY     radation markers     Social History:   reports that he has never smoked. He has never used smokeless tobacco. He reports that he does not drink alcohol and does not use drugs.  Family History  Problem Relation Age of Onset   Colon cancer Mother 43   Breast cancer Mother        dx. 40s   Lung cancer Father 49   Breast cancer Sister        dx. 50s   Ovarian cancer Sister        dx. 60s   Cancer Maternal Aunt        unknown type   Cancer Maternal Uncle        unknown type    Ovarian cancer Cousin    Breast cancer Cousin 74   Esophageal cancer Neg Hx    Stomach cancer Neg Hx    Rectal cancer Neg Hx    Colon polyps Neg Hx    Crohn's disease Neg Hx    Ulcerative colitis Neg Hx     Medications: Patient's Medications  New Prescriptions   No medications on file  Previous Medications   ASPIRIN 81 MG TABLET    Take 81 mg by mouth daily.   ATORVASTATIN  (LIPITOR) 40 MG TABLET    TAKE 1 TABLET BY MOUTH EVERYDAY AT BEDTIME   CALCIUM  CARB-CHOLECALCIFEROL (CALCIUM  CARBONATE+VITAMIN D  PO)    Take by mouth.   CHOLECALCIFEROL (VITAMIN D3 PO)    Take 2,000 Units by mouth daily.   CONTINUOUS GLUCOSE RECEIVER (FREESTYLE LIBRE 3 READER) DEVI    USE TO CHECK BLOOD SUGAR THREE TIMES DAILY. DX: E11.42   CONTINUOUS GLUCOSE SENSOR (FREESTYLE LIBRE 3 PLUS SENSOR) MISC    Change sensor every 15 days.   ENALAPRIL  (VASOTEC ) 5 MG TABLET    TAKE 1 TABLET BY MOUTH EVERY DAY   GLIPIZIDE  (GLUCOTROL  XL) 2.5 MG 24 HR TABLET    TAKE 1 TABLET BY MOUTH EVERY DAY WITH BREAKFAST   IBUPROFEN  (ADVIL ) 600 MG TABLET    Take 1 tablet (600 mg total) by mouth every 8 (eight) hours as needed.   METFORMIN  (GLUCOPHAGE ) 1000 MG TABLET    TAKE 1 TABLET (1,000 MG TOTAL) BY MOUTH TWICE A DAY WITH FOOD   SILDENAFIL  (VIAGRA ) 100 MG TABLET    0.5-1 tab 1 hour prior to intercourse   TAMSULOSIN  (FLOMAX ) 0.4 MG CAPS CAPSULE    Take 1 capsule (0.4 mg total) by mouth daily.   UNABLE TO FIND    Med Name: Freestyle Libra Plus check blood sugar three times daily  Modified Medications   No medications on file  Discontinued Medications   No medications on file    Physical Exam:  Vitals:   01/31/24 1144  BP: 138/80  Pulse: 70  Temp: 97.8 F (36.6 C)  SpO2: 97%  Weight: 254 lb 12.8 oz (115.6 kg)  Height: 6' 1 (1.854 m)   Body mass index is 33.62 kg/m. Wt Readings from Last 3 Encounters:  01/31/24 254 lb 12.8 oz (115.6 kg)  11/29/23 257 lb 6.4 oz (116.8 kg)  11/12/23 257 lb 6.4 oz (116.8 kg)    Physical  Exam Constitutional:      General: He is not in acute distress.    Appearance: He is well-developed. He is not diaphoretic.  HENT:     Head: Normocephalic and atraumatic.     Right Ear: External ear  normal.     Left Ear: External ear normal.     Mouth/Throat:     Pharynx: No oropharyngeal exudate.   Eyes:     Conjunctiva/sclera: Conjunctivae normal.     Pupils: Pupils are equal, round, and reactive to light.    Cardiovascular:     Rate and Rhythm: Normal rate and regular rhythm.     Heart sounds: Normal heart sounds.  Pulmonary:     Effort: Pulmonary effort is normal.     Breath sounds: Normal breath sounds.  Abdominal:     General: Bowel sounds are normal.     Palpations: Abdomen is soft.   Musculoskeletal:        General: No tenderness.     Cervical back: Normal range of motion and neck supple.     Right lower leg: No edema.     Left lower leg: No edema.   Skin:    General: Skin is warm and dry.   Neurological:     Mental Status: He is alert and oriented to person, place, and time.     Labs reviewed: Basic Metabolic Panel: Recent Labs    07/01/23 0933  NA 138  K 4.6  CL 106  CO2 24  GLUCOSE 138*  BUN 18  CREATININE 0.97  CALCIUM  9.5   Liver Function Tests: Recent Labs    07/01/23 0933  AST 24  ALT 29  BILITOT 0.8  PROT 7.0   No results for input(s): LIPASE, AMYLASE in the last 8760 hours. No results for input(s): AMMONIA in the last 8760 hours. CBC: Recent Labs    07/01/23 0933  WBC 3.3*  NEUTROABS 1,858  HGB 12.5*  HCT 38.3*  MCV 83.6  PLT 210   Lipid Panel: Recent Labs    07/01/23 0933  CHOL 118  HDL 35*  LDLCALC 62  TRIG 872  CHOLHDL 3.4   TSH: No results for input(s): TSH in the last 8760 hours. A1C: Lab Results  Component Value Date   HGBA1C 7.0 (H) 07/01/2023     Assessment/Plan Type 2 diabetes mellitus with diabetic polyneuropathy, without long-term current use of insulin (HCC) Assessment &  Plan: Encouraged dietary compliance, routine foot care/monitoring and to keep up with diabetic eye exams through ophthalmology  Continue current regimen Encouraged regular meals and to eat snack with complex carb and protein prior to bed if he had an early dinner  Orders: -     Hemoglobin A1c -     COMPLETE METABOLIC PANEL WITHOUT GFR -     CBC with Differential/Platelet  Essential hypertension Assessment & Plan: Blood pressure well controlled, goal bp <140/90 Continue current medications and dietary modifications follow metabolic panel  Orders: -     COMPLETE METABOLIC PANEL WITHOUT GFR -     CBC with Differential/Platelet  Hyperlipidemia, unspecified hyperlipidemia type Assessment & Plan: Continues on lipitor with dietary modifications LDL at goal on last labs  Follow yearly    History of prostate cancer Assessment & Plan: Currently completed treatment, continues to follow up with urology   Class 1 obesity due to excess calories with serious comorbidity and body mass index (BMI) of 33.0 to 33.9 in adult Assessment & Plan: -education provided on healthy weight loss through increase in physical activity and proper nutrition    Vitamin D  deficiency Assessment & Plan: Continues on supplement      Return in about 6 months (around 08/01/2024) for routine follow up, labs at time of visit.:  Ellyana Crigler K. Caro BODILY [facility] [phone]

## 2024-01-31 NOTE — Assessment & Plan Note (Signed)
Continues on supplement 

## 2024-01-31 NOTE — Assessment & Plan Note (Signed)
 Continues on lipitor with dietary modifications LDL at goal on last labs  Follow yearly

## 2024-01-31 NOTE — Assessment & Plan Note (Signed)
-  education provided on healthy weight loss through increase in physical activity and proper nutrition

## 2024-01-31 NOTE — Assessment & Plan Note (Signed)
 Currently completed treatment, continues to follow up with urology

## 2024-02-01 LAB — COMPREHENSIVE METABOLIC PANEL WITH GFR
AG Ratio: 2 (calc) (ref 1.0–2.5)
ALT: 31 U/L (ref 9–46)
AST: 24 U/L (ref 10–35)
Albumin: 4.5 g/dL (ref 3.6–5.1)
Alkaline phosphatase (APISO): 52 U/L (ref 35–144)
BUN: 21 mg/dL (ref 7–25)
CO2: 22 mmol/L (ref 20–32)
Calcium: 9.5 mg/dL (ref 8.6–10.3)
Chloride: 104 mmol/L (ref 98–110)
Creat: 0.9 mg/dL (ref 0.70–1.28)
Globulin: 2.2 g/dL (ref 1.9–3.7)
Glucose, Bld: 83 mg/dL (ref 65–139)
Potassium: 4.3 mmol/L (ref 3.5–5.3)
Sodium: 138 mmol/L (ref 135–146)
Total Bilirubin: 0.8 mg/dL (ref 0.2–1.2)
Total Protein: 6.7 g/dL (ref 6.1–8.1)
eGFR: 91 mL/min/{1.73_m2} (ref >=60)

## 2024-02-01 LAB — HEMOGLOBIN A1C
Hgb A1c MFr Bld: 6.9 % — ABNORMAL HIGH (ref <5.7)
Mean Plasma Glucose: 151 mg/dL
eAG (mmol/L): 8.4 mmol/L

## 2024-02-01 LAB — CBC WITH DIFFERENTIAL/PLATELET
Absolute Lymphocytes: 1078 {cells}/uL (ref 850–3900)
Absolute Monocytes: 494 {cells}/uL (ref 200–950)
Basophils Absolute: 11 {cells}/uL (ref 0–200)
Basophils Relative: 0.3 %
Eosinophils Absolute: 207 {cells}/uL (ref 15–500)
Eosinophils Relative: 5.9 %
HCT: 40 % (ref 38.5–50.0)
Hemoglobin: 12.8 g/dL — ABNORMAL LOW (ref 13.2–17.1)
MCH: 27.2 pg (ref 27.0–33.0)
MCHC: 32 g/dL (ref 32.0–36.0)
MCV: 85.1 fL (ref 80.0–100.0)
MPV: 10.3 fL (ref 7.5–12.5)
Monocytes Relative: 14.1 %
Neutro Abs: 1712 {cells}/uL (ref 1500–7800)
Neutrophils Relative %: 48.9 %
Platelets: 210 10*3/uL (ref 140–400)
RBC: 4.7 10*6/uL (ref 4.20–5.80)
RDW: 14.6 % (ref 11.0–15.0)
Total Lymphocyte: 30.8 %
WBC: 3.5 10*3/uL — ABNORMAL LOW (ref 3.8–10.8)

## 2024-02-03 ENCOUNTER — Ambulatory Visit: Payer: Self-pay | Admitting: Nurse Practitioner

## 2024-02-16 ENCOUNTER — Other Ambulatory Visit: Payer: Self-pay | Admitting: Nurse Practitioner

## 2024-02-28 ENCOUNTER — Ambulatory Visit: Admitting: Podiatry

## 2024-02-28 DIAGNOSIS — E1165 Type 2 diabetes mellitus with hyperglycemia: Secondary | ICD-10-CM

## 2024-02-28 DIAGNOSIS — B351 Tinea unguium: Secondary | ICD-10-CM

## 2024-02-28 DIAGNOSIS — M79675 Pain in left toe(s): Secondary | ICD-10-CM

## 2024-02-28 DIAGNOSIS — M79674 Pain in right toe(s): Secondary | ICD-10-CM

## 2024-03-02 ENCOUNTER — Encounter: Payer: Self-pay | Admitting: Podiatry

## 2024-03-02 NOTE — Progress Notes (Signed)
  Subjective:  Patient ID: Raymond [name], male    DOB: 11-13-1950,  MRN: [id number]  Raymond [name] presents to clinic today for preventative diabetic foot care and painful mycotic toenails of both feet that are difficult to trim. Pain interferes with daily activities and wearing enclosed shoe gear comfortably. He states his left great toe is discolored under the toenail. He denies any trauma to the digit. Chief Complaint  Patient presents with   Nail Problem    Thick painful toenails, 3 month follow up    New problem(s): None.   PCP is Caro Harlene POUR, NP.  No Known Allergies  Review of Systems: Negative except as noted in the HPI.  Objective:  There were no vitals filed for this visit. Raymond [name] is a pleasant 73 y.o. male in NAD. AAO x 3.  Vascular Examination: Vascular status intact b/l with palpable pedal pulses. CFT immediate b/l. No edema. No pain with calf compression b/l. Skin temperature gradient WNL b/l. No ischemia or gangrene noted b/l LE. No cyanosis or clubbing noted b/l LE.  Neurological Examination: Sensation grossly intact b/l with 10 gram monofilament. Vibratory sensation intact b/l.   Dermatological Examination: There is evidence of subacute subungual hematoma of the left great toe. Nailplate remains adhered. There is no  tenderness to palpation. Pedal skin with normal turgor, texture and tone b/l. Toenails right great toe and 2-5 b/l thick, discolored, elongated with subungual debris and pain on dorsal palpation. No hyperkeratotic lesions noted b/l.   Musculoskeletal Examination: Muscle strength 5/5 to b/l LE. No pain, crepitus or joint limitation noted with ROM bilateral LE. No gross bony deformities bilaterally. Patient ambulates independent of any assistive aids.  Radiographs: None  Assessment/Plan: 1. Pain due to onychomycosis of toenails of both feet   2. Type 2 diabetes mellitus with hyperglycemia, without long-term current use of insulin (HCC)    Patient was evaluated and treated. All patient's and/or POA's questions/concerns addressed on today's visit. Discussed subungual hematoma left great toe. Nailplate still adhered. Monitor. Toenails 1-5 debrided in length and girth without incident. Continue foot and shoe inspections daily. Monitor blood glucose per PCP/Endocrinologist's recommendations. Continue soft, supportive shoe gear daily. Report any pedal injuries to medical professional. Call office if there are any questions/concerns. -Patient/POA to call should there be question/concern in the interim.   Return in about 3 months (around 05/30/2024).  Raymond [name], DPM       LOCATION: [address], [place] [address]                   Office [phone]   [facility] LOCATION: [address] [place], [place] [address] Office [phone]

## 2024-03-04 ENCOUNTER — Other Ambulatory Visit: Payer: Self-pay | Admitting: Nurse Practitioner

## 2024-04-03 DIAGNOSIS — C61 Malignant neoplasm of prostate: Secondary | ICD-10-CM | POA: Diagnosis not present

## 2024-04-10 DIAGNOSIS — C61 Malignant neoplasm of prostate: Secondary | ICD-10-CM | POA: Diagnosis not present

## 2024-04-15 ENCOUNTER — Other Ambulatory Visit: Payer: Self-pay | Admitting: Nurse Practitioner

## 2024-04-15 DIAGNOSIS — E1142 Type 2 diabetes mellitus with diabetic polyneuropathy: Secondary | ICD-10-CM

## 2024-04-23 DIAGNOSIS — B349 Viral infection, unspecified: Secondary | ICD-10-CM | POA: Diagnosis not present

## 2024-04-23 DIAGNOSIS — U071 COVID-19: Secondary | ICD-10-CM | POA: Diagnosis not present

## 2024-05-04 ENCOUNTER — Ambulatory Visit (INDEPENDENT_AMBULATORY_CARE_PROVIDER_SITE_OTHER): Payer: PPO | Admitting: Nurse Practitioner

## 2024-05-04 ENCOUNTER — Encounter: Payer: Self-pay | Admitting: Nurse Practitioner

## 2024-05-04 VITALS — BP 136/82 | HR 86 | Temp 97.2°F | Ht 73.0 in | Wt 259.2 lb

## 2024-05-04 DIAGNOSIS — Z0000 Encounter for general adult medical examination without abnormal findings: Secondary | ICD-10-CM

## 2024-05-04 DIAGNOSIS — Z23 Encounter for immunization: Secondary | ICD-10-CM

## 2024-05-04 NOTE — Patient Instructions (Signed)
  Raymond [name] , Thank you for taking time to come for your Medicare Wellness Visit. I appreciate your ongoing commitment to your health goals. Please review the following plan we discussed and let me know if I can assist you in the future.    You are for TDAP, 2nd shingles vaccine and COVID booster- to get these at your local pharmacy     This is a list of the screening recommended for you and due dates:  Health Maintenance  Topic Date Due   Zoster (Shingles) Vaccine (2 of 2) 01/03/2023   DTaP/Tdap/Td vaccine (3 - Td or Tdap) 12/02/2023   COVID-19 Vaccine (6 - 2025-26 season) 04/06/2024   Yearly kidney health urinalysis for diabetes  06/30/2024   Hemoglobin A1C  08/01/2024   Eye exam for diabetics  09/26/2024   Yearly kidney function blood test for diabetes  01/30/2025   Complete foot exam   01/30/2025   Medicare Annual Wellness Visit  05/04/2025   Colon Cancer Screening  01/15/2026   Pneumococcal Vaccine for age over 17  Completed   Flu Shot  Completed   Hepatitis C Screening  Completed   HPV Vaccine  Aged Out   Meningitis B Vaccine  Aged Out

## 2024-05-04 NOTE — Progress Notes (Signed)
 Subjective:   Raymond [name] is a 73 y.o. male who presents for Medicare Annual/Subsequent preventive examination.  Visit Complete: In person [facility] clinic \  Cardiac Risk Factors include: advanced age (>60men, >60 women);obesity (BMI >30kg/m2);hypertension;diabetes mellitus;dyslipidemia;male gender     Objective:    Today's Vitals   05/04/24 1008  BP: 136/82  Pulse: 86  Temp: (!) 97.2 F (36.2 C)  SpO2: 99%  Weight: 259 lb 3.2 oz (117.6 kg)  Height: 6' 1 (1.854 m)   Body mass index is 34.2 kg/m.     05/04/2024   10:11 AM 11/12/2023    1:24 PM 04/29/2023   10:37 AM 12/24/2022    8:53 AM 06/25/2022    9:09 AM 04/19/2022    9:54 AM 12/25/2021    9:09 AM  Advanced Directives  Does Patient Have a Medical Advance Directive? Yes Yes No No No No No  Type of Estate agent of [place];Living will Healthcare Power of [place];Living will       Does patient want to make changes to medical advance directive? No - Patient declined No - Patient declined       Copy of Healthcare Power of Attorney in Chart? Yes - validated most recent copy scanned in chart (See row information) Yes - validated most recent copy scanned in chart (See row information)       Would patient like information on creating a medical advance directive?   Yes (MAU/Ambulatory/Procedural Areas - Information given) Yes (MAU/Ambulatory/Procedural Areas - Information given) Yes (MAU/Ambulatory/Procedural Areas - Information given) Yes (MAU/Ambulatory/Procedural Areas - Information given) Yes (MAU/Ambulatory/Procedural Areas - Information given)    Current Medications (verified) Outpatient Encounter Medications as of 05/04/2024  Medication Sig   aspirin 81 MG tablet Take 81 mg by mouth daily.   atorvastatin  (LIPITOR) 40 MG tablet TAKE 1 TABLET BY MOUTH EVERYDAY AT BEDTIME   Calcium  Carb-Cholecalciferol (CALCIUM  CARBONATE+VITAMIN D  PO) Take by mouth.   cetirizine (ZYRTEC) 10 MG tablet Take 10 mg by mouth  daily.   Cholecalciferol (VITAMIN D3 PO) Take 2,000 Units by mouth daily.   Continuous Glucose Receiver (FREESTYLE LIBRE 3 READER) DEVI USE TO CHECK BLOOD SUGAR THREE TIMES DAILY. DX: E11.42   Continuous Glucose Sensor (FREESTYLE LIBRE 3 PLUS SENSOR) MISC Change sensor every 15 days.   enalapril  (VASOTEC ) 5 MG tablet TAKE 1 TABLET BY MOUTH EVERY DAY   glipiZIDE  (GLUCOTROL  XL) 2.5 MG 24 hr tablet TAKE 1 TABLET BY MOUTH EVERY DAY WITH BREAKFAST   ibuprofen  (ADVIL ) 600 MG tablet Take 1 tablet (600 mg total) by mouth every 8 (eight) hours as needed.   metFORMIN  (GLUCOPHAGE ) 1000 MG tablet TAKE 1 TABLET (1,000 MG TOTAL) BY MOUTH TWICE A DAY WITH FOOD   sildenafil  (VIAGRA ) 100 MG tablet 0.5-1 tab 1 hour prior to intercourse   tamsulosin  (FLOMAX ) 0.4 MG CAPS capsule Take 1 capsule (0.4 mg total) by mouth daily.   UNABLE TO FIND Med Name: Freestyle Libra Plus check blood sugar three times daily   No facility-administered encounter medications on file as of 05/04/2024.    Allergies (verified) Patient has no known allergies.   History: Past Medical History:  Diagnosis Date   Allergy    Cataract    child,small ones developing   Diabetes mellitus without complication (HCC)    Edema    GERD (gastroesophageal reflux disease)    Hyperlipidemia    Hypertension    Obesity, unspecified    Problems with sexual function  Prostate cancer (HCC)    Sleep apnea    released per MD, no CPAP   Unspecified sleep apnea    Unspecified vitamin D  deficiency    Past Surgical History:  Procedure Laterality Date   CATARACT EXTRACTION  1964   Dr.Freed    COLONOSCOPY     HERNIA REPAIR  1987   Right Side, Dr.Kaufman    POLYPECTOMY     PROSTATE BIOPSY     radation markers     Family History  Problem Relation Age of Onset   Colon cancer Mother 35   Breast cancer Mother        dx. 40s   Lung cancer Father 15   Breast cancer Sister        dx. 50s   Ovarian cancer Sister        dx. 60s   Cancer  Maternal Aunt        unknown type   Cancer Maternal Uncle        unknown type   Ovarian cancer Cousin    Breast cancer Cousin 44   Esophageal cancer Neg Hx    Stomach cancer Neg Hx    Rectal cancer Neg Hx    Colon polyps Neg Hx    Crohn's disease Neg Hx    Ulcerative colitis Neg Hx    Social History   Socioeconomic History   Marital status: Married    Spouse name: Not on file   Number of children: 0   Years of education: Not on file   Highest education level: Associate degree: occupational, Scientist, product/process development, or vocational program  Occupational History   Not on file  Tobacco Use   Smoking status: Never   Smokeless tobacco: Never  Vaping Use   Vaping status: Never Used  Substance and Sexual Activity   Alcohol use: No   Drug use: No   Sexual activity: Yes  Other Topics Concern   Not on file  Social History Narrative   Not on file   Social Drivers of Health   Financial Resource Strain: Low Risk  (01/30/2024)   Overall Financial Resource Strain (CARDIA)    Difficulty of Paying Living Expenses: Not very hard  Food Insecurity: No Food Insecurity (05/04/2024)   Hunger Vital Sign    Worried About Running Out of Food in the Last Year: Never true    Ran Out of Food in the Last Year: Never true  Transportation Needs: No Transportation Needs (05/04/2024)   PRAPARE - Administrator, Civil Service (Medical): No    Lack of Transportation (Non-Medical): No  Physical Activity: Unknown (01/30/2024)   Exercise Vital Sign    Days of Exercise per Week: Patient declined    Minutes of Exercise per Session: Not on file  Stress: No Stress Concern Present (01/30/2024)   [employer] of Occupational Health - Occupational Stress Questionnaire    Feeling of Stress: Not at all  Social Connections: Moderately Integrated (01/30/2024)   Social Connection and Isolation Panel    Frequency of Communication with Friends and Family: Once a week    Frequency of Social Gatherings with Friends  and Family: Once a week    Attends Religious Services: More than 4 times per year    Active Member of [employer] or Organizations: Yes    Attends Engineer, structural: More than 4 times per year    Marital Status: Married    Tobacco Counseling Counseling given: Not Answered   Clinical Intake:  Pre-visit preparation completed: Yes  Pain : No/denies pain     BMI - recorded: 34 Nutritional Status: BMI > 30  Obese Nutritional Risks: None Diabetes: Yes  How often do you need to have someone help you when you read instructions, pamphlets, or other written materials from your doctor or pharmacy?: 1 - Never         Activities of Daily Living    05/04/2024   10:16 AM 05/03/2024    7:51 PM  In your present state of health, do you have any difficulty performing the following activities:  Hearing? 0 0  Vision? 0 0  Difficulty concentrating or making decisions? 0 0  Walking or climbing stairs? 0 0  Dressing or bathing? 0 0  Doing errands, shopping? 0 0  Preparing Food and eating ? N N  Using the Toilet? N N  In the past six months, have you accidently leaked urine? N N  Do you have problems with loss of bowel control? N N  Managing your Medications? N N  Managing your Finances? N N  Housekeeping or managing your Housekeeping? N N    Patient Care Team: Caro Harlene POUR, NP as PCP - General (Geriatric Medicine) Cleatus Collar, MD as Consulting Physician (Ophthalmology) Patrcia Cough, MD as Consulting Physician (Radiation Oncology) Renda Glance, MD as Consulting Physician (Urology) Crawford, Morna Pickle, NP as Nurse Practitioner (Hematology and Oncology) Starla Wendelyn BIRCH, RN as Registered Nurse  Indicate any recent Medical Services you may have received from other than [facility] providers in the past year (date may be approximate).     Assessment:   This is a routine wellness examination for [facility].  Hearing/Vision screen Vision Screening - Comments:: Cleatus  Eye Last Exam: 09/2023   Goals Addressed   None    Depression Screen    05/04/2024   10:12 AM 01/31/2024   11:49 AM 11/12/2023    1:23 PM 04/29/2023   10:36 AM 12/24/2022    8:51 AM 06/25/2022    9:09 AM 04/19/2022    9:53 AM  PHQ 2/9 Scores  PHQ - 2 Score 0 0 0 0 0 0 0    Fall Risk    05/04/2024   10:12 AM 05/03/2024    7:51 PM 01/31/2024   11:49 AM 11/12/2023    1:23 PM 04/29/2023   10:35 AM  Fall Risk   Falls in the past year? 0 0 0 1 1  Number falls in past yr: 0  0 0 0  Injury with Fall? 0  0 1 1  Comment     Rib injury  Risk for fall due to : No Fall Risks  No Fall Risks History of fall(s) History of fall(s)  Follow up Falls evaluation completed  Falls evaluation completed Falls evaluation completed Falls evaluation completed    MEDICARE RISK AT HOME: Medicare Risk at Home Any stairs in or around the home?: Yes If so, are there any without handrails?: Yes Home free of loose throw rugs in walkways, pet beds, electrical cords, etc?: Yes Adequate lighting in your home to reduce risk of falls?: Yes Life alert?: No Use of a cane, walker or w/c?: No Grab bars in the bathroom?: No Shower chair or bench in shower?: No Elevated toilet seat or a handicapped toilet?: No  TIMED UP AND GO:  Was the test performed?  No    Cognitive Function:    04/29/2023   10:46 AM 12/25/2019    9:49 AM 01/27/2018  11:16 AM  MMSE - Mini Mental State Exam  Orientation to time 5 5 5   Orientation to Place 5 5 5   Registration 3 3 3   Attention/ Calculation 5 5 5   Recall 2 2 3   Language- name 2 objects 2 2 2   Language- repeat 1 1 1   Language- follow 3 step command 3 3 3   Language- read & follow direction 1 1 1   Write a sentence 1 1 1   Copy design 1 1 1   Total score 29 29 30         05/04/2024   10:12 AM 04/19/2022    9:54 AM 04/13/2021    2:22 PM 12/15/2018    8:48 AM  6CIT Screen  What Year? 0 points 0 points 0 points 0 points  What month? 0 points 0 points 0 points 0 points  What  time? 0 points 0 points 0 points 0 points  Count back from 20 0 points 0 points 0 points 0 points  Months in reverse 0 points 0 points 0 points 0 points  Repeat phrase  2 points 2 points 0 points  Total Score  2 points 2 points 0 points    Immunizations Immunization History  Administered Date(s) Administered   Fluad Quad(high Dose 65+) 06/22/2019, 06/27/2020, 06/23/2021, 06/09/2022   Fluad Trivalent(High Dose 65+) 04/29/2023   INFLUENZA, HIGH DOSE SEASONAL PF 08/26/2017, 05/04/2024   Influenza,inj,Quad PF,6+ Mos 04/01/2013, 07/12/2014, 05/26/2015, 04/17/2016, 05/30/2018   PFIZER(Purple Top)SARS-COV-2 Vaccination 10/02/2019, 10/30/2019, 05/31/2020   Pfizer Covid-19 Vaccine Bivalent Booster 51yrs & up 05/29/2021   Pfizer(Comirnaty)Fall Seasonal Vaccine 12 years and older 06/09/2022   Pneumococcal Conjugate-13 07/19/2016   Pneumococcal Polysaccharide-23 09/20/2011, 01/27/2018   Td 08/06/2002   Tdap 12/01/2013   Zoster Recombinant(Shingrix) 11/08/2022   Zoster, Live 09/16/2012    TDAP status: Due, Education has been provided regarding the importance of this vaccine. Advised may receive this vaccine at local pharmacy or Health Dept. Aware to provide a copy of the vaccination record if obtained from local pharmacy or Health Dept. Verbalized acceptance and understanding.  Flu Vaccine status: Completed at today's visit  Pneumococcal vaccine status: Up to date  Covid-19 vaccine status: Information provided on how to obtain vaccines.   Qualifies for Shingles Vaccine? Yes   Zostavax completed No   Shingrix Completed?: No.    Education has been provided regarding the importance of this vaccine. Patient has been advised to call insurance company to determine out of pocket expense if they have not yet received this vaccine. Advised may also receive vaccine at local pharmacy or Health Dept. Verbalized acceptance and understanding.  Screening Tests Health Maintenance  Topic Date Due   Zoster  Vaccines- Shingrix (2 of 2) 01/03/2023   DTaP/Tdap/Td (3 - Td or Tdap) 12/02/2023   COVID-19 Vaccine (6 - 2025-26 season) 04/06/2024   Diabetic kidney evaluation - Urine ACR  06/30/2024   HEMOGLOBIN A1C  08/01/2024   OPHTHALMOLOGY EXAM  09/26/2024   Diabetic kidney evaluation - eGFR measurement  01/30/2025   FOOT EXAM  01/30/2025   Medicare Annual Wellness (AWV)  05/04/2025   Colonoscopy  01/15/2026   Pneumococcal Vaccine: 50+ Years  Completed   Influenza Vaccine  Completed   Hepatitis C Screening  Completed   HPV VACCINES  Aged Out   Meningococcal B Vaccine  Aged Out    Health Maintenance  Health Maintenance Due  Topic Date Due   Zoster Vaccines- Shingrix (2 of 2) 01/03/2023   DTaP/Tdap/Td (3 - Td or  Tdap) 12/02/2023   COVID-19 Vaccine (6 - 2025-26 season) 04/06/2024    Colorectal cancer screening: Type of screening: Colonoscopy. Completed 01/16/2023. Repeat every 3 years  Lung Cancer Screening: (Low Dose CT Chest recommended if Age 77-80 years, 20 pack-year currently smoking OR have quit w/in 15years.) does not qualify.   Lung Cancer Screening Referral: na  Additional Screening:  Hepatitis C Screening: does qualify; Completed   Vision Screening: Recommended annual ophthalmology exams for early detection of glaucoma and other disorders of the eye. Is the patient up to date with their annual eye exam?  Yes  Who is the provider or what is the name of the office in which the patient attends annual eye exams? 09/27/2023 If pt is not established with a provider, would they like to be referred to a provider to establish care? No .   Dental Screening: Recommended annual dental exams for proper oral hygiene  Diabetic Foot Exam: Diabetic Foot Exam: Completed 01/31/2024  Community Resource Referral / Chronic Care Management: CRR required this visit?  No   CCM required this visit?  No     Plan:     I have personally reviewed and noted the following in the patient's chart:    Medical and social history Use of alcohol, tobacco or illicit drugs  Current medications and supplements including opioid prescriptions. Patient is not currently taking opioid prescriptions. Functional ability and status Nutritional status Physical activity Advanced directives List of other physicians Hospitalizations, surgeries, and ER visits in previous 12 months Vitals Screenings to include cognitive, depression, and falls Referrals and appointments  In addition, I have reviewed and discussed with patient certain preventive protocols, quality metrics, and best practice recommendations. A written personalized care plan for preventive services as well as general preventive health recommendations were provided to patient.     Harlene MARLA An, NP   05/04/2024

## 2024-05-06 NOTE — Progress Notes (Signed)
 Raymond [name]                                          MRN: [id number]   05/06/2024   The VBCI Quality Team Specialist reviewed this patient medical record for the purposes of chart review for care gap closure. The following were reviewed: chart review for care gap closure-kidney health evaluation for diabetes:eGFR  and uACR.    VBCI Quality Team

## 2024-05-29 ENCOUNTER — Ambulatory Visit: Admitting: Podiatry

## 2024-05-29 DIAGNOSIS — M79674 Pain in right toe(s): Secondary | ICD-10-CM | POA: Diagnosis not present

## 2024-05-29 DIAGNOSIS — E1165 Type 2 diabetes mellitus with hyperglycemia: Secondary | ICD-10-CM

## 2024-05-29 DIAGNOSIS — B351 Tinea unguium: Secondary | ICD-10-CM | POA: Diagnosis not present

## 2024-05-29 DIAGNOSIS — M79675 Pain in left toe(s): Secondary | ICD-10-CM

## 2024-06-05 NOTE — Progress Notes (Signed)
  Subjective:  Patient ID: Raymond [name], male    DOB: [birth year],  MRN: [id number]  Raymond [name] presents to clinic today for preventative diabetic foot care for painful mycotic toenails x 10 which interfere with daily activities. Pain is relieved with periodic professional debridement.  Chief Complaint  Patient presents with   Toe Pain    Diabetic foot care. Raymond [name] Raymond is his PCP. A1c 6.9. Last visit was in Sept.    New problem(s): None.   PCP is Raymond Harlene POUR, Raymond [name].  No Known Allergies  Review of Systems: Negative except as noted in the HPI.  Objective: No changes noted in today's physical examination. There were no vitals filed for this visit. Raymond [name] is a pleasant 73 y.o. male in NAD. AAO x 3.  Vascular Examination: Vascular status intact b/l with palpable pedal pulses. CFT immediate b/l. No edema. No pain with calf compression b/l. Skin temperature gradient WNL b/l. No ischemia or gangrene noted b/l LE. No cyanosis or clubbing noted b/l LE.  Neurological Examination: Sensation grossly intact b/l with 10 gram monofilament. Vibratory sensation intact b/l.   Dermatological Examination: There is evidence of subacute subungual hematoma of the left great toe. Nailplate remains adhered. There is no  tenderness to palpation. Pedal skin with normal turgor, texture and tone b/l. Toenails right great toe and 2-5 b/l thick, discolored, elongated with subungual debris and pain on dorsal palpation. No hyperkeratotic lesions noted b/l.   Musculoskeletal Examination: Muscle strength 5/5 to b/l LE. No pain, crepitus or joint limitation noted with ROM bilateral LE. No gross bony deformities bilaterally. Patient ambulates independent of any assistive aids.  Radiographs: None  Assessment/Plan: 1. Pain due to onychomycosis of toenails of both feet   2. Type 2 diabetes mellitus with hyperglycemia, without long-term current use of insulin [facility])   Consent given for treatment. Patient  examined. All patient's and/or POA's questions/concerns addressed on today's visit. Mycotic toenails 1-5 b/l debrided in length and girth without incident. Continue foot and shoe inspections daily. Monitor blood glucose per PCP/Endocrinologist's recommendations.Continue soft, supportive shoe gear daily. Report any pedal injuries to medical professional. Call office if there are any quesitons/concerns. -Patient/POA to call should there be question/concern in the interim.   Return in about 3 months (around 08/29/2024).  Raymond [name], DPM      [facility] LOCATION: [address], [place] [address]                   Office [phone]   [facility] LOCATION: [address] [place], [place] [address] Office [phone]

## 2024-06-08 ENCOUNTER — Encounter: Payer: Self-pay | Admitting: Radiology

## 2024-06-22 DIAGNOSIS — J0110 Acute frontal sinusitis, unspecified: Secondary | ICD-10-CM | POA: Diagnosis not present

## 2024-06-22 DIAGNOSIS — R051 Acute cough: Secondary | ICD-10-CM | POA: Diagnosis not present

## 2024-06-22 DIAGNOSIS — Z20822 Contact with and (suspected) exposure to covid-19: Secondary | ICD-10-CM | POA: Diagnosis not present

## 2024-07-15 ENCOUNTER — Other Ambulatory Visit: Payer: Self-pay | Admitting: Nurse Practitioner

## 2024-07-15 DIAGNOSIS — E785 Hyperlipidemia, unspecified: Secondary | ICD-10-CM

## 2024-07-16 ENCOUNTER — Other Ambulatory Visit: Payer: Self-pay | Admitting: Nurse Practitioner

## 2024-08-03 ENCOUNTER — Encounter: Payer: Self-pay | Admitting: Nurse Practitioner

## 2024-08-03 ENCOUNTER — Ambulatory Visit: Payer: Self-pay | Admitting: Nurse Practitioner

## 2024-08-03 VITALS — BP 112/70 | HR 80 | Temp 97.5°F | Ht 73.0 in | Wt 259.2 lb

## 2024-08-03 DIAGNOSIS — I10 Essential (primary) hypertension: Secondary | ICD-10-CM | POA: Diagnosis not present

## 2024-08-03 DIAGNOSIS — E1142 Type 2 diabetes mellitus with diabetic polyneuropathy: Secondary | ICD-10-CM

## 2024-08-03 DIAGNOSIS — E785 Hyperlipidemia, unspecified: Secondary | ICD-10-CM

## 2024-08-03 DIAGNOSIS — Z8546 Personal history of malignant neoplasm of prostate: Secondary | ICD-10-CM

## 2024-08-03 DIAGNOSIS — N400 Enlarged prostate without lower urinary tract symptoms: Secondary | ICD-10-CM

## 2024-08-03 DIAGNOSIS — Z7984 Long term (current) use of oral hypoglycemic drugs: Secondary | ICD-10-CM

## 2024-08-03 DIAGNOSIS — E559 Vitamin D deficiency, unspecified: Secondary | ICD-10-CM | POA: Diagnosis not present

## 2024-08-03 MED ORDER — FREESTYLE LIBRE 3 PLUS SENSOR MISC: 5 refills | Status: AC

## 2024-08-03 NOTE — Progress Notes (Signed)
 `routi   Careteam: Patient Care Team: Caro Harlene POUR, NP as PCP - General (Geriatric Medicine) Cleatus Collar, MD as Consulting Physician (Ophthalmology) Patrcia Cough, MD as Consulting Physician (Radiation Oncology) Renda Glance, MD as Consulting Physician (Urology) Crawford, Morna Pickle, NP as Nurse Practitioner (Hematology and Oncology) Starla Wendelyn BIRCH, RN as Registered Nurse  PLACE OF SERVICE:  [facility]  Advanced Directive information    Allergies[1]  Chief Complaint  Patient presents with   mediication management of chronic issues.     Patient presents for routine 6 month follow up and labs. Care gaps have been discussed today patient is due for Shingles, Tdap, and covid which can all be obtained from local pharmacy.     HPI:  Discussed the use of AI scribe software for clinical note transcription with the patient, who gave verbal consent to proceed.  History of Present Illness Raymond [name] is a 73 year old male with diabetes, hypertension, hyperlipidemia, and a history of prostate cancer who presents for a six-month follow-up and lab work.  His diabetes management includes metformin  1000 mg twice daily with meals and glipizide  2.5 mg daily. He experiences occasional low blood sugar readings, with his glucose monitor alerting him at around 69 mg/dL, though he often does not feel symptomatic. He experiences mild warmth occasionally but no shakiness or sweating. He uses a continuous glucose monitor but notes issues with the last two sensors detaching prematurely, one during a shower and another possibly due to clothing. Refill sent to pharmacy today.   For hypertension, he takes enalapril  5 mg daily  He is on Lipitor for hyperlipidemia.  He has a history of prostate cancer and is under the care of a urologist, with recent levels being undetectable. He takes Flomax  for benign prostatic hyperplasia and reports no significant urinary symptoms. No significant  urinary urgency or other symptoms related to BPH.  He has a history of vitamin D  deficiency and is on a supplement, having increased his dose to two pills.   He mentions occasional knee pain for which he was prescribed ibuprofen  at an urgent care visit, but he does not take it regularly.     Review of Systems:  Review of Systems  Constitutional:  Negative for chills, fever and weight loss.  HENT:  Negative for tinnitus.   Respiratory:  Negative for cough, sputum production and shortness of breath.   Cardiovascular:  Negative for chest pain, palpitations and leg swelling.  Gastrointestinal:  Negative for abdominal pain, constipation, diarrhea and heartburn.  Genitourinary:  Negative for dysuria, frequency and urgency.  Musculoskeletal:  Negative for back pain, falls, joint pain and myalgias.  Skin: Negative.   Neurological:  Negative for dizziness and headaches.  Psychiatric/Behavioral:  Negative for depression and memory loss. The patient does not have insomnia.     Past Medical History:  Diagnosis Date   Allergy    Cataract    child,small ones developing   Diabetes mellitus without complication (HCC)    Edema    GERD (gastroesophageal reflux disease)    Hyperlipidemia    Hypertension    Obesity, unspecified    Problems with sexual function    Prostate cancer (HCC)    Sleep apnea    released per MD, no CPAP   Unspecified sleep apnea    Unspecified vitamin D  deficiency    Past Surgical History:  Procedure Laterality Date   CATARACT EXTRACTION  1964   Dr.Freed    COLONOSCOPY  HERNIA REPAIR  1987   Right Side, Dr.Kaufman    POLYPECTOMY     PROSTATE BIOPSY     radation markers     Social History:   reports that he has never smoked. He has never used smokeless tobacco. He reports that he does not drink alcohol and does not use drugs.  Family History  Problem Relation Age of Onset   Colon cancer Mother 20   Breast cancer Mother        dx. 40s   Lung cancer  Father 50   Breast cancer Sister        dx. 50s   Ovarian cancer Sister        dx. 60s   Cancer Maternal Aunt        unknown type   Cancer Maternal Uncle        unknown type   Ovarian cancer Cousin    Breast cancer Cousin 74   Esophageal cancer Neg Hx    Stomach cancer Neg Hx    Rectal cancer Neg Hx    Colon polyps Neg Hx    Crohn's disease Neg Hx    Ulcerative colitis Neg Hx     Medications: Patient's Medications  New Prescriptions   No medications on file  Previous Medications   ASPIRIN 81 MG TABLET    Take 81 mg by mouth daily.   ATORVASTATIN  (LIPITOR) 40 MG TABLET    TAKE 1 TABLET BY MOUTH EVERYDAY AT BEDTIME   CALCIUM  CARB-CHOLECALCIFEROL (CALCIUM  CARBONATE+VITAMIN D  PO)    Take by mouth.   CETIRIZINE (ZYRTEC) 10 MG TABLET    Take 10 mg by mouth daily.   CHOLECALCIFEROL (VITAMIN D3 PO)    Take 2,000 Units by mouth daily.   CONTINUOUS GLUCOSE RECEIVER (FREESTYLE LIBRE 3 READER) DEVI    USE TO CHECK BLOOD SUGAR THREE TIMES DAILY. DX: E11.42   CONTINUOUS GLUCOSE SENSOR (FREESTYLE LIBRE 3 PLUS SENSOR) MISC    CHANGE SENSOR EVERY 15 DAYS.   ENALAPRIL  (VASOTEC ) 5 MG TABLET    TAKE 1 TABLET BY MOUTH EVERY DAY   GLIPIZIDE  (GLUCOTROL  XL) 2.5 MG 24 HR TABLET    TAKE 1 TABLET BY MOUTH EVERY DAY WITH BREAKFAST   IBUPROFEN  (ADVIL ) 600 MG TABLET    Take 1 tablet (600 mg total) by mouth every 8 (eight) hours as needed.   METFORMIN  (GLUCOPHAGE ) 1000 MG TABLET    TAKE 1 TABLET (1,000 MG TOTAL) BY MOUTH TWICE A DAY WITH FOOD   SILDENAFIL  (VIAGRA ) 100 MG TABLET    0.5-1 tab 1 hour prior to intercourse   TAMSULOSIN  (FLOMAX ) 0.4 MG CAPS CAPSULE    Take 1 capsule (0.4 mg total) by mouth daily.   UNABLE TO FIND    Med Name: Freestyle Libra Plus check blood sugar three times daily  Modified Medications   No medications on file  Discontinued Medications   No medications on file    Physical Exam:  Vitals:   07/31/24 1341  BP: 112/70  Pulse: 80  Temp: (!) 97.5 F (36.4 C)  SpO2: 97%   Weight: 259 lb 3.2 oz (117.6 kg)  Height: 6' 1 (1.854 m)   Body mass index is 34.2 kg/m. Wt Readings from Last 3 Encounters:  07/31/24 259 lb 3.2 oz (117.6 kg)  05/04/24 259 lb 3.2 oz (117.6 kg)  01/31/24 254 lb 12.8 oz (115.6 kg)    Physical Exam Constitutional:      General: He is not in acute distress.  Appearance: He is well-developed. He is not diaphoretic.  HENT:     Head: Normocephalic and atraumatic.     Right Ear: External ear normal.     Left Ear: External ear normal.     Mouth/Throat:     Pharynx: No oropharyngeal exudate.  Eyes:     Conjunctiva/sclera: Conjunctivae normal.     Pupils: Pupils are equal, round, and reactive to light.  Cardiovascular:     Rate and Rhythm: Normal rate and regular rhythm.     Heart sounds: Normal heart sounds.  Pulmonary:     Effort: Pulmonary effort is normal.     Breath sounds: Normal breath sounds.  Abdominal:     General: Bowel sounds are normal.     Palpations: Abdomen is soft.  Musculoskeletal:        General: No tenderness.     Cervical back: Normal range of motion and neck supple.     Right lower leg: No edema.     Left lower leg: No edema.  Skin:    General: Skin is warm and dry.  Neurological:     Mental Status: He is alert and oriented to person, place, and time.     Labs reviewed: Basic Metabolic Panel: Recent Labs    01/31/24 1209  NA 138  K 4.3  CL 104  CO2 22  GLUCOSE 83  BUN 21  CREATININE 0.90  CALCIUM  9.5   Liver Function Tests: Recent Labs    01/31/24 1209  AST 24  ALT 31  BILITOT 0.8  PROT 6.7   No results for input(s): LIPASE, AMYLASE in the last 8760 hours. No results for input(s): AMMONIA in the last 8760 hours. CBC: Recent Labs    01/31/24 1209  WBC 3.5*  NEUTROABS 1,712  HGB 12.8*  HCT 40.0  MCV 85.1  PLT 210   Lipid Panel: No results for input(s): CHOL, HDL, LDLCALC, TRIG, CHOLHDL, LDLDIRECT in the last 8760 hours. TSH: No results for input(s):  TSH in the last 8760 hours. A1C: Lab Results  Component Value Date   HGBA1C 6.9 (H) 01/31/2024     Assessment/Plan Assessment and Plan Assessment & Plan Type 2 diabetes mellitus with diabetic polyneuropathy Diabetes managed with metformin  and glipizide .  Issues with continuous glucose monitor sensors detaching, refill sent to pharmacy  - Continue metformin  1000 mg twice daily with meals. - Continue glipizide  2.5 mg daily. - Ordered A1c test. - Sent refill for continuous glucose monitor sensors.  Essential hypertension Blood pressure controlled with enalapril , providing renal protection. - Continue enalapril  5 mg daily.  Hyperlipidemia Managed with Lipitor. Cholesterol check due. - Ordered cholesterol test.  Benign prostatic hyperplasia Managed with Flomax . No significant urinary symptoms. - Continue Flomax .  History of prostate cancer PSA undetectable. Under urologist care. No new symptoms - Continue follow-up with urologist.  Vitamin D  deficiency No recent vitamin D  level check. - Continue vitamin D  supplementation with two pills daily.    Return in about 6 months (around 02/01/2025) for routine follow up, labs at time of visit.:  Raudel Bazen K. Caro [facility]OD[facility] [phone]     [1] No Known Allergies

## 2024-08-04 ENCOUNTER — Ambulatory Visit: Payer: Self-pay | Admitting: Nurse Practitioner

## 2024-08-04 LAB — COMPREHENSIVE METABOLIC PANEL WITH GFR
AG Ratio: 2 (calc) (ref 1.0–2.5)
ALT: 30 U/L (ref 9–46)
AST: 20 U/L (ref 10–35)
Albumin: 4.2 g/dL (ref 3.6–5.1)
Alkaline phosphatase (APISO): 51 U/L (ref 35–144)
BUN: 17 mg/dL (ref 7–25)
CO2: 23 mmol/L (ref 20–32)
Calcium: 9.1 mg/dL (ref 8.6–10.3)
Chloride: 106 mmol/L (ref 98–110)
Creat: 0.99 mg/dL (ref 0.70–1.28)
Globulin: 2.1 g/dL (ref 1.9–3.7)
Glucose, Bld: 167 mg/dL — ABNORMAL HIGH (ref 65–139)
Potassium: 4.5 mmol/L (ref 3.5–5.3)
Sodium: 137 mmol/L (ref 135–146)
Total Bilirubin: 0.8 mg/dL (ref 0.2–1.2)
Total Protein: 6.3 g/dL (ref 6.1–8.1)
eGFR: 81 mL/min/1.73m2

## 2024-08-04 LAB — CBC WITH DIFFERENTIAL/PLATELET
Absolute Lymphocytes: 937 {cells}/uL (ref 850–3900)
Absolute Monocytes: 370 {cells}/uL (ref 200–950)
Basophils Absolute: 10 {cells}/uL (ref 0–200)
Basophils Relative: 0.3 %
Eosinophils Absolute: 129 {cells}/uL (ref 15–500)
Eosinophils Relative: 3.9 %
HCT: 38.7 % — ABNORMAL LOW (ref 39.4–51.1)
Hemoglobin: 12.5 g/dL — ABNORMAL LOW (ref 13.2–17.1)
MCH: 26.5 pg — ABNORMAL LOW (ref 27.0–33.0)
MCHC: 32.3 g/dL (ref 31.6–35.4)
MCV: 82.2 fL (ref 81.4–101.7)
MPV: 11 fL (ref 7.5–12.5)
Monocytes Relative: 11.2 %
Neutro Abs: 1855 {cells}/uL (ref 1500–7800)
Neutrophils Relative %: 56.2 %
Platelets: 212 Thousand/uL (ref 140–400)
RBC: 4.71 Million/uL (ref 4.20–5.80)
RDW: 14.3 % (ref 11.0–15.0)
Total Lymphocyte: 28.4 %
WBC: 3.3 Thousand/uL — ABNORMAL LOW (ref 3.8–10.8)

## 2024-08-04 LAB — HEMOGLOBIN A1C
Hgb A1c MFr Bld: 7.3 % — ABNORMAL HIGH
Mean Plasma Glucose: 163 mg/dL
eAG (mmol/L): 9 mmol/L

## 2024-08-04 LAB — LIPID PANEL
Cholesterol: 104 mg/dL
HDL: 30 mg/dL — ABNORMAL LOW
LDL Cholesterol (Calc): 48 mg/dL
Non-HDL Cholesterol (Calc): 74 mg/dL
Total CHOL/HDL Ratio: 3.5 (calc)
Triglycerides: 180 mg/dL — ABNORMAL HIGH

## 2024-08-04 LAB — MICROALBUMIN / CREATININE URINE RATIO
Creatinine, Urine: 116 mg/dL (ref 20–320)
Microalb Creat Ratio: 13 mg/g{creat}
Microalb, Ur: 1.5 mg/dL

## 2024-08-04 LAB — VITAMIN D 25 HYDROXY (VIT D DEFICIENCY, FRACTURES): Vit D, 25-Hydroxy: 49 ng/mL (ref 30–100)

## 2024-08-20 ENCOUNTER — Other Ambulatory Visit: Payer: Self-pay | Admitting: Nurse Practitioner

## 2024-08-20 NOTE — Telephone Encounter (Signed)
 Medium risk warning populated when attempting to refill medications, will send to provider for pending  review and approval

## 2024-09-11 ENCOUNTER — Encounter: Payer: Self-pay | Admitting: Podiatry

## 2024-09-11 ENCOUNTER — Ambulatory Visit: Admitting: Podiatry

## 2024-12-11 ENCOUNTER — Ambulatory Visit: Admitting: Podiatry

## 2025-02-01 ENCOUNTER — Ambulatory Visit: Admitting: Nurse Practitioner

## 2025-02-01 ENCOUNTER — Other Ambulatory Visit

## 2025-05-07 ENCOUNTER — Ambulatory Visit: Payer: Self-pay | Admitting: Nurse Practitioner
